# Patient Record
Sex: Male | Born: 1985 | Race: Black or African American | Hispanic: No | Marital: Single | Smoking: Current every day smoker
Health system: Southern US, Community
[De-identification: ages and names within clinical notes are randomized; demographics above are authoritative.]

## PROBLEM LIST (undated history)

## (undated) ENCOUNTER — Telehealth: Attending: Infectious Disease | Primary: Infectious Disease

## (undated) ENCOUNTER — Encounter: Attending: Family | Primary: Family

## (undated) ENCOUNTER — Ambulatory Visit: Payer: Medicaid (Managed Care)

## (undated) ENCOUNTER — Encounter

## (undated) ENCOUNTER — Telehealth: Attending: Clinical | Primary: Clinical

## (undated) ENCOUNTER — Encounter: Payer: BLUE CROSS/BLUE SHIELD | Attending: General Practice | Primary: General Practice

## (undated) ENCOUNTER — Encounter: Attending: Infectious Disease | Primary: Infectious Disease

## (undated) ENCOUNTER — Telehealth

## (undated) ENCOUNTER — Ambulatory Visit

## (undated) ENCOUNTER — Encounter: Attending: Pain Medicine | Primary: Pain Medicine

## (undated) ENCOUNTER — Encounter: Payer: BLUE CROSS/BLUE SHIELD | Attending: Infectious Disease | Primary: Infectious Disease

## (undated) ENCOUNTER — Ambulatory Visit: Payer: BLUE CROSS/BLUE SHIELD

## (undated) ENCOUNTER — Encounter: Payer: Medicaid (Managed Care) | Attending: Family | Primary: Family

## (undated) ENCOUNTER — Telehealth: Attending: Registered" | Primary: Registered"

## (undated) ENCOUNTER — Ambulatory Visit: Payer: BLUE CROSS/BLUE SHIELD | Attending: Infectious Disease | Primary: Infectious Disease

## (undated) ENCOUNTER — Encounter: Attending: Registered" | Primary: Registered"

## (undated) ENCOUNTER — Encounter: Payer: BLUE CROSS/BLUE SHIELD | Attending: Psychologist | Primary: Psychologist

## (undated) DIAGNOSIS — K219 Gastro-esophageal reflux disease without esophagitis: Secondary | ICD-10-CM

## (undated) DIAGNOSIS — F17210 Nicotine dependence, cigarettes, uncomplicated: Secondary | ICD-10-CM

## (undated) DIAGNOSIS — B20 Human immunodeficiency virus [HIV] disease: Secondary | ICD-10-CM

## (undated) DIAGNOSIS — R768 Other specified abnormal immunological findings in serum: Secondary | ICD-10-CM

## (undated) DIAGNOSIS — M222X9 Patellofemoral disorders, unspecified knee: Secondary | ICD-10-CM

## (undated) DIAGNOSIS — M3500 Sicca syndrome, unspecified: Secondary | ICD-10-CM

## (undated) DIAGNOSIS — G894 Chronic pain syndrome: Secondary | ICD-10-CM

## (undated) DIAGNOSIS — M5020 Other cervical disc displacement, unspecified cervical region: Secondary | ICD-10-CM

## (undated) DIAGNOSIS — G629 Polyneuropathy, unspecified: Secondary | ICD-10-CM

## (undated) HISTORY — DX: Sjogren syndrome, unspecified: M35.00

## (undated) HISTORY — PX: BACK SURGERY: SHX140

---
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
## 2009-06-22 ENCOUNTER — Emergency Department (HOSPITAL_COMMUNITY): Admission: EM | Admit: 2009-06-22 | Discharge: 2009-06-22 | Payer: Self-pay | Admitting: Emergency Medicine

## 2009-10-18 ENCOUNTER — Emergency Department (HOSPITAL_COMMUNITY): Admission: EM | Admit: 2009-10-18 | Discharge: 2009-10-18 | Payer: Self-pay | Admitting: Emergency Medicine

## 2009-11-28 ENCOUNTER — Emergency Department (HOSPITAL_COMMUNITY): Admission: EM | Admit: 2009-11-28 | Discharge: 2009-11-28 | Payer: Self-pay | Admitting: Emergency Medicine

## 2010-10-29 ENCOUNTER — Emergency Department (HOSPITAL_COMMUNITY)
Admission: EM | Admit: 2010-10-29 | Discharge: 2010-10-30 | Disposition: A | Discharge status: Home / Self Care | Payer: Self-pay | Attending: Emergency Medicine | Admitting: Emergency Medicine

## 2010-10-29 DIAGNOSIS — H53149 Visual discomfort, unspecified: Secondary | ICD-10-CM | POA: Insufficient documentation

## 2010-10-29 DIAGNOSIS — S058X9A Other injuries of unspecified eye and orbit, initial encounter: Secondary | ICD-10-CM | POA: Insufficient documentation

## 2010-10-29 DIAGNOSIS — X58XXXA Exposure to other specified factors, initial encounter: Secondary | ICD-10-CM | POA: Insufficient documentation

## 2010-10-29 DIAGNOSIS — H5710 Ocular pain, unspecified eye: Secondary | ICD-10-CM | POA: Insufficient documentation

## 2011-01-28 ENCOUNTER — Emergency Department (HOSPITAL_COMMUNITY)
Admission: EM | Admit: 2011-01-28 | Discharge: 2011-01-28 | Disposition: A | Discharge status: Home / Self Care | Payer: Self-pay | Attending: Emergency Medicine | Admitting: Emergency Medicine

## 2011-01-28 DIAGNOSIS — M542 Cervicalgia: Secondary | ICD-10-CM | POA: Insufficient documentation

## 2011-01-28 DIAGNOSIS — X58XXXA Exposure to other specified factors, initial encounter: Secondary | ICD-10-CM | POA: Insufficient documentation

## 2011-01-28 DIAGNOSIS — Y93K9 Activity, other involving animal care: Secondary | ICD-10-CM | POA: Insufficient documentation

## 2011-01-28 DIAGNOSIS — S139XXA Sprain of joints and ligaments of unspecified parts of neck, initial encounter: Secondary | ICD-10-CM | POA: Insufficient documentation

## 2011-02-07 ENCOUNTER — Emergency Department (HOSPITAL_COMMUNITY)
Admission: EM | Admit: 2011-02-07 | Discharge: 2011-02-07 | Disposition: A | Discharge status: Home / Self Care | Payer: Self-pay | Attending: Emergency Medicine | Admitting: Emergency Medicine

## 2011-02-07 DIAGNOSIS — S139XXA Sprain of joints and ligaments of unspecified parts of neck, initial encounter: Secondary | ICD-10-CM | POA: Insufficient documentation

## 2011-02-07 DIAGNOSIS — M542 Cervicalgia: Secondary | ICD-10-CM | POA: Insufficient documentation

## 2011-02-07 DIAGNOSIS — X58XXXA Exposure to other specified factors, initial encounter: Secondary | ICD-10-CM | POA: Insufficient documentation

## 2011-02-26 ENCOUNTER — Inpatient Hospital Stay (INDEPENDENT_AMBULATORY_CARE_PROVIDER_SITE_OTHER)
Admission: RE | Admit: 2011-02-26 | Discharge: 2011-02-26 | Disposition: A | Discharge status: Home / Self Care | Payer: Self-pay | Source: Ambulatory Visit | Attending: Family Medicine | Admitting: Family Medicine

## 2011-02-26 DIAGNOSIS — K5289 Other specified noninfective gastroenteritis and colitis: Secondary | ICD-10-CM

## 2011-03-07 ENCOUNTER — Inpatient Hospital Stay (INDEPENDENT_AMBULATORY_CARE_PROVIDER_SITE_OTHER)
Admission: RE | Admit: 2011-03-07 | Discharge: 2011-03-07 | Disposition: A | Discharge status: Home / Self Care | Payer: Self-pay | Source: Ambulatory Visit | Attending: Family Medicine | Admitting: Family Medicine

## 2011-03-07 DIAGNOSIS — K069 Disorder of gingiva and edentulous alveolar ridge, unspecified: Secondary | ICD-10-CM

## 2011-03-07 DIAGNOSIS — J069 Acute upper respiratory infection, unspecified: Secondary | ICD-10-CM

## 2011-03-09 ENCOUNTER — Emergency Department (HOSPITAL_COMMUNITY): Payer: Self-pay

## 2011-03-09 ENCOUNTER — Emergency Department (HOSPITAL_COMMUNITY)
Admission: EM | Admit: 2011-03-09 | Discharge: 2011-03-09 | Disposition: A | Discharge status: Home / Self Care | Payer: Self-pay | Attending: Internal Medicine | Admitting: Internal Medicine

## 2011-03-09 DIAGNOSIS — R5383 Other fatigue: Secondary | ICD-10-CM | POA: Insufficient documentation

## 2011-03-09 DIAGNOSIS — R059 Cough, unspecified: Secondary | ICD-10-CM | POA: Insufficient documentation

## 2011-03-09 DIAGNOSIS — R509 Fever, unspecified: Secondary | ICD-10-CM | POA: Insufficient documentation

## 2011-03-09 DIAGNOSIS — R51 Headache: Secondary | ICD-10-CM | POA: Insufficient documentation

## 2011-03-09 DIAGNOSIS — R5381 Other malaise: Secondary | ICD-10-CM | POA: Insufficient documentation

## 2011-03-09 DIAGNOSIS — B9789 Other viral agents as the cause of diseases classified elsewhere: Secondary | ICD-10-CM | POA: Insufficient documentation

## 2011-03-09 DIAGNOSIS — R05 Cough: Secondary | ICD-10-CM | POA: Insufficient documentation

## 2011-03-09 LAB — DIFFERENTIAL
Band Neutrophils: 0 % (ref 0–10)
Basophils Absolute: 0 10*3/uL (ref 0.0–0.1)
Basophils Relative: 0 % (ref 0–1)
Blasts: 0 %
Eosinophils Absolute: 0 10*3/uL (ref 0.0–0.7)
Eosinophils Relative: 0 % (ref 0–5)
Lymphs Abs: 4.4 10*3/uL — ABNORMAL HIGH (ref 0.7–4.0)
Metamyelocytes Relative: 0 %
Monocytes Absolute: 1 10*3/uL (ref 0.1–1.0)
Monocytes Relative: 12 % (ref 3–12)

## 2011-03-09 LAB — BASIC METABOLIC PANEL
Calcium: 8.4 mg/dL (ref 8.4–10.5)
GFR calc Af Amer: >90 mL/min (ref >90)
GFR calc non Af Amer: >90 mL/min (ref >90)
Potassium: 3.9 mEq/L (ref 3.5–5.1)
Sodium: 133 mEq/L — ABNORMAL LOW (ref 135–145)

## 2011-03-09 LAB — URINALYSIS, ROUTINE W REFLEX MICROSCOPIC
Hgb urine dipstick: NEGATIVE
Ketones, ur: NEGATIVE mg/dL
Protein, ur: NEGATIVE mg/dL
Urobilinogen, UA: 1 mg/dL (ref 0.0–1.0)

## 2011-03-09 LAB — POCT I-STAT TROPONIN I: Troponin i, poc: 0 ng/mL (ref 0.00–0.08)

## 2011-03-09 LAB — CBC
MCH: 25.9 pg — ABNORMAL LOW (ref 26.0–34.0)
MCHC: 33.2 g/dL (ref 30.0–36.0)
Platelets: 220 10*3/uL (ref 150–400)
RDW: 13.3 % (ref 11.5–15.5)

## 2011-05-31 DIAGNOSIS — B20 Human immunodeficiency virus [HIV] disease: Secondary | ICD-10-CM

## 2011-05-31 HISTORY — DX: Human immunodeficiency virus (HIV) disease: B20

## 2011-10-25 DIAGNOSIS — B20 Human immunodeficiency virus [HIV] disease: Secondary | ICD-10-CM | POA: Insufficient documentation

## 2012-01-20 DIAGNOSIS — M25569 Pain in unspecified knee: Secondary | ICD-10-CM | POA: Insufficient documentation

## 2012-05-25 ENCOUNTER — Emergency Department (HOSPITAL_COMMUNITY)
Admission: EM | Admit: 2012-05-25 | Discharge: 2012-05-25 | Disposition: A | Discharge status: Home / Self Care | Payer: Self-pay | Attending: Emergency Medicine | Admitting: Emergency Medicine

## 2012-05-25 ENCOUNTER — Encounter (HOSPITAL_COMMUNITY): Payer: Self-pay | Admitting: Emergency Medicine

## 2012-05-25 DIAGNOSIS — K029 Dental caries, unspecified: Secondary | ICD-10-CM | POA: Insufficient documentation

## 2012-05-25 DIAGNOSIS — F17200 Nicotine dependence, unspecified, uncomplicated: Secondary | ICD-10-CM | POA: Insufficient documentation

## 2012-05-25 DIAGNOSIS — K1370 Unspecified lesions of oral mucosa: Secondary | ICD-10-CM | POA: Insufficient documentation

## 2012-05-25 DIAGNOSIS — Z8739 Personal history of other diseases of the musculoskeletal system and connective tissue: Secondary | ICD-10-CM | POA: Insufficient documentation

## 2012-05-25 DIAGNOSIS — K1379 Other lesions of oral mucosa: Secondary | ICD-10-CM

## 2012-05-25 DIAGNOSIS — B20 Human immunodeficiency virus [HIV] disease: Secondary | ICD-10-CM | POA: Insufficient documentation

## 2012-05-25 HISTORY — DX: Patellofemoral disorders, unspecified knee: M22.2X9

## 2012-05-25 HISTORY — DX: Human immunodeficiency virus (HIV) disease: B20

## 2012-05-25 MED ORDER — IBUPROFEN 200 MG PO TABS
600.0000 mg | ORAL_TABLET | Freq: Once | ORAL | Status: AC
  Administered 2012-05-25: 600 mg via ORAL
  Filled 2012-05-25: qty 3

## 2012-05-25 MED ORDER — CHLORHEXIDINE GLUCONATE 0.12 % MT SOLN: 15.0000 mL | Freq: Two times a day (BID) | OROMUCOSAL | Status: DC

## 2012-05-25 MED ORDER — AMOXICILLIN 500 MG PO CAPS: 500.0000 mg | ORAL_CAPSULE | Freq: Three times a day (TID) | ORAL | Status: DC

## 2012-05-25 MED ORDER — IBUPROFEN 600 MG PO TABS: 600.0000 mg | ORAL_TABLET | Freq: Four times a day (QID) | ORAL | Status: DC | PRN

## 2012-05-25 NOTE — ED Notes (Signed)
Pt c/o gum burning, swelling x 2 weeks. Pt states he has had this in past was tx for inflammation and infection. No open lesions at this time.

## 2012-05-25 NOTE — ED Provider Notes (Signed)
History     CSN: [id number]  Arrival date & time 05/25/12  2044   First MD Initiated Contact with Patient 05/25/12 2249      Chief Complaint  Patient presents with  . Mouth Lesions    (Consider location/radiation/quality/duration/timing/severity/associated sxs/prior treatment) HPI Comments: Patient with history of HIV, on HAART, last CD4 count per the patient greater than 500 -- presents with complaint of burning gums and mild gingival swelling for the past week. Patient states had this twice in the past and has been prescribed amoxicillin, ibuprofen, and mouthwash which has helped his symptoms. His last episode was 4 months ago he was seen by a dentist at that time. Patient denies fever, trouble swallowing, neck swelling. Onset gradual. Course is constant. Nothing makes symptoms better or worse. No treatments prior to arrival  The history is provided by the patient.    Past Medical History  Diagnosis Date  . HIV disease   . Patella-femoral syndrome     History reviewed. No pertinent past surgical history.  No family history on file.  History  Substance Use Topics  . Smoking status: Current Every Day Smoker  . Smokeless tobacco: Not on file  . Alcohol Use: Yes     Comment: occasional      Review of Systems  Constitutional: Negative for fever.  HENT: Positive for dental problem. Negative for ear pain, sore throat, facial swelling, trouble swallowing and neck pain.   Respiratory: Negative for shortness of breath and stridor.   Skin: Negative for color change.  Neurological: Negative for headaches.    Allergies  Review of patient's allergies indicates no known allergies.  Home Medications   Current Outpatient Rx  Name  Route  Sig  Dispense  Refill  . ELVITEG-COBICIS-EMTRICIT-TENOF 150-150-200-300 MG PO TABS   Oral   Take 1 tablet by mouth at bedtime.           BP 138/65  Pulse 89  Temp 99.5 F (37.5 C) (Oral)  Resp 18  Ht 5\' 9"  (1.753 m)  Wt 123 lb  (55.792 kg)  BMI 18.16 kg/m2  SpO2 100%  Physical Exam  Nursing note and vitals reviewed. Constitutional: He appears well-developed and well-nourished.  HENT:  Head: Normocephalic and atraumatic. No trismus in the jaw.  Right Ear: Tympanic membrane, external ear and ear canal normal.  Left Ear: Tympanic membrane, external ear and ear canal normal.  Nose: Nose normal.  Mouth/Throat: Uvula is midline, oropharynx is clear and moist and mucous membranes are normal. Abnormal dentition. Dental caries present. No dental abscesses or uvula swelling. No tonsillar abscesses.       No swelling noted on exam. Mild gingival erythema generalized.  Eyes: Pupils are equal, round, and reactive to light.  Neck: Normal range of motion. Neck supple.       No neck swelling or Lugwig's angina  Neurological: He is alert.  Skin: Skin is warm and dry.  Psychiatric: He has a normal mood and affect.    ED Course  Procedures (including critical care time)  Labs Reviewed - No data to display No results found.   1. Mouth pain     11:07 PM Patient seen and examined. Medications ordered.   Vital signs reviewed and are as follows: Filed Vitals:   05/25/12 2116  BP: 138/65  Pulse: 89  Temp: 99.5 F (37.5 C)  Resp: 18   Urged dental f/u.   Patient urged to return with worsening symptoms or other concerns. Patient verbalized  understanding and agrees with plan.   MDM  Patient with gum pain.  CD4 count per patient is >500. No gross abscess.  Exam unconcerning for Ludwig's angina or other deep tissue infection in neck.  Will treat with amox and NSAID as this has helped before.  Urged patient to follow-up with dentist.           Renne Crigler, PA 05/25/12 2324

## 2012-05-26 NOTE — ED Provider Notes (Signed)
Medical screening examination/treatment/procedure(s) were performed by non-physician practitioner and as supervising physician I was immediately available for consultation/collaboration.    Starsky Nanna L Jarvis Knodel, MD 05/26/12 1509 

## 2012-09-01 ENCOUNTER — Emergency Department (HOSPITAL_COMMUNITY)
Admission: EM | Admit: 2012-09-01 | Discharge: 2012-09-01 | Disposition: A | Discharge status: Home / Self Care | Payer: Self-pay | Attending: Emergency Medicine | Admitting: Emergency Medicine

## 2012-09-01 ENCOUNTER — Encounter (HOSPITAL_COMMUNITY): Payer: Self-pay | Admitting: Emergency Medicine

## 2012-09-01 DIAGNOSIS — F17200 Nicotine dependence, unspecified, uncomplicated: Secondary | ICD-10-CM | POA: Insufficient documentation

## 2012-09-01 DIAGNOSIS — Z79899 Other long term (current) drug therapy: Secondary | ICD-10-CM | POA: Insufficient documentation

## 2012-09-01 DIAGNOSIS — Z8739 Personal history of other diseases of the musculoskeletal system and connective tissue: Secondary | ICD-10-CM | POA: Insufficient documentation

## 2012-09-01 DIAGNOSIS — Z21 Asymptomatic human immunodeficiency virus [HIV] infection status: Secondary | ICD-10-CM | POA: Insufficient documentation

## 2012-09-01 DIAGNOSIS — L84 Corns and callosities: Secondary | ICD-10-CM | POA: Insufficient documentation

## 2012-09-01 NOTE — ED Notes (Signed)
Pain on bottom of 1st toe r/foot

## 2012-09-01 NOTE — ED Provider Notes (Signed)
History    This chart was scribed for non-physician practitioner working with Nelia Shi, MD by Frederik Pear, ED Scribe. This patient was seen in room WTR9/WTR9 and the patient's care was started at 1603.   CSN: [id number]  Arrival date & time 09/01/12  1446   First MD Initiated Contact with Patient 09/01/12 1603      Chief Complaint  Patient presents with  . Toe Pain    (Consider location/radiation/quality/duration/timing/severity/associated sxs/prior treatment) The history is provided by the patient and medical records. No language interpreter was used.    Cody Villarreal is a 27 y.o. male who presents to the Emergency Department complaining of gradual onset, gradually worsening, burning, non-radiating right great toe pain that is aggravated by walking and alleviated by nothing that began more than a month ago. He denies any drainage to the area. He reports that he was told that he had a plantar's wart on his foot and used Dr. Claude Manges Away twice and callous pads with no relief. He has a h/o of HIV disease.   Past Medical History  Diagnosis Date  . HIV disease   . Patella-femoral syndrome     No past surgical history on file.  No family history on file.  History  Substance Use Topics  . Smoking status: Current Every Day Smoker  . Smokeless tobacco: Not on file  . Alcohol Use: Yes     Comment: occasional      Review of Systems  Constitutional: Negative for fever.  Respiratory: Negative for shortness of breath.   Gastrointestinal: Negative for nausea and vomiting.  Musculoskeletal:       Toe pain  Skin: Negative for color change and wound.    Allergies  Review of patient's allergies indicates no known allergies.  Home Medications   Current Outpatient Rx  Name  Route  Sig  Dispense  Refill  . elvitegravir-cobicistat-emtricitabine-tenofovir (STRIBILD) 150-150-200-300 MG TABS   Oral   Take 1 tablet by mouth at bedtime.           BP 111/72   Pulse 79  Temp(Src) 99.1 F (37.3 C) (Oral)  Resp 15  SpO2 100%  Physical Exam  Nursing note and vitals reviewed. Constitutional: He appears well-developed and well-nourished.  HENT:  Head: Normocephalic and atraumatic.  Neck: Normal range of motion. Neck supple.  Cardiovascular: Normal rate.   Pulmonary/Chest: Effort normal and breath sounds normal. No respiratory distress. He has no wheezes. He has no rales. He exhibits no tenderness.  Abdominal: There is no tenderness.  Musculoskeletal: Normal range of motion.  Neurological: He is alert. Coordination normal.  Skin: No erythema.  Thick calloused skin on the proximal side of the right great toe. No erythema, drainage, or signs of infection.  Psychiatric: He has a normal mood and affect. His behavior is normal. Judgment and thought content normal.    ED Course  Procedures (including critical care time)  DIAGNOSTIC STUDIES: Oxygen Saturation is 100% on room air, normal by my interpretation.    COORDINATION OF CARE:  16:20- Discussed planned course of treatment with the patient, including following up with a podiatrist, who is agreeable at this time.  Labs Reviewed - No data to display No results found.   1. Corn of toe     Patient seen and examined. Podiatry referrals given. Patient counseled on use of OTC medications.  Vital signs reviewed and are as follows: Filed Vitals:   09/01/12 1525  BP: 111/72  Pulse: 79  Temp: 99.1 F (37.3 C)  Resp: 15   Patient urged to return with worsening symptoms or other concerns. Patient verbalized understanding and agrees with plan.      MDM  Patient with corn of toe. No infection noted. Conservative management indicated with podiatry followup.  I personally performed the services described in this documentation, which was scribed in my presence. The recorded information has been reviewed and is accurate.        Renne Crigler, PA-C 09/01/12 [phone]

## 2012-09-01 NOTE — ED Notes (Signed)
Pt c/o R great toe pain. Pt states it feels like a burning pain when he walks on it. Pt states he was told he had a plantar's wart on his R great toe. Pt reports he has tried Dr. Orpha Bur on area, but it is not working. Pt ambulatory with steady gait to exam room. Pt arrives with companion.

## 2012-09-02 NOTE — ED Provider Notes (Signed)
Medical screening examination/treatment/procedure(s) were performed by non-physician practitioner and as supervising physician I was immediately available for consultation/collaboration.    Nelia Shi, MD 09/02/12 [phone]

## 2012-11-05 ENCOUNTER — Emergency Department (INDEPENDENT_AMBULATORY_CARE_PROVIDER_SITE_OTHER)
Admission: EM | Admit: 2012-11-05 | Discharge: 2012-11-05 | Disposition: A | Discharge status: Home / Self Care | Payer: Self-pay | Source: Home / Self Care | Attending: Family Medicine | Admitting: Family Medicine

## 2012-11-05 ENCOUNTER — Emergency Department (INDEPENDENT_AMBULATORY_CARE_PROVIDER_SITE_OTHER): Payer: Self-pay

## 2012-11-05 ENCOUNTER — Encounter (HOSPITAL_COMMUNITY): Payer: Self-pay | Admitting: *Deleted

## 2012-11-05 DIAGNOSIS — S6980XA Other specified injuries of unspecified wrist, hand and finger(s), initial encounter: Secondary | ICD-10-CM

## 2012-11-05 DIAGNOSIS — S6991XA Unspecified injury of right wrist, hand and finger(s), initial encounter: Secondary | ICD-10-CM

## 2012-11-05 DIAGNOSIS — H53149 Visual discomfort, unspecified: Secondary | ICD-10-CM

## 2012-11-05 MED ORDER — MUPIROCIN 2 % EX OINT: TOPICAL_OINTMENT | Freq: Three times a day (TID) | CUTANEOUS | Status: DC

## 2012-11-05 NOTE — ED Notes (Addendum)
Pt  Reports  He  Slammed  His  r   Index  Finger  In  Car  Door  About 1  Hr      Tet  Shot  Utd     No  Nailbed    Involvement

## 2012-11-05 NOTE — ED Provider Notes (Signed)
History     CSN: [id number]  Arrival date & time 11/05/12  1110   First MD Initiated Contact with Patient 11/05/12 1209      Chief Complaint  Patient presents with  . Finger Injury    (Consider location/radiation/quality/duration/timing/severity/associated sxs/prior treatment) HPI Comments: 27 year old male with history of HIV here complaining of an injury to his right index finger. Patient stated she slammed the car door on his index finger about one hour ago. Fingertip pad has a small cut and minimal bleeding. No nail injury.   Past Medical History  Diagnosis Date  . HIV disease   . Patella-femoral syndrome     History reviewed. No pertinent past surgical history.  History reviewed. No pertinent family history.  History  Substance Use Topics  . Smoking status: Current Every Day Smoker  . Smokeless tobacco: Not on file  . Alcohol Use: No     Comment: occasional      Review of Systems  Musculoskeletal:       Right finger tip injury as per history of present illness  Skin: Positive for wound.    Allergies  Review of patient's allergies indicates no known allergies.  Home Medications   Current Outpatient Rx  Name  Route  Sig  Dispense  Refill  . elvitegravir-cobicistat-emtricitabine-tenofovir (STRIBILD) 150-150-200-300 MG TABS   Oral   Take 1 tablet by mouth at bedtime.         . mupirocin ointment (BACTROBAN) 2 %   Topical   Apply topically 3 (three) times daily.   22 g   0     BP 123/79  Pulse 66  Temp(Src) 98.3 F (36.8 C) (Oral)  Resp 16  SpO2 100%  Physical Exam  Nursing note and vitals reviewed. Constitutional: He is oriented to person, place, and time. He appears well-developed and well-nourished.  Musculoskeletal:  Right index finger: Full range of motion of the and DIP, IP and MP joints. Normal flexion and extension passively and actively against resistance. Intact 2. discrimination and the entire finger.  Neurological: He is alert  and oriented to person, place, and time.  Skin:  Right index finger: There is a small about 3 mm vertical laceration in the static at the middle of the digital pad. There is no associated swelling or bruising. Nail appears intact no subungual hematoma.     ED Course  Procedures (including critical care time)  Labs Reviewed - No data to display Dg Finger Index Right  11/05/2012   *RADIOLOGY REPORT*  Clinical Data: Recent injury with pain  RIGHT INDEX FINGER 2+V  Comparison: None.  Findings: No acute fracture or dislocation is noted in the second digit.  No soft tissue abnormality is seen.  IMPRESSION: No acute abnormality noted.   Original Report Authenticated By: Alcide Clever, M.D.     1. Injury of tip of finger, right, initial encounter       MDM  Applied antibiotic ointment and dry dressing. Prescribed mupirocin ointment. Supportive care including wound care and red flags should prompt his return to medical attention discussed with patient and provided in writing.        Sharin Grave, MD 11/05/12 1330

## 2012-11-27 ENCOUNTER — Encounter (HOSPITAL_COMMUNITY): Payer: Self-pay | Admitting: Emergency Medicine

## 2012-11-27 ENCOUNTER — Emergency Department (HOSPITAL_COMMUNITY)
Admission: EM | Admit: 2012-11-27 | Discharge: 2012-11-27 | Disposition: A | Discharge status: Home / Self Care | Payer: Self-pay | Attending: Emergency Medicine | Admitting: Emergency Medicine

## 2012-11-27 ENCOUNTER — Emergency Department (HOSPITAL_COMMUNITY): Payer: Self-pay

## 2012-11-27 DIAGNOSIS — M25562 Pain in left knee: Secondary | ICD-10-CM

## 2012-11-27 DIAGNOSIS — B20 Human immunodeficiency virus [HIV] disease: Secondary | ICD-10-CM | POA: Insufficient documentation

## 2012-11-27 DIAGNOSIS — F17200 Nicotine dependence, unspecified, uncomplicated: Secondary | ICD-10-CM | POA: Insufficient documentation

## 2012-11-27 DIAGNOSIS — M25569 Pain in unspecified knee: Secondary | ICD-10-CM | POA: Insufficient documentation

## 2012-11-27 DIAGNOSIS — Z79899 Other long term (current) drug therapy: Secondary | ICD-10-CM | POA: Insufficient documentation

## 2012-11-27 DIAGNOSIS — M25561 Pain in right knee: Secondary | ICD-10-CM

## 2012-11-27 MED ORDER — KETOROLAC TROMETHAMINE 60 MG/2ML IM SOLN
60.0000 mg | Freq: Once | INTRAMUSCULAR | Status: AC
  Administered 2012-11-27: 60 mg via INTRAMUSCULAR
  Filled 2012-11-27: qty 2

## 2012-11-27 NOTE — ED Provider Notes (Signed)
History    CSN: [id number] Arrival date & time 11/27/12  1146  First MD Initiated Contact with Patient 11/27/12 1223     Chief Complaint  Patient presents with  . Knee Pain   (Consider location/radiation/quality/duration/timing/severity/associated sxs/prior Treatment) The history is provided by the patient.  Cody Villarreal is a 27 y.o. male hx of HIV on meds here with knee pain. He was diagnosed with patellar femoral syndrome in 2013 and went to physical therapy. Pain got worse for the last 3 weeks. No falls or injury. No fevers or chills. His orthopedic doctor is in [place] and he doesn't have anyone here. Not on pain meds.   Past Medical History  Diagnosis Date  . HIV disease   . Patella-femoral syndrome    History reviewed. No pertinent past surgical history. History reviewed. No pertinent family history. History  Substance Use Topics  . Smoking status: Current Every Day Smoker -- 0.50 packs/day    Types: Cigarettes  . Smokeless tobacco: Not on file  . Alcohol Use: No     Comment: occasional    Review of Systems  Musculoskeletal:       Bilateral knee pain   All other systems reviewed and are negative.    Allergies  Review of patient's allergies indicates no known allergies.  Home Medications   Current Outpatient Rx  Name  Route  Sig  Dispense  Refill  . elvitegravir-cobicistat-emtricitabine-tenofovir (STRIBILD) 150-150-200-300 MG TABS   Oral   Take 1 tablet by mouth at bedtime.         . mupirocin ointment (BACTROBAN) 2 %   Topical   Apply topically 3 (three) times daily.   22 g   0    BP 114/69  Pulse 55  Temp(Src) 98.2 F (36.8 C) (Oral)  Resp 16  Ht 5\' 9"  (1.753 m)  Wt 119 lb (53.978 kg)  BMI 17.57 kg/m2  SpO2 99% Physical Exam  Nursing note and vitals reviewed. Constitutional: He is oriented to person, place, and time. He appears well-developed and well-nourished.  HENT:  Head: Normocephalic.  Eyes: Pupils are equal, round, and  reactive to light.  Neck: Normal range of motion.  Cardiovascular: Normal rate.   Pulmonary/Chest: Effort normal.  Abdominal: Soft.  Musculoskeletal:  Bilateral knees mildly swollen, good reflexes. ACL/PCL intact. Nl ROM bilateral knees. Hips nontender. 2+ pulses.   Neurological: He is alert and oriented to person, place, and time.  Skin: Skin is warm and dry.  Psychiatric: He has a normal mood and affect. His behavior is normal. Judgment and thought content normal.    ED Course  Procedures (including critical care time) Labs Reviewed - No data to display Dg Knee Complete 4 Views Left  11/27/2012   *RADIOLOGY REPORT*  Clinical Data: Knee pain.  LEFT KNEE - COMPLETE 4+ VIEW  Comparison: None.  Findings: There is no fracture, dislocation, arthritis, or joint effusion.  Benign bone island in the proximal tibia.  IMPRESSION: Normal exam.   Original Report Authenticated By: Francene Boyers, M.D.   Dg Knee Complete 4 Views Right  11/27/2012   *RADIOLOGY REPORT*  Clinical Data: Knee pain.  RIGHT KNEE - COMPLETE 4+ VIEW  Comparison: None.  Findings: The osseous structures are normal.  No joint effusion. No arthritis.  IMPRESSION: Normal exam.   Original Report Authenticated By: Francene Boyers, M.D.   No diagnosis found.  MDM  Cody Villarreal is a 26 y.o. male here with bilateral knee pain. Will give toradol and  get xray to r/o obvious bony deformity.   2:21 PM Xray unremarkable. Felt better with toradol. Will give him ortho f/u.   Richardean Canal, MD 11/27/12 [phone]

## 2012-11-27 NOTE — ED Notes (Signed)
Pt states that he was dx w/ patellar femoral syndrome in 2013.  States that he has been having bilateral leg pain x 3 wks.

## 2012-11-27 NOTE — ED Notes (Signed)
Pt states he is able to walk.  Hx of congenital knee deformity.  Denies injury to knees

## 2012-11-27 NOTE — Progress Notes (Signed)
pcp is Dr Jessy Oto in [place] per pt

## 2012-12-10 ENCOUNTER — Emergency Department (HOSPITAL_COMMUNITY)
Admission: EM | Admit: 2012-12-10 | Discharge: 2012-12-10 | Disposition: A | Discharge status: Home / Self Care | Payer: Self-pay | Attending: Emergency Medicine | Admitting: Emergency Medicine

## 2012-12-10 ENCOUNTER — Encounter (HOSPITAL_COMMUNITY): Payer: Self-pay | Admitting: *Deleted

## 2012-12-10 DIAGNOSIS — M549 Dorsalgia, unspecified: Secondary | ICD-10-CM | POA: Insufficient documentation

## 2012-12-10 DIAGNOSIS — M25569 Pain in unspecified knee: Secondary | ICD-10-CM | POA: Insufficient documentation

## 2012-12-10 DIAGNOSIS — G8929 Other chronic pain: Secondary | ICD-10-CM | POA: Insufficient documentation

## 2012-12-10 DIAGNOSIS — Z21 Asymptomatic human immunodeficiency virus [HIV] infection status: Secondary | ICD-10-CM | POA: Insufficient documentation

## 2012-12-10 DIAGNOSIS — Z8739 Personal history of other diseases of the musculoskeletal system and connective tissue: Secondary | ICD-10-CM | POA: Insufficient documentation

## 2012-12-10 DIAGNOSIS — F17200 Nicotine dependence, unspecified, uncomplicated: Secondary | ICD-10-CM | POA: Insufficient documentation

## 2012-12-10 DIAGNOSIS — Z79899 Other long term (current) drug therapy: Secondary | ICD-10-CM | POA: Insufficient documentation

## 2012-12-10 MED ORDER — DICLOFENAC SODIUM 50 MG PO TBEC
50.0000 mg | DELAYED_RELEASE_TABLET | Freq: Once | ORAL | Status: AC
  Administered 2012-12-10: 50 mg via ORAL
  Filled 2012-12-10: qty 1

## 2012-12-10 MED ORDER — DICLOFENAC SODIUM 50 MG PO TBEC: 50.0000 mg | DELAYED_RELEASE_TABLET | Freq: Two times a day (BID) | ORAL | Status: DC

## 2012-12-10 NOTE — ED Provider Notes (Signed)
   History    CSN: [id number] Arrival date & time 12/10/12  Cody Villarreal  First MD Initiated Contact with Patient 12/10/12 2130     Chief Complaint  Patient presents with  . Leg Pain   (Consider location/radiation/quality/duration/timing/severity/associated sxs/prior Treatment) HPI Comments: Patient has chronic bilateral knee pain, as well as upper back pain.  He is being followed locally by Dr. as scheduled MRI on Friday for suspected.  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  He, states he took this for 3, days to irritate his stomach, and has not taken medication since then  Patient is a 27 y.o. male presenting with leg pain. The history is provided by the patient.  Leg Pain Lower extremity pain location: Bilateral knees, and upper back. Pain details:    Quality:  Aching   Severity:  Moderate   Timing:  Constant   Progression:  Unable to specify Chronicity:  Chronic Dislocation: no   Foreign body present:  No foreign bodies Associated symptoms: back pain   Associated symptoms: no fever    Past Medical History  Diagnosis Date  . HIV disease   . Patella-femoral syndrome    History reviewed. No pertinent past surgical history. No family history on file. History  Substance Use Topics  . Smoking status: Current Every Day Smoker -- 0.50 packs/day    Types: Cigarettes  . Smokeless tobacco: Not on file  . Alcohol Use: No     Comment: occasional    Review of Systems  Constitutional: Negative for fever and chills.  Musculoskeletal: Positive for back pain. Negative for joint swelling and gait problem.  All other systems reviewed and are negative.    Allergies  Review of patient's allergies indicates no known allergies.  Home Medications   Current Outpatient Rx  Name  Route  Sig  Dispense  Refill  . elvitegravir-cobicistat-emtricitabine-tenofovir (STRIBILD) 150-150-200-300 MG TABS   Oral   Take 1  tablet by mouth at bedtime.         . diclofenac (VOLTAREN) 50 MG EC tablet   Oral   Take 1 tablet (50 mg total) by mouth 2 (two) times daily.   60 tablet   0    BP 106/59  Pulse 61  Temp(Src) 98.1 F (36.7 C) (Oral)  Resp 16  SpO2 99% Physical Exam  Nursing note and vitals reviewed. Constitutional: He appears well-developed and well-nourished.  HENT:  Head: Normocephalic.  Eyes: Pupils are equal, round, and reactive to light.  Neck: Normal range of motion.  Cardiovascular: Normal rate.   Pulmonary/Chest: Effort normal.  Musculoskeletal: He exhibits no edema and no tenderness.  When patient leans forward, right scapula, slightly, raised, no other obvious deformity  Skin: Skin is warm.    ED Course  Procedures (including critical care time) Labs Reviewed - No data to display No results found. 1. Chronic knee pain, left     MDM   Encourage patient to keep his appointment with Dr. August Saucer for his MRI.  I've written a prescription for Voltaren 50 mg twice a day hopefully, this is going to be easier on his stomach  Arman Filter, NP 12/10/12 2201  Arman Filter, NP 12/10/12 2201

## 2012-12-10 NOTE — ED Notes (Signed)
Pt c/o bilateral leg and lower back pain x 1 month; seen last wk for same and told to come back if no better

## 2012-12-14 NOTE — ED Provider Notes (Signed)
Medical screening examination/treatment/procedure(s) were performed by non-physician practitioner and as supervising physician I was immediately available for consultation/collaboration.   Laray Anger, DO 12/14/12 [phone]

## 2013-04-21 ENCOUNTER — Encounter (HOSPITAL_COMMUNITY): Payer: Self-pay | Admitting: Emergency Medicine

## 2013-04-21 ENCOUNTER — Emergency Department (HOSPITAL_COMMUNITY)
Admission: EM | Admit: 2013-04-21 | Discharge: 2013-04-21 | Disposition: A | Discharge status: Home / Self Care | Payer: Medicaid Other | Attending: Emergency Medicine | Admitting: Emergency Medicine

## 2013-04-21 DIAGNOSIS — Z8739 Personal history of other diseases of the musculoskeletal system and connective tissue: Secondary | ICD-10-CM | POA: Insufficient documentation

## 2013-04-21 DIAGNOSIS — M542 Cervicalgia: Secondary | ICD-10-CM | POA: Insufficient documentation

## 2013-04-21 DIAGNOSIS — F17200 Nicotine dependence, unspecified, uncomplicated: Secondary | ICD-10-CM | POA: Insufficient documentation

## 2013-04-21 DIAGNOSIS — Z79899 Other long term (current) drug therapy: Secondary | ICD-10-CM | POA: Insufficient documentation

## 2013-04-21 DIAGNOSIS — Z21 Asymptomatic human immunodeficiency virus [HIV] infection status: Secondary | ICD-10-CM | POA: Insufficient documentation

## 2013-04-21 MED ORDER — TRAMADOL HCL 50 MG PO TABS: 50.0000 mg | ORAL_TABLET | Freq: Four times a day (QID) | ORAL | Status: DC | PRN

## 2013-04-21 MED ORDER — DIAZEPAM 5 MG PO TABS: 5.0000 mg | ORAL_TABLET | Freq: Two times a day (BID) | ORAL | Status: DC

## 2013-04-21 NOTE — ED Provider Notes (Signed)
Medical screening examination/treatment/procedure(s) were performed by non-physician practitioner and as supervising physician I was immediately available for consultation/collaboration.  EKG Interpretation   None        Greenleigh Kauth F Nirvan Laban, MD 04/21/13 1233 

## 2013-04-21 NOTE — ED Notes (Signed)
Pt presents to ED with neck pain on the left side.As per pt he felt a lump on the left side of  The neck but disappeared .

## 2013-04-21 NOTE — ED Provider Notes (Signed)
CSN: [id number]     Arrival date & time 04/21/13  4098 History   First MD Initiated Contact with Patient 04/21/13 [phone]     Chief Complaint  Patient presents with  . Neck Pain   (Consider location/radiation/quality/duration/timing/severity/associated sxs/prior Treatment) HPI  Cody Villarreal is a 27 y.o. male with past medical history significant for HIV, compliant with his antiretrovirals, last CD4 count was around 500, viral load undetectable. Patient is complaining of a left-sided cervicalgia with muscle tension onset 2 days ago, exacerbated by head movement. Patient denies trauma, fever, weakness, history of IV drug use. Patient is taking no pain medication prior to arrival. Rates his pain at 8/10. 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   Past Medical History  Diagnosis Date  . HIV disease   . Patella-femoral syndrome    History reviewed. No pertinent past surgical history. No family history on file. History  Substance Use Topics  . Smoking status: Current Every Day Smoker -- 0.50 packs/day    Types: Cigarettes  . Smokeless tobacco: Not on file  . Alcohol Use: No     Comment: occasional    Review of Systems 10 systems reviewed and found to be negative, except as noted in the HPI  Allergies  Review of patient's allergies indicates no known allergies.  Home Medications   Current Outpatient Rx  Name  Route  Sig  Dispense  Refill  . diazepam (VALIUM) 5 MG tablet   Oral   Take 1 tablet (5 mg total) by mouth 2 (two) times daily.   10 tablet   0   . diclofenac (VOLTAREN) 50 MG EC tablet   Oral   Take 1 tablet (50 mg total) by mouth 2 (two) times daily.   60 tablet   0   . elvitegravir-cobicistat-emtricitabine-tenofovir (STRIBILD) 150-150-200-300 MG TABS   Oral   Take 1 tablet by mouth at bedtime.         . traMADol (ULTRAM) 50 MG tablet   Oral   Take 1 tablet (50 mg total) by mouth every 6 (six) hours  as needed.   15 tablet   0    BP 136/76  Pulse 95  Temp(Src) 98.9 F (37.2 C) (Oral)  Resp 16  SpO2 98% Physical Exam  Nursing note and vitals reviewed. Constitutional: He is oriented to person, place, and time. He appears well-developed and well-nourished. No distress.  HENT:  Head: Normocephalic.  Mouth/Throat: Oropharynx is clear and moist.  Eyes: Conjunctivae and EOM are normal. Pupils are equal, round, and reactive to light.  Neck: Neck supple.  No midline tenderness to percussion. Patient has left-sided paracervical spasm with tenderness to palpation. Mildly reduced range of motion. Strength to the upper extremities is 5 out of 5, sensation normal.   Cardiovascular: Normal rate.   Pulmonary/Chest: Effort normal. No stridor.  Musculoskeletal: Normal range of motion.  Neurological: He is alert and oriented to person, place, and time.  Psychiatric: He has a normal mood and affect.    ED Course  Procedures (including critical care time) Labs Review Labs Reviewed - No data to display Imaging Review No results found.  EKG Interpretation   None       MDM   1. Cervicalgia     Filed Vitals:   04/21/13 0617 04/21/13 0714  BP: 108/75 136/76  Pulse: 112 95  Temp: 97.9 F (36.6 C) 98.9 F (37.2 C)  TempSrc: Oral Oral  Resp: 18 16  SpO2: 97% 98%     Cody Villarreal is a 27 y.o. male presenting with atraumatic left-sided torticollis. No midline tenderness to palpation, no fevers reported. CD4 count is around 500. Neuro exam is benign focal and strength to the upper extremities is equal bilaterally.  Medications - No data to display  Pt is hemodynamically stable, appropriate for, and amenable to discharge at this time. Pt verbalized understanding and agrees with care plan. All questions answered. Outpatient follow-up and specific return precautions discussed.    Discharge Medication List as of 04/21/2013  6:59 AM    START taking these medications   Details   diazepam (VALIUM) 5 MG tablet Take 1 tablet (5 mg total) by mouth 2 (two) times daily., Starting 04/21/2013, Until Discontinued, Print    traMADol (ULTRAM) 50 MG tablet Take 1 tablet (50 mg total) by mouth every 6 (six) hours as needed., Starting 04/21/2013, Until Discontinued, Print        Note: Portions of this report may have been transcribed using voice recognition software. Every effort was made to ensure accuracy; however, inadvertent computerized transcription errors may be present      Wynetta Emery, PA-C 04/21/13 0827

## 2013-09-18 DIAGNOSIS — F17200 Nicotine dependence, unspecified, uncomplicated: Secondary | ICD-10-CM | POA: Insufficient documentation

## 2013-12-27 IMAGING — CR DG KNEE COMPLETE 4+V*R*
4 series · 4 of 4 positions shown · non-contrast
Comparison: None.

CLINICAL DATA: Knee pain.

RIGHT KNEE - COMPLETE 4+ VIEW

[x knee lat right]
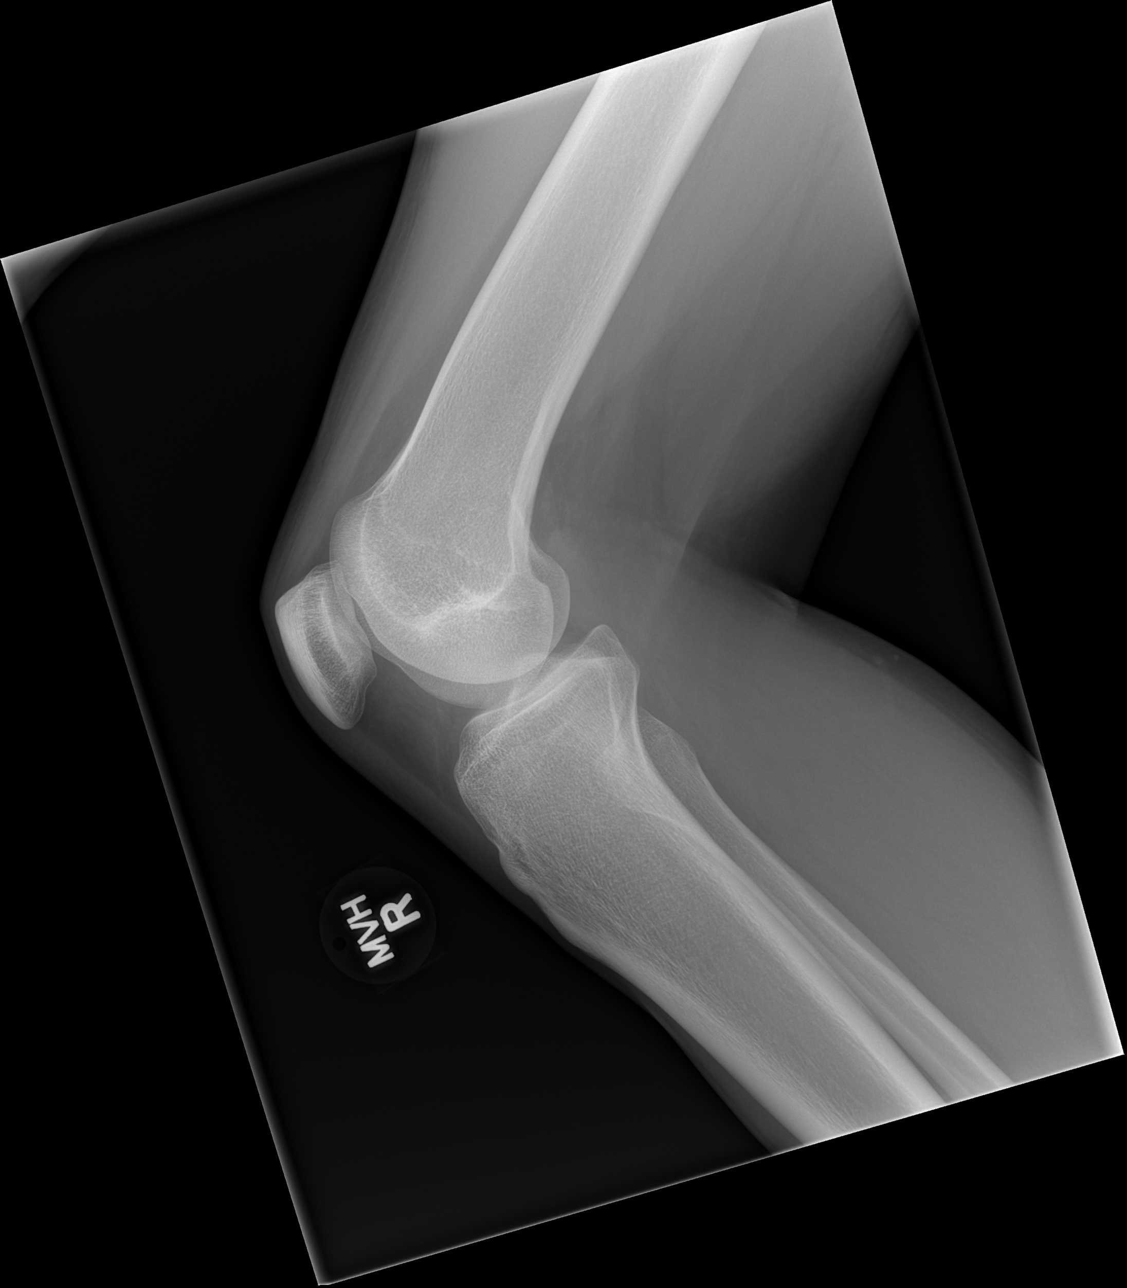

[x knee ap right (1 of 3)]
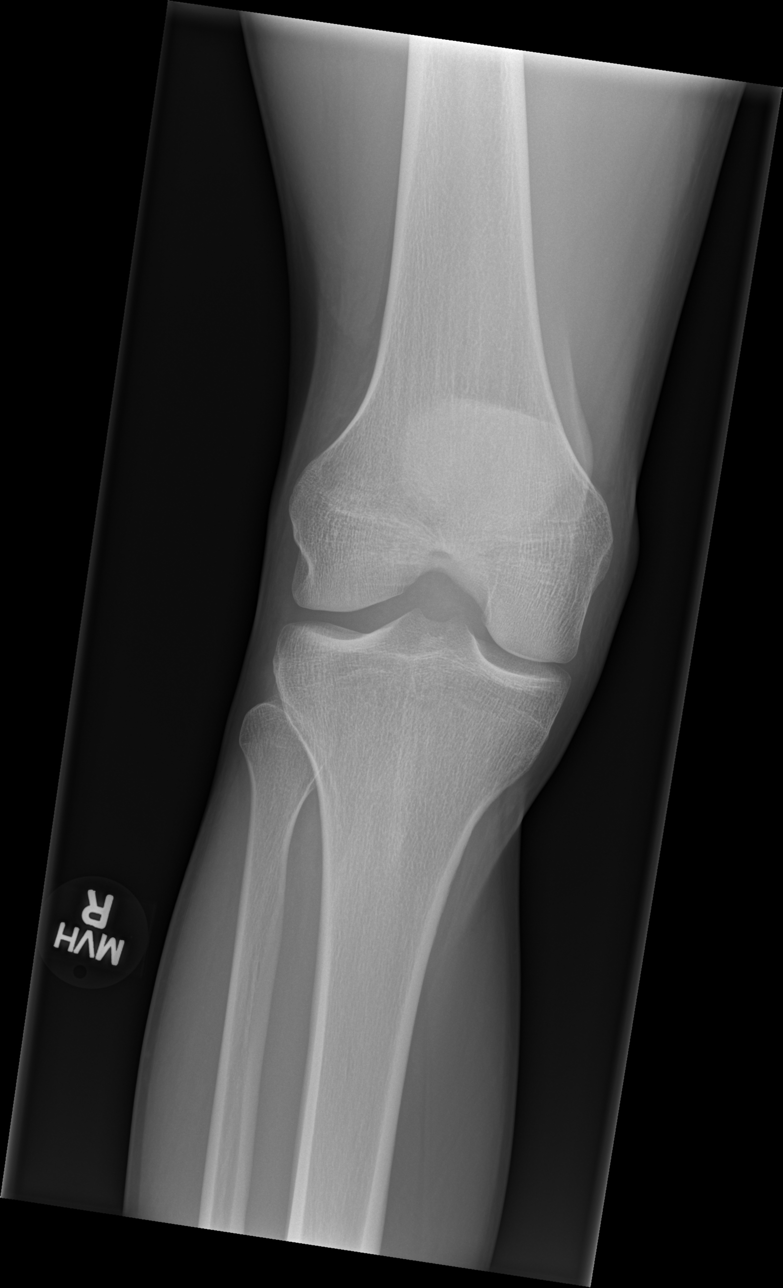

[x knee ap right (2 of 3)]
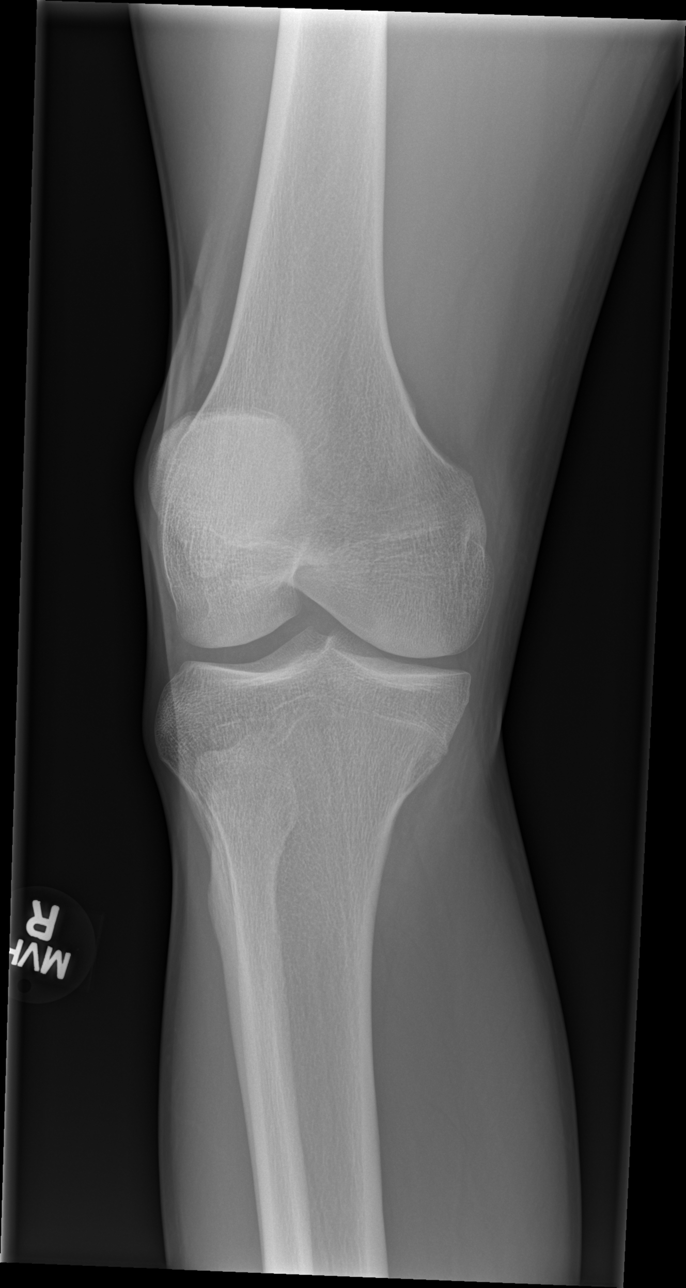

[x knee ap right (3 of 3)]
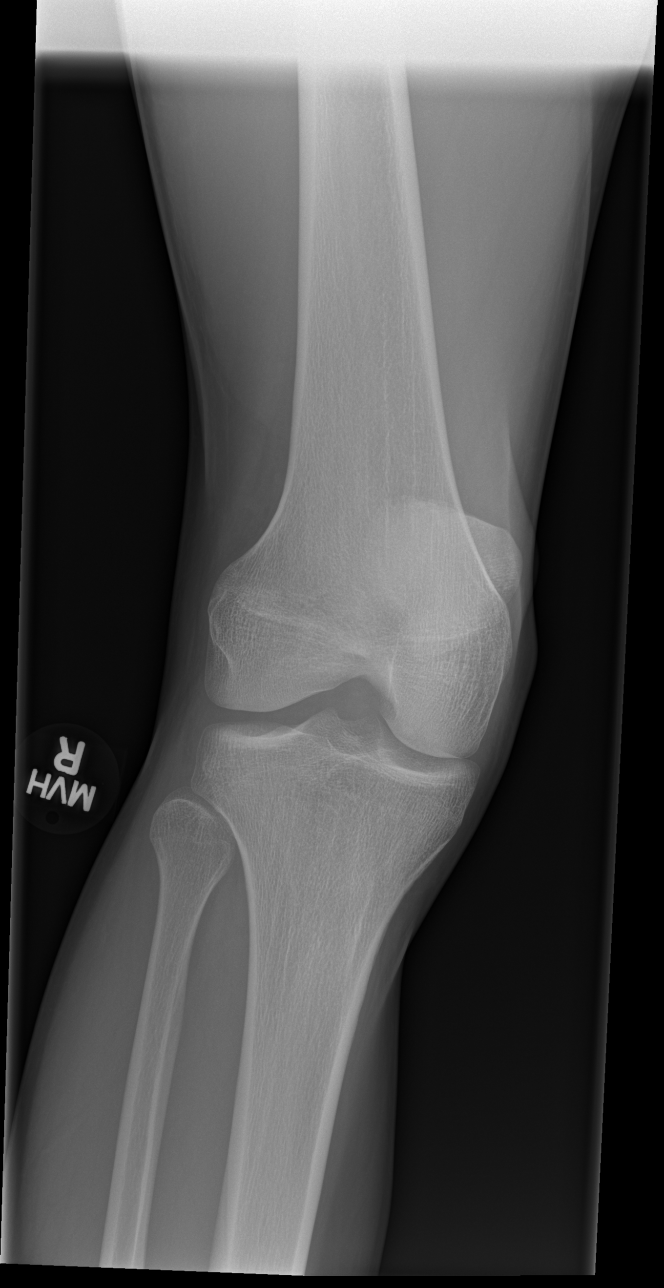

[4 of 4 positions shown; findings below may reference images not displayed]

FINDINGS: The osseous structures are normal.  No joint effusion.
No arthritis.
IMPRESSION: Normal exam.

## 2014-01-15 ENCOUNTER — Encounter (HOSPITAL_COMMUNITY): Payer: Self-pay | Admitting: Emergency Medicine

## 2014-01-15 ENCOUNTER — Emergency Department (INDEPENDENT_AMBULATORY_CARE_PROVIDER_SITE_OTHER): Payer: Medicaid Other

## 2014-01-15 ENCOUNTER — Emergency Department (HOSPITAL_COMMUNITY)
Admission: EM | Admit: 2014-01-15 | Discharge: 2014-01-15 | Disposition: A | Discharge status: Home / Self Care | Payer: Medicaid Other | Source: Home / Self Care | Attending: Emergency Medicine | Admitting: Emergency Medicine

## 2014-01-15 DIAGNOSIS — J209 Acute bronchitis, unspecified: Secondary | ICD-10-CM

## 2014-01-15 DIAGNOSIS — J4520 Mild intermittent asthma, uncomplicated: Secondary | ICD-10-CM

## 2014-01-15 MED ORDER — PREDNISONE 20 MG PO TABS: 20.0000 mg | ORAL_TABLET | Freq: Two times a day (BID) | ORAL | Status: DC

## 2014-01-15 MED ORDER — ALBUTEROL SULFATE HFA 108 (90 BASE) MCG/ACT IN AERS: 1.0000 | INHALATION_SPRAY | Freq: Four times a day (QID) | RESPIRATORY_TRACT | Status: DC | PRN

## 2014-01-15 MED ORDER — AMOXICILLIN 500 MG PO CAPS: 1000.0000 mg | ORAL_CAPSULE | Freq: Three times a day (TID) | ORAL | Status: DC

## 2014-01-15 MED ORDER — IPRATROPIUM BROMIDE 0.06 % NA SOLN: 2.0000 | Freq: Four times a day (QID) | NASAL | Status: DC

## 2014-01-15 MED ORDER — GUAIFENESIN-CODEINE 100-10 MG/5ML PO SYRP: 5.0000 mL | ORAL_SOLUTION | Freq: Three times a day (TID) | ORAL | Status: DC | PRN

## 2014-01-15 NOTE — ED Notes (Signed)
C/o cold sx onset 6 days Sx include: productive cough, chest "burning", vomiting due to cough, decreased appetite, SOB, runny nose Denies f/n/d Taking Nyquil/theraflu/alka seltzer w/no relief Alert, no signs of acute distress.

## 2014-01-15 NOTE — Discharge Instructions (Signed)
Acute Bronchitis °Bronchitis is inflammation of the airways that extend from the windpipe into the lungs (bronchi). The inflammation often causes mucus to develop. This leads to a cough, which is the most common symptom of bronchitis.  °In acute bronchitis, the condition usually develops suddenly and goes away over time, usually in a couple weeks. Smoking, allergies, and asthma can make bronchitis worse. Repeated episodes of bronchitis may cause further lung problems.  °CAUSES °Acute bronchitis is most often caused by the same virus that causes a cold. The virus can spread from person to person (contagious) through coughing, sneezing, and touching contaminated objects. °SIGNS AND SYMPTOMS  °· Cough.   °· Fever.   °· Coughing up mucus.   °· Body aches.   °· Chest congestion.   °· Chills.   °· Shortness of breath.   °· Sore throat.   °DIAGNOSIS  °Acute bronchitis is usually diagnosed through a physical exam. Your health care provider will also ask you questions about your medical history. Tests, such as chest X-rays, are sometimes done to rule out other conditions.  °TREATMENT  °Acute bronchitis usually goes away in a couple weeks. Oftentimes, no medical treatment is necessary. Medicines are sometimes given for relief of fever or cough. Antibiotic medicines are usually not needed but may be prescribed in certain situations. In some cases, an inhaler may be recommended to help reduce shortness of breath and control the cough. A cool mist vaporizer may also be used to help thin bronchial secretions and make it easier to clear the chest.  °HOME CARE INSTRUCTIONS °· Get plenty of rest.   °· Drink enough fluids to keep your urine clear or pale yellow (unless you have a medical condition that requires fluid restriction). Increasing fluids may help thin your respiratory secretions (sputum) and reduce chest congestion, and it will prevent dehydration.   °· Take medicines only as directed by your health care provider. °· If  you were prescribed an antibiotic medicine, finish it all even if you start to feel better. °· Avoid smoking and secondhand smoke. Exposure to cigarette smoke or irritating chemicals will make bronchitis worse. If you are a smoker, consider using nicotine gum or skin patches to help control withdrawal symptoms. Quitting smoking will help your lungs heal faster.   °· Reduce the chances of another bout of acute bronchitis by washing your hands frequently, avoiding people with cold symptoms, and trying not to touch your hands to your mouth, nose, or eyes.   °· Keep all follow-up visits as directed by your health care provider.   °SEEK MEDICAL CARE IF: °Your symptoms do not improve after 1 week of treatment.  °SEEK IMMEDIATE MEDICAL CARE IF: °· You develop an increased fever or chills.   °· You have chest pain.   °· You have severe shortness of breath. °· You have bloody sputum.   °· You develop dehydration. °· You faint or repeatedly feel like you are going to pass out. °· You develop repeated vomiting. °· You develop a severe headache. °MAKE SURE YOU:  °· Understand these instructions. °· Will watch your condition. °· Will get help right away if you are not doing well or get worse. °Document Released: 06/23/2004 Document Revised: 09/30/2013 Document Reviewed: 11/06/2012 °ExitCare® Patient Information ©2015 [employer]. This information is not intended to replace advice given to you by your health care provider. Make sure you discuss any questions you have with your health care provider. ° °How to Use an Inhaler °Proper inhaler technique is very important. Good technique ensures that the medicine reaches the lungs. Poor technique results   in depositing the medicine on the tongue and back of the throat rather than in the airways. If you do not use the inhaler with good technique, the medicine will not help you. STEPS TO FOLLOW IF USING AN INHALER WITHOUT AN EXTENSION TUBE 1. Remove the cap from the inhaler. 2. If  you are using the inhaler for the first time, you will need to prime it. Shake the inhaler for 5 seconds and release four puffs into the air, away from your face. Ask your health care provider or pharmacist if you have questions about priming your inhaler. 3. Shake the inhaler for 5 seconds before each breath in (inhalation). 4. Position the inhaler so that the top of the canister faces up. 5. Put your index finger on the top of the medicine canister. Your thumb supports the bottom of the inhaler. 6. Open your mouth. 7. Either place the inhaler between your teeth and place your lips tightly around the mouthpiece, or hold the inhaler 1-2 inches away from your open mouth. If you are unsure of which technique to use, ask your health care provider. 8. Breathe out (exhale) normally and as completely as possible. 9. Press the canister down with your index finger to release the medicine. 10. At the same time as the canister is pressed, inhale deeply and slowly until your lungs are completely filled. This should take 4-6 seconds. Keep your tongue down. 11. Hold the medicine in your lungs for 5-10 seconds (10 seconds is best). This helps the medicine get into the small airways of your lungs. 12. Breathe out slowly, through pursed lips. Whistling is an example of pursed lips. 13. Wait at least 15-30 seconds between puffs. Continue with the above steps until you have taken the number of puffs your health care provider has ordered. Do not use the inhaler more than your health care provider tells you. 14. Replace the cap on the inhaler. 15. Follow the directions from your health care provider or the inhaler insert for cleaning the inhaler. STEPS TO FOLLOW IF USING AN INHALER WITH AN EXTENSION (SPACER) 1. Remove the cap from the inhaler. 2. If you are using the inhaler for the first time, you will need to prime it. Shake the inhaler for 5 seconds and release four puffs into the air, away from your face. Ask your  health care provider or pharmacist if you have questions about priming your inhaler. 3. Shake the inhaler for 5 seconds before each breath in (inhalation). 4. Place the open end of the spacer onto the mouthpiece of the inhaler. 5. Position the inhaler so that the top of the canister faces up and the spacer mouthpiece faces you. 6. Put your index finger on the top of the medicine canister. Your thumb supports the bottom of the inhaler and the spacer. 7. Breathe out (exhale) normally and as completely as possible. 8. Immediately after exhaling, place the spacer between your teeth and into your mouth. Close your lips tightly around the spacer. 9. Press the canister down with your index finger to release the medicine. 10. At the same time as the canister is pressed, inhale deeply and slowly until your lungs are completely filled. This should take 4-6 seconds. Keep your tongue down and out of the way. 11. Hold the medicine in your lungs for 5-10 seconds (10 seconds is best). This helps the medicine get into the small airways of your lungs. Exhale. 12. Repeat inhaling deeply through the spacer mouthpiece. Again hold that breath  for up to 10 seconds (10 seconds is best). Exhale slowly. If it is difficult to take this second deep breath through the spacer, breathe normally several times through the spacer. Remove the spacer from your mouth. 13. Wait at least 15-30 seconds between puffs. Continue with the above steps until you have taken the number of puffs your health care provider has ordered. Do not use the inhaler more than your health care provider tells you. 14. Remove the spacer from the inhaler, and place the cap on the inhaler. 15. Follow the directions from your health care provider or the inhaler insert for cleaning the inhaler and spacer. If you are using different kinds of inhalers, use your quick relief medicine to open the airways 10-15 minutes before using a steroid if instructed to do so by  your health care provider. If you are unsure which inhalers to use and the order of using them, ask your health care provider, nurse, or respiratory therapist. If you are using a steroid inhaler, always rinse your mouth with water after your last puff, then gargle and spit out the water. Do not swallow the water. AVOID:  Inhaling before or after starting the spray of medicine. It takes practice to coordinate your breathing with triggering the spray.  Inhaling through the nose (rather than the mouth) when triggering the spray. HOW TO DETERMINE IF YOUR INHALER IS FULL OR NEARLY EMPTY You cannot know when an inhaler is empty by shaking it. A few inhalers are now being made with dose counters. Ask your health care provider for a prescription that has a dose counter if you feel you need that extra help. If your inhaler does not have a counter, ask your health care provider to help you determine the date you need to refill your inhaler. Write the refill date on a calendar or your inhaler canister. Refill your inhaler 7-10 days before it runs out. Be sure to keep an adequate supply of medicine. This includes making sure it is not expired, and that you have a spare inhaler.  SEEK MEDICAL CARE IF:   Your symptoms are only partially relieved with your inhaler.  You are having trouble using your inhaler.  You have some increase in phlegm. SEEK IMMEDIATE MEDICAL CARE IF:   You feel little or no relief with your inhalers. You are still wheezing and are feeling shortness of breath or tightness in your chest or both.  You have dizziness, headaches, or a fast heart rate.  You have chills, fever, or night sweats.  You have a noticeable increase in phlegm production, or there is blood in the phlegm. MAKE SURE YOU:   Understand these instructions.  Will watch your condition.  Will get help right away if you are not doing well or get worse. Document Released: 05/13/2000 Document Revised: 03/06/2013  Document Reviewed: 12/13/2012 [facility] Patient Information 2015 [place], [place]. This information is not intended to replace advice given to you by your health care provider. Make sure you discuss any questions you have with your health care provider.

## 2014-01-15 NOTE — ED Provider Notes (Signed)
Chief Complaint   Chief Complaint  Patient presents with  . 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 He denies any earache, sore throat, fever, chills, or GI symptoms. He's been exposed to his fiance who has had similar symptoms.  Review of Systems   Other than as noted above, the patient denies any of the following symptoms: Systemic:  No fevers, chills, sweats, or myalgias. Eye:  No redness or discharge. ENT:  No ear pain, headache, nasal congestion, drainage, sinus pressure, or sore throat. Neck:  No neck pain, stiffness, or swollen glands. Lungs:  No cough, sputum production, hemoptysis, wheezing, chest tightness, shortness of breath or chest pain. GI:  No abdominal pain, nausea, vomiting or diarrhea.  PMFSH   Past medical history, family history, social history, meds, and allergies were reviewed. He has no medication allergies. His only medication is Stribild. Current medical problems include HIV infection , and is followed by Dr. Wilson Singer in [place]. His last viral titer was undetectable.  Physical exam   Vital signs:  BP 116/83  Pulse 85  Temp(Src) 98.2 F (36.8 C) (Oral)  Resp 20  SpO2 99% General:  Alert and oriented.  In no distress.  Skin warm and dry. Eye:  No conjunctival injection or drainage. Lids were normal. ENT:  TMs and canals were normal, without erythema or inflammation.  Nasal mucosa was clear and uncongested, without drainage.  Mucous membranes were moist.  Pharynx was clear with no exudate or drainage.  There were no oral ulcerations or lesions. Neck:  Supple, no adenopathy, tenderness or mass. Lungs:  No respiratory distress.  Lungs were clear to auscultation, without wheezes, rales or rhonchi.  Breath sounds  were clear and equal bilaterally.  Heart:  Regular rhythm, without gallops, murmers or rubs. Skin:  Clear, warm, and dry, without rash or lesions.   Radiology   Dg Chest 2 View  01/15/2014   CLINICAL DATA:  Cough and chest pain with photophobia; history of tobacco use  EXAM: CHEST  2 VIEW  COMPARISON:  PA and lateral chest x-ray of March 09, 2011  FINDINGS: The lungs are mildly hyperinflated but clear. The heart and mediastinal structures are normal. There is no pleural effusion. There is stable gentle mid to lower curvature of the thoracic spine convex toward the right.  IMPRESSION: There is no evidence of pneumonia nor other acute cardiopulmonary abnormality. Mild hyperinflation may be voluntary or could reflect underlying reactive airway disease or early COPD.   Electronically Signed   By: Toribio Seiber  [place]   On: 01/15/2014 10:55   Assessment     The primary encounter diagnosis was Acute bronchitis, unspecified organism. A diagnosis of Reactive airway disease, mild intermittent, uncomplicated was also pertinent to this visit.  Plan    1.  Meds:  The following meds were prescribed:   Discharge Medication List as of 01/15/2014 11:09 AM    START taking these medications   Details  albuterol (PROVENTIL HFA;VENTOLIN HFA) 108 (90 BASE) MCG/ACT inhaler Inhale 1-2 puffs into the lungs every 6 (six) hours as needed for wheezing or shortness of breath., Starting 01/15/2014, Until Discontinued, Normal    amoxicillin (AMOXIL) 500 MG capsule Take 2 capsules (1,000 mg total) by mouth 3 (three) times daily., Starting 01/15/2014, Until  Discontinued, Normal    guaiFENesin-codeine (ROBITUSSIN AC) 100-10 MG/5ML syrup Take 5 mLs by mouth 3 (three) times daily as needed for cough., Starting 01/15/2014, Until Discontinued, Print    ipratropium (ATROVENT) 0.06 % nasal spray Place 2 sprays into both nostrils 4 (four) times daily., Starting 01/15/2014, Until Discontinued, Normal    predniSONE (DELTASONE) 20 MG  tablet Take 1 tablet (20 mg total) by mouth 2 (two) times daily., Starting 01/15/2014, Until Discontinued, Normal        2.  Patient Education/Counseling:  The patient was given appropriate handouts, self care instructions, and instructed in symptomatic relief.  Instructed to get extra fluids, rest, and use a cool mist vaporizer.    3.  Follow up:  The patient was told to follow up here if no better in 3 to 4 days, or sooner if becoming worse in any way, and given some red flag symptoms such as increasing fever, difficulty breathing, chest pain, or persistent vomiting which would prompt immediate return.  Follow up here as needed.      Reuben Likesavid C Evanie Buckle, MD 01/15/14 [id number]

## 2014-04-08 ENCOUNTER — Emergency Department (INDEPENDENT_AMBULATORY_CARE_PROVIDER_SITE_OTHER): Payer: Medicaid Other

## 2014-04-08 ENCOUNTER — Emergency Department (INDEPENDENT_AMBULATORY_CARE_PROVIDER_SITE_OTHER)
Admission: EM | Admit: 2014-04-08 | Discharge: 2014-04-08 | Disposition: A | Discharge status: Home / Self Care | Payer: Medicaid Other | Source: Home / Self Care | Attending: Family Medicine | Admitting: Family Medicine

## 2014-04-08 ENCOUNTER — Encounter (HOSPITAL_COMMUNITY): Payer: Self-pay | Admitting: Emergency Medicine

## 2014-04-08 DIAGNOSIS — S61011A Laceration without foreign body of right thumb without damage to nail, initial encounter: Secondary | ICD-10-CM

## 2014-04-08 MED ORDER — LIDOCAINE HCL (PF) 2 % IJ SOLN
INTRAMUSCULAR | Status: AC
  Filled 2014-04-08: qty 2

## 2014-04-08 NOTE — ED Notes (Signed)
Pt is here today for a laceration to his right thumb, pt said that he tripped over a shoe and fell into a window pane, the injury happened today

## 2014-04-08 NOTE — ED Provider Notes (Signed)
CSN: [id number]     Arrival date & time 04/08/14  1006 History   First MD Initiated Contact with Patient 04/08/14 1042     Chief Complaint  Patient presents with  . Extremity Laceration   (Consider location/radiation/quality/duration/timing/severity/associated sxs/prior Treatment)  HPI Patient is a 28 yo male presenting with laceration of right thumb. States he fell through a window this morning at 7 am. States the laceration is deep. Is still bleeding a small amount. States this hurts with movement of the thumb, though he is able to move his thumb. He notes sensation is still intact. He states he recently had a tetanus shot. [facility] is HIV positive and [facility]. States has undetectable viral load.   Past Medical History  Diagnosis Date  . HIV disease   . Patella-femoral syndrome    History reviewed. No pertinent past surgical history. History reviewed. No pertinent family history. History  Substance Use Topics  . Smoking status: Current Every Day Smoker -- 0.50 packs/day    Types: Cigarettes  . Smokeless tobacco: Not on file  . Alcohol Use: No     Comment: occasional    Review of Systems  Musculoskeletal:       Pain at laceration site  Skin: Positive for wound.       laceration  Neurological: Negative for weakness and numbness.    Allergies  Review of patient's allergies indicates no known allergies.  Home Medications   Prior to Admission medications   Medication Sig Start Date End Date Taking? Authorizing Provider  albuterol (PROVENTIL HFA;VENTOLIN HFA) 108 (90 BASE) MCG/ACT inhaler Inhale 1-2 puffs into the lungs every 6 (six) hours as needed for wheezing or shortness of breath. 01/15/14   Reuben Likesavid C Keller, MD  amoxicillin (AMOXIL) 500 MG capsule Take 2 capsules (1,000 mg total) by mouth 3 (three) times daily. 01/15/14   Reuben Likesavid C Keller, MD  diazepam (VALIUM) 5 MG tablet Take 1 tablet (5 mg total) by mouth 2 (two) times daily. 04/21/13   Nicole Pisciotta, PA-C   diclofenac (VOLTAREN) 50 MG EC tablet Take 1 tablet (50 mg total) by mouth 2 (two) times daily. 12/10/12   Arman FilterGail K Schulz, NP  elvitegravir-cobicistat-emtricitabine-tenofovir (STRIBILD) 150-150-200-300 MG TABS Take 1 tablet by mouth at bedtime.    Historical Provider, MD  guaiFENesin-codeine (ROBITUSSIN AC) 100-10 MG/5ML syrup Take 5 mLs by mouth 3 (three) times daily as needed for cough. 01/15/14   Reuben Likesavid C Keller, MD  ipratropium (ATROVENT) 0.06 % nasal spray Place 2 sprays into both nostrils 4 (four) times daily. 01/15/14   Reuben Likesavid C Keller, MD  predniSONE (DELTASONE) 20 MG tablet Take 1 tablet (20 mg total) by mouth 2 (two) times daily. 01/15/14   Reuben Likesavid C Keller, MD  traMADol (ULTRAM) 50 MG tablet Take 1 tablet (50 mg total) by mouth every 6 (six) hours as needed. 04/21/13   Nicole Pisciotta, PA-C   BP 124/69 mmHg  Pulse 74  Temp(Src) 99.4 F (37.4 C) (Oral)  Resp 12  SpO2 100% Physical Exam  Constitutional: He appears well-developed and well-nourished. No distress.  HENT:  Head: Normocephalic and atraumatic.  Musculoskeletal:  In right thumb there is full range of motion in flexion, extension, abduction, and adduction, there is pain with these movements, though he is able to complete all the movements, he is able to touch his thumb to all of his fingers on his right hand  Neurological:  Sensation to light touch intact in right thumb  Skin:  Reverse J shaped laceration over the dorsum of the right thumb extending from mid proximal phalynx medial to the PIP joint to just distal to the joint, the top of the J shape is proximal and is an ellipse that comes together near the curve of the reverse J, there is no apparent foreign body noted    ED Course  LACERATION REPAIR Date/Time: 04/08/2014 12:41 PM Performed by: Birdie SonsSONNENBERG, Aarini Slee G Authorized by: Bradd CanaryKINDL, JAMES D Consent: Verbal consent obtained. Risks and benefits: risks, benefits and alternatives were discussed Consent given by:  patient Patient identity confirmed: verbally with patient Body area: upper extremity Location details: right thumb Laceration length: 3.5 cm Foreign bodies: no foreign bodies Tendon involvement: none Nerve involvement: none Vascular damage: no Anesthesia: local infiltration Local anesthetic: lidocaine 2% without epinephrine Patient sedated: no Preparation: Patient was prepped and draped in the usual sterile fashion. Irrigation solution: saline Irrigation method: syringe Amount of cleaning: standard Debridement: none Degree of undermining: none Skin closure: 4-0 Prolene Number of sutures: 4 Technique: simple Approximation: close Approximation difficulty: simple Dressing: 4x4 sterile gauze Patient tolerance: Patient tolerated the procedure well with no immediate complications   (including critical care time) Labs Review Labs Reviewed - No data to display  Imaging Review Dg Finger Thumb Right  04/08/2014   CLINICAL DATA:  Acute right thumb laceration.  EXAM: RIGHT THUMB 2+V  COMPARISON:  None.  FINDINGS: No fracture. Joints are normally space and aligned. Small soft tissue defect at the dorsal to the first metacarpophalangeal joint. No radiopaque foreign body.  IMPRESSION: No fracture.  No radiopaque foreign body.   Electronically Signed   By: Amie Portlandavid  Ormond M.D.   On: 04/08/2014 11:30     MDM   1. Thumb laceration, right, initial encounter    Patient with laceration of right thumb. XR right thumb revealed no fracture or radiopaque foreign body. Repair per above. Patient reports recent tetanus shot. Area dressed with gauze and patient placed in aluminum thumb splint. Given return precautions. Is to follow-up in 10 days for suture removal.   This patient was discussed with Dr Artis FlockKindl and seen with Narda BondsLee Presson PA. They helped formulate the plan of care for this patient.   Marikay AlarEric Rollie Hynek, MD [facility] PGY3    Glori LuisEric G Darrel Baroni, MD 04/08/14 [phone]

## 2014-04-08 NOTE — Discharge Instructions (Signed)

## 2014-04-18 ENCOUNTER — Emergency Department (INDEPENDENT_AMBULATORY_CARE_PROVIDER_SITE_OTHER)
Admission: EM | Admit: 2014-04-18 | Discharge: 2014-04-18 | Disposition: A | Discharge status: Home / Self Care | Payer: Medicaid Other | Source: Home / Self Care | Attending: Family Medicine | Admitting: Family Medicine

## 2014-04-18 ENCOUNTER — Encounter (HOSPITAL_COMMUNITY): Payer: Self-pay | Admitting: Emergency Medicine

## 2014-04-18 DIAGNOSIS — Z4802 Encounter for removal of sutures: Secondary | ICD-10-CM | POA: Diagnosis not present

## 2014-04-18 NOTE — ED Provider Notes (Signed)
CSN: [id number]     Arrival date & time 04/18/14  1325 History   First MD Initiated Contact with Patient 04/18/14 1417     Chief Complaint  Patient presents with  . Suture / Staple Removal   (Consider location/radiation/quality/duration/timing/severity/associated sxs/prior Treatment) HPI           patient presents to get sutures removed. Had sutures placed in his right thumb 10 days ago. No pain, swelling, drainage, or signs of wound infection.  Past Medical History  Diagnosis Date  . HIV disease   . Patella-femoral syndrome    History reviewed. No pertinent past surgical history. History reviewed. No pertinent family history. History  Substance Use Topics  . Smoking status: Current Every Day Smoker -- 0.50 packs/day    Types: Cigarettes  . Smokeless tobacco: Not on file  . Alcohol Use: No     Comment: occasional    Review of Systems  Skin: Positive for wound.  All other systems reviewed and are negative.   Allergies  Review of patient's allergies indicates no known allergies.  Home Medications   Prior to Admission medications   Medication Sig Start Date End Date Taking? Authorizing Provider  albuterol (PROVENTIL HFA;VENTOLIN HFA) 108 (90 BASE) MCG/ACT inhaler Inhale 1-2 puffs into the lungs every 6 (six) hours as needed for wheezing or shortness of breath. 01/15/14   Reuben Likesavid C Keller, MD  amoxicillin (AMOXIL) 500 MG capsule Take 2 capsules (1,000 mg total) by mouth 3 (three) times daily. 01/15/14   Reuben Likesavid C Keller, MD  diazepam (VALIUM) 5 MG tablet Take 1 tablet (5 mg total) by mouth 2 (two) times daily. 04/21/13   Nicole Pisciotta, PA-C  diclofenac (VOLTAREN) 50 MG EC tablet Take 1 tablet (50 mg total) by mouth 2 (two) times daily. 12/10/12   Arman FilterGail K Schulz, NP  elvitegravir-cobicistat-emtricitabine-tenofovir (STRIBILD) 150-150-200-300 MG TABS Take 1 tablet by mouth at bedtime.    Historical Provider, MD  guaiFENesin-codeine (ROBITUSSIN AC) 100-10 MG/5ML syrup Take 5 mLs by  mouth 3 (three) times daily as needed for cough. 01/15/14   Reuben Likesavid C Keller, MD  ipratropium (ATROVENT) 0.06 % nasal spray Place 2 sprays into both nostrils 4 (four) times daily. 01/15/14   Reuben Likesavid C Keller, MD  predniSONE (DELTASONE) 20 MG tablet Take 1 tablet (20 mg total) by mouth 2 (two) times daily. 01/15/14   Reuben Likesavid C Keller, MD  traMADol (ULTRAM) 50 MG tablet Take 1 tablet (50 mg total) by mouth every 6 (six) hours as needed. 04/21/13   Nicole Pisciotta, PA-C   BP 114/80 mmHg  Pulse 60  Temp(Src) 98.5 F (36.9 C) (Oral)  Resp 16  SpO2 96% Physical Exam  Constitutional: He is oriented to person, place, and time. He appears well-developed and well-nourished. No distress.  HENT:  Head: Normocephalic.  Pulmonary/Chest: Effort normal. No respiratory distress.  Musculoskeletal:       Right hand: He exhibits laceration (4 simple interrupted sutures in place, wound edges are approximated, healing, no signs of infection).  Neurological: He is alert and oriented to person, place, and time. Coordination normal.  Skin: Skin is warm and dry. No rash noted. He is not diaphoretic.  Psychiatric: He has a normal mood and affect. Judgment normal.  Nursing note and vitals reviewed.   ED Course  Procedures (including critical care time) Labs Review Labs Reviewed - No data to display  Imaging Review No results found.   MDM   1. Visit for suture removal    Sutures  removed. Follow-up when necessary    Graylon GoodZachary H Anyla Israelson, PA-C 04/18/14 1446

## 2014-04-18 NOTE — Discharge Instructions (Signed)

## 2014-05-26 ENCOUNTER — Encounter (HOSPITAL_COMMUNITY): Payer: Self-pay | Admitting: Emergency Medicine

## 2014-05-26 ENCOUNTER — Emergency Department (HOSPITAL_COMMUNITY): Payer: Self-pay

## 2014-05-26 ENCOUNTER — Emergency Department (HOSPITAL_COMMUNITY)
Admission: EM | Admit: 2014-05-26 | Discharge: 2014-05-26 | Disposition: A | Discharge status: Home / Self Care | Payer: Self-pay | Attending: Emergency Medicine | Admitting: Emergency Medicine

## 2014-05-26 DIAGNOSIS — B20 Human immunodeficiency virus [HIV] disease: Secondary | ICD-10-CM

## 2014-05-26 DIAGNOSIS — Z21 Asymptomatic human immunodeficiency virus [HIV] infection status: Secondary | ICD-10-CM | POA: Insufficient documentation

## 2014-05-26 DIAGNOSIS — Z79899 Other long term (current) drug therapy: Secondary | ICD-10-CM | POA: Insufficient documentation

## 2014-05-26 DIAGNOSIS — J069 Acute upper respiratory infection, unspecified: Secondary | ICD-10-CM | POA: Insufficient documentation

## 2014-05-26 DIAGNOSIS — Z8739 Personal history of other diseases of the musculoskeletal system and connective tissue: Secondary | ICD-10-CM | POA: Insufficient documentation

## 2014-05-26 DIAGNOSIS — Z720 Tobacco use: Secondary | ICD-10-CM | POA: Insufficient documentation

## 2014-05-26 LAB — CBC WITH DIFFERENTIAL/PLATELET
Basophils Absolute: 0 10*3/uL (ref 0.0–0.1)
Basophils Relative: 0 % (ref 0–1)
Eosinophils Absolute: 0.4 10*3/uL (ref 0.0–0.7)
Eosinophils Relative: 3 % (ref 0–5)
HCT: 44.9 % (ref 39.0–52.0)
HEMOGLOBIN: 14.5 g/dL (ref 13.0–17.0)
LYMPHS PCT: 26 % (ref 12–46)
Lymphs Abs: 3.2 10*3/uL (ref 0.7–4.0)
MCH: 27.2 pg (ref 26.0–34.0)
MCHC: 32.3 g/dL (ref 30.0–36.0)
MCV: 84.1 fL (ref 78.0–100.0)
Monocytes Absolute: 1.1 10*3/uL — ABNORMAL HIGH (ref 0.1–1.0)
Monocytes Relative: 9 % (ref 3–12)
Neutro Abs: 7.7 10*3/uL (ref 1.7–7.7)
Neutrophils Relative %: 62 % (ref 43–77)
PLATELETS: 251 10*3/uL (ref 150–400)
RBC: 5.34 MIL/uL (ref 4.22–5.81)
RDW: 13.1 % (ref 11.5–15.5)
WBC: 12.4 10*3/uL — AB (ref 4.0–10.5)

## 2014-05-26 LAB — I-STAT CHEM 8, ED
BUN: 7 mg/dL (ref 6–23)
Calcium, Ion: 1.12 mmol/L (ref 1.12–1.23)
Chloride: 104 mEq/L (ref 96–112)
Creatinine, Ser: 0.9 mg/dL (ref 0.50–1.35)
Glucose, Bld: 94 mg/dL (ref 70–99)
HCT: 49 % (ref 39.0–52.0)
Hemoglobin: 16.7 g/dL (ref 13.0–17.0)
POTASSIUM: 3.5 mmol/L (ref 3.5–5.1)
SODIUM: 139 mmol/L (ref 135–145)
TCO2: 21 mmol/L (ref 0–100)

## 2014-05-26 MED ORDER — ALBUTEROL SULFATE HFA 108 (90 BASE) MCG/ACT IN AERS
2.0000 | INHALATION_SPRAY | Freq: Once | RESPIRATORY_TRACT | Status: AC
  Administered 2014-05-26: 2 via RESPIRATORY_TRACT
  Filled 2014-05-26: qty 6.7

## 2014-05-26 MED ORDER — HYDROCOD POLST-CHLORPHEN POLST 10-8 MG/5ML PO LQCR: 5.0000 mL | Freq: Two times a day (BID) | ORAL | Status: DC | PRN

## 2014-05-26 MED ORDER — IPRATROPIUM-ALBUTEROL 0.5-2.5 (3) MG/3ML IN SOLN
3.0000 mL | Freq: Once | RESPIRATORY_TRACT | Status: AC
  Administered 2014-05-26: 3 mL via RESPIRATORY_TRACT
  Filled 2014-05-26: qty 3

## 2014-05-26 NOTE — ED Notes (Signed)
Pt states he has had a bad cough, night sweats, and his chest is burning  Pt states his sxs started on Wednesday  Pt states he is coughing up yellow phlegm sometimes and other times it is white  Pt states he has been taking alka seltzer, theraflu, and mucinex without relief

## 2014-05-26 NOTE — Discharge Instructions (Signed)
Read the information below.  Use the prescribed medication as directed.  Please discuss all new medications with your pharmacist.  You may return to the Emergency Department at any time for worsening condition or any new symptoms that concern you.  If you develop high fevers that do not resolve with tylenol or ibuprofen, you have difficulty swallowing or breathing, or you are unable to tolerate fluids by mouth, return to the ER for a recheck.    ° ° °Upper Respiratory Infection, Adult °An upper respiratory infection (URI) is also sometimes known as the common cold. The upper respiratory tract includes the nose, sinuses, throat, trachea, and bronchi. Bronchi are the airways leading to the lungs. Most people improve within 1 week, but symptoms can last up to 2 weeks. A residual cough may last even longer.  °CAUSES °Many different viruses can infect the tissues lining the upper respiratory tract. The tissues become irritated and inflamed and often become very moist. Mucus production is also common. A cold is contagious. You can easily spread the virus to others by oral contact. This includes kissing, sharing a glass, coughing, or sneezing. Touching your mouth or nose and then touching a surface, which is then touched by another person, can also spread the virus. °SYMPTOMS  °Symptoms typically develop 1 to 3 days after you come in contact with a cold virus. Symptoms vary from person to person. They may include: °· Runny nose. °· Sneezing. °· Nasal congestion. °· Sinus irritation. °· Sore throat. °· Loss of voice (laryngitis). °· Cough. °· Fatigue. °· Muscle aches. °· Loss of appetite. °· Headache. °· Low-grade fever. °DIAGNOSIS  °You might diagnose your own cold based on familiar symptoms, since most people get a cold 2 to 3 times a year. Your caregiver can confirm this based on your exam. Most importantly, your caregiver can check that your symptoms are not due to another disease such as strep throat, sinusitis,  pneumonia, asthma, or epiglottitis. Blood tests, throat tests, and X-rays are not necessary to diagnose a common cold, but they may sometimes be helpful in excluding other more serious diseases. Your caregiver will decide if any further tests are required. °RISKS AND COMPLICATIONS  °You may be at risk for a more severe case of the common cold if you smoke cigarettes, have chronic heart disease (such as heart failure) or lung disease (such as asthma), or if you have a weakened immune system. The very young and very old are also at risk for more serious infections. Bacterial sinusitis, middle ear infections, and bacterial pneumonia can complicate the common cold. The common cold can worsen asthma and chronic obstructive pulmonary disease (COPD). Sometimes, these complications can require emergency medical care and may be life-threatening. °PREVENTION  °The best way to protect against getting a cold is to practice good hygiene. Avoid oral or hand contact with people with cold symptoms. Wash your hands often if contact occurs. There is no clear evidence that vitamin C, vitamin E, echinacea, or exercise reduces the chance of developing a cold. However, it is always recommended to get plenty of rest and practice good nutrition. °TREATMENT  °Treatment is directed at relieving symptoms. There is no cure. Antibiotics are not effective, because the infection is caused by a virus, not by bacteria. Treatment may include: °· Increased fluid intake. Sports drinks offer valuable electrolytes, sugars, and fluids. °· Breathing heated mist or steam (vaporizer or shower). °· Eating chicken soup or other clear broths, and maintaining good nutrition. °· Getting plenty   of rest.  Using gargles or lozenges for comfort.  Controlling fevers with ibuprofen or acetaminophen as directed by your caregiver.  Increasing usage of your inhaler if you have asthma. Zinc gel and zinc lozenges, taken in the first 24 hours of the common cold, can  shorten the duration and lessen the severity of symptoms. Pain medicines may help with fever, muscle aches, and throat pain. A variety of non-prescription medicines are available to treat congestion and runny nose. Your caregiver can make recommendations and may suggest nasal or lung inhalers for other symptoms.  HOME CARE INSTRUCTIONS   Only take over-the-counter or prescription medicines for pain, discomfort, or fever as directed by your caregiver.  Use a warm mist humidifier or inhale steam from a shower to increase air moisture. This may keep secretions moist and make it easier to breathe.  Drink enough water and fluids to keep your urine clear or pale yellow.  Rest as needed.  Return to work when your temperature has returned to normal or as your caregiver advises. You may need to stay home longer to avoid infecting others. You can also use a face mask and careful hand washing to prevent spread of the virus. SEEK MEDICAL CARE IF:   After the first few days, you feel you are getting worse rather than better.  You need your caregiver's advice about medicines to control symptoms.  You develop chills, worsening shortness of breath, or brown or red sputum. These may be signs of pneumonia.  You develop yellow or brown nasal discharge or pain in the face, especially when you bend forward. These may be signs of sinusitis.  You develop a fever, swollen neck glands, pain with swallowing, or white areas in the back of your throat. These may be signs of strep throat. SEEK IMMEDIATE MEDICAL CARE IF:   You have a fever.  You develop severe or persistent headache, ear pain, sinus pain, or chest pain.  You develop wheezing, a prolonged cough, cough up blood, or have a change in your usual mucus (if you have chronic lung disease).  You develop sore muscles or a stiff neck. Document Released: 11/09/2000 Document Revised: 08/08/2011 Document Reviewed: 08/21/2013 [facility] Patient Information 2015  [place]. This information is not intended to replace advice given to you by your health care provider. Make sure you discuss any questions you have with your health care provider.  Viral Infections A viral infection can be caused by different types of viruses.Most viral infections are not serious and resolve on their own. However, some infections may cause severe symptoms and may lead to further complications. SYMPTOMS Viruses can frequently cause:  Minor sore throat.  Aches and pains.  Headaches.  Runny nose.  Different types of rashes.  Watery eyes.  Tiredness.  Cough.  Loss of appetite.  Gastrointestinal infections, resulting in nausea, vomiting, and diarrhea. These symptoms do not respond to antibiotics because the infection is not caused by bacteria. However, you might catch a bacterial infection following the viral infection. This is sometimes called a "superinfection." Symptoms of such a bacterial infection may include:  Worsening sore throat with pus and difficulty swallowing.  Swollen neck glands.  Chills and a high or persistent fever.  Severe headache.  Tenderness over the sinuses.  Persistent overall ill feeling (malaise), muscle aches, and tiredness (fatigue).  Persistent cough.  Yellow, green, or brown mucus production with coughing. HOME CARE INSTRUCTIONS   Only take over-the-counter or prescription medicines for pain, discomfort, diarrhea, or fever  as directed by your caregiver.  Drink enough water and fluids to keep your urine clear or pale yellow. Sports drinks can provide valuable electrolytes, sugars, and hydration.  Get plenty of rest and maintain proper nutrition. Soups and broths with crackers or rice are fine. SEEK IMMEDIATE MEDICAL CARE IF:   You have severe headaches, shortness of breath, chest pain, neck pain, or an unusual rash.  You have uncontrolled vomiting, diarrhea, or you are unable to keep down fluids.  You or your  child has an oral temperature above 102 F (38.9 C), not controlled by medicine.  Your baby is older than 3 months with a rectal temperature of 102 F (38.9 C) or higher.  Your baby is 403 months old or younger with a rectal temperature of 100.4 F (38 C) or higher. MAKE SURE YOU:   Understand these instructions.  Will watch your condition.  Will get help right away if you are not doing well or get worse. Document Released: 02/23/2005 Document Revised: 08/08/2011 Document Reviewed: 09/20/2010 [facility] Patient Information 2015 [place]. This information is not intended to replace advice given to you by your health care provider. Make sure you discuss any questions you have with your health care provider.

## 2014-05-26 NOTE — ED Provider Notes (Signed)
CSN: [id number]     Arrival date & time 05/26/14  1940 History   First MD Initiated Contact with Patient 05/26/14 2158     Chief Complaint  Patient presents with  . Cough     (Consider location/radiation/quality/duration/timing/severity/associated sxs/prior Treatment) The history is provided by the patient.     Pt with hx HIV that patient states is well controlled [facility] most recent viral load undetectable in August, CD4 "high" - he did have two weeks without treatment last month due to insurance issues) p/w cough productive of yellow sputum with SOB, chest tightness and burning, rhinorrhea, nasal congestion, posttussive emesis, mild sore throat, decreased appetite, night sweats.  Denies abdominal pain.    Past Medical History  Diagnosis Date  . HIV disease   . Patella-femoral syndrome    Past Surgical History  Procedure Laterality Date  . Back surgery     History reviewed. No pertinent family history. History  Substance Use Topics  . Smoking status: Current Every Day Smoker -- 0.50 packs/day    Types: Cigarettes  . Smokeless tobacco: Not on file  . Alcohol Use: Yes     Comment: occasional    Review of Systems  All other systems reviewed and are negative.     Allergies  Review of patient's allergies indicates no known allergies.  Home Medications   Prior to Admission medications   Medication Sig Start Date End Date Taking? Authorizing Provider  elvitegravir-cobicistat-emtricitabine-tenofovir (STRIBILD) 150-150-200-300 MG TABS Take 1 tablet by mouth at bedtime.   Yes Historical Provider, MD  albuterol (PROVENTIL HFA;VENTOLIN HFA) 108 (90 BASE) MCG/ACT inhaler Inhale 1-2 puffs into the lungs every 6 (six) hours as needed for wheezing or shortness of breath. Patient not taking: Reported on 05/26/2014 01/15/14   Reuben Likesavid C Keller, MD  amoxicillin (AMOXIL) 500 MG capsule Take 2 capsules (1,000 mg total) by mouth 3 (three) times daily. Patient not taking: Reported on  05/26/2014 01/15/14   Reuben Likesavid C Keller, MD  diazepam (VALIUM) 5 MG tablet Take 1 tablet (5 mg total) by mouth 2 (two) times daily. Patient not taking: Reported on 05/26/2014 04/21/13   Joni ReiningNicole Pisciotta, PA-C  diclofenac (VOLTAREN) 50 MG EC tablet Take 1 tablet (50 mg total) by mouth 2 (two) times daily. Patient not taking: Reported on 05/26/2014 12/10/12   Arman FilterGail K Schulz, NP  guaiFENesin-codeine [facility](ROBITUSSIN AC) 100-10 MG/5ML syrup Take 5 mLs by mouth 3 (three) times daily as needed for cough. Patient not taking: Reported on 05/26/2014 01/15/14   Reuben Likesavid C Keller, MD  ipratropium (ATROVENT) 0.06 % nasal spray Place 2 sprays into both nostrils 4 (four) times daily. Patient not taking: Reported on 05/26/2014 01/15/14   Reuben Likesavid C Keller, MD  predniSONE (DELTASONE) 20 MG tablet Take 1 tablet (20 mg total) by mouth 2 (two) times daily. Patient not taking: Reported on 05/26/2014 01/15/14   Reuben Likesavid C Keller, MD  traMADol (ULTRAM) 50 MG tablet Take 1 tablet (50 mg total) by mouth every 6 (six) hours as needed. Patient not taking: Reported on 05/26/2014 04/21/13   Joni ReiningNicole Pisciotta, PA-C   BP 118/66 mmHg  Pulse 77  Temp(Src) 98.6 F (37 C) (Oral)  Resp 20  SpO2 100% Physical Exam  Constitutional: He appears well-developed and well-nourished. No distress.  HENT:  Head: Normocephalic and atraumatic.  Neck: Normal range of motion. Neck supple.  Cardiovascular: Normal rate and regular rhythm.   Pulmonary/Chest: Effort normal and breath sounds normal. No stridor. No respiratory distress. He has no  wheezes. He has no rales.  Abdominal: Soft. He exhibits no distension and no mass. There is no tenderness. There is no rebound and no guarding.  Neurological: He is alert. He exhibits normal muscle tone.  Skin: He is not diaphoretic.  Psychiatric: He has a normal mood and affect. His behavior is normal.  Nursing note and vitals reviewed.   ED Course  Procedures (including critical care time) Labs Review Labs  Reviewed  CBC WITH DIFFERENTIAL - Abnormal; Notable for the following:    WBC 12.4 (*)    Monocytes Absolute 1.1 (*)    All other components within normal limits  I-STAT CHEM 8, ED    Imaging Review Dg Chest 2 View  05/26/2014   CLINICAL DATA:  Cough, night sweats  EXAM: CHEST  2 VIEW  COMPARISON:  01/15/2014  FINDINGS: Lungs are clear. No consolidation. No pleural effusion or pneumothorax.  The heart is normal in size.  Visualized osseous structures are within normal limits.  IMPRESSION: No evidence of acute cardiopulmonary disease.   Electronically Signed   By: Charline BillsSriyesh  Krishnan M.D.   On: 05/26/2014 23:06     EKG Interpretation None      Pt reports he is feeling much better after breathing treatment.    MDM   Final diagnoses:  URI (upper respiratory infection)  HIV infection    Afebrile, nontoxic patient with well controlled HIV (per patient) with constellation of symptoms suggestive of viral syndrome.  CXR negative.  Lab work with leukocytosis only.  Pt feeling improvement with neb treatment.  D/C home with albuterol, tussionex, close PCP/ID follow up.  Discussed result, findings, treatment, and follow up  with patient.  Pt given return precautions.  Pt verbalizes understanding and agrees with plan.         [place] HolsteinEmily Carmela Piechowski, PA-C 05/27/14 0037  Elwin MochaBlair Walden, MD 05/27/14 [phone]

## 2014-06-03 ENCOUNTER — Encounter (HOSPITAL_COMMUNITY): Payer: Self-pay | Admitting: Emergency Medicine

## 2014-06-03 ENCOUNTER — Emergency Department (INDEPENDENT_AMBULATORY_CARE_PROVIDER_SITE_OTHER)
Admission: EM | Admit: 2014-06-03 | Discharge: 2014-06-03 | Disposition: A | Discharge status: Home / Self Care | Payer: Self-pay | Source: Home / Self Care | Attending: Family Medicine | Admitting: Family Medicine

## 2014-06-03 DIAGNOSIS — K0889 Other specified disorders of teeth and supporting structures: Secondary | ICD-10-CM

## 2014-06-03 DIAGNOSIS — K088 Other specified disorders of teeth and supporting structures: Secondary | ICD-10-CM

## 2014-06-03 MED ORDER — HYDROCODONE-ACETAMINOPHEN 5-325 MG PO TABS: 1.0000 | ORAL_TABLET | ORAL | Status: DC | PRN

## 2014-06-03 MED ORDER — AMOXICILLIN 500 MG PO CAPS: 500.0000 mg | ORAL_CAPSULE | Freq: Three times a day (TID) | ORAL | Status: DC

## 2014-06-03 NOTE — ED Provider Notes (Signed)
CSN: [id number]     Arrival date & time 06/03/14  1400 History   First MD Initiated Contact with Patient 06/03/14 1510     Chief Complaint  Patient presents with  . Dental Pain   (Consider location/radiation/quality/duration/timing/severity/associated sxs/prior Treatment) HPI Comments: 29 year old male with HIV is complaining of toothache for 4 days. He states that 3 days prior to the pain he chipped the tooth. The tooth involved is over the lower left jaw the second molar #19   Past Medical History  Diagnosis Date  . HIV disease   . Patella-femoral syndrome    Past Surgical History  Procedure Laterality Date  . Back surgery     No family history on file. History  Substance Use Topics  . Smoking status: Current Every Day Smoker -- 0.50 packs/day    Types: Cigarettes  . Smokeless tobacco: Not on file  . Alcohol Use: Yes     Comment: occasional    Review of Systems  HENT: Positive for dental problem.   All other systems reviewed and are negative.   Allergies  Review of patient's allergies indicates no known allergies.  Home Medications   Prior to Admission medications   Medication Sig Start Date End Date Taking? Authorizing Provider  albuterol (PROVENTIL HFA;VENTOLIN HFA) 108 (90 BASE) MCG/ACT inhaler Inhale 1-2 puffs into the lungs every 6 (six) hours as needed for wheezing or shortness of breath. Patient not taking: Reported on 05/26/2014 01/15/14   Reuben Likesavid C Keller, MD  amoxicillin (AMOXIL) 500 MG capsule Take 1 capsule (500 mg total) by mouth 3 (three) times daily. 06/03/14   Hayden Rasmussenavid [place] Severs, NP  chlorpheniramine-HYDROcodone [facility](TUSSIONEX PENNKINETIC ER) 10-8 MG/5ML LQCR Take 5 mLs by mouth every 12 (twelve) hours as needed for cough (and pain). 05/26/14   Trixie DredgeEmily West, PA-C  elvitegravir-cobicistat-emtricitabine-tenofovir (STRIBILD) 150-150-200-300 MG TABS Take 1 tablet by mouth at bedtime.    Historical Provider, MD  HYDROcodone-acetaminophen (NORCO/VICODIN) 5-325 MG per tablet  Take 1 tablet by mouth every 4 (four) hours as needed. 06/03/14   Hayden Rasmussenavid Danyia Borunda, NP   BP 122/75 mmHg  Pulse 106  Temp(Src) 99.9 F (37.7 C) (Oral)  Resp 18  SpO2 100% Physical Exam  Constitutional: He is oriented to person, place, and time. He appears well-developed and well-nourished. No distress.  HENT:  Mouth/Throat: Oropharynx is clear and moist.  Molar tooth #19 with obvious enamel fracture involving the occlusal surface and anterior surface. No evidence of abscess formation.  Neck: Normal range of motion. Neck supple.  Pulmonary/Chest: Effort normal. No respiratory distress.  Lymphadenopathy:    He has no cervical adenopathy.  Neurological: He is oriented to person, place, and time.  Skin: Skin is warm and dry.  Psychiatric: He has a normal mood and affect.  Nursing note and vitals reviewed.   ED Course  Procedures (including critical care time) Labs Review Labs Reviewed - No data to display  Imaging Review No results found.   MDM   1. Pain, dental    Norco 5 mg #15 and amoxicillin See dentist on the 18th     Hayden Rasmussenavid Anabel Lykins, NP 06/03/14 1544

## 2014-06-03 NOTE — ED Notes (Signed)
Bottom, left tooth pain, onset 4 days ago

## 2014-06-03 NOTE — Discharge Instructions (Signed)
Dental Pain °A tooth ache may be caused by cavities (tooth decay). Cavities expose the nerve of the tooth to air and hot or cold temperatures. It may come from an infection or abscess (also called a boil or furuncle) around your tooth. It is also often caused by dental caries (tooth decay). This causes the pain you are having. °DIAGNOSIS  °Your caregiver can diagnose this problem by exam. °TREATMENT  °· If caused by an infection, it may be treated with medications which kill germs (antibiotics) and pain medications as prescribed by your caregiver. Take medications as directed. °· Only take over-the-counter or prescription medicines for pain, discomfort, or fever as directed by your caregiver. °· Whether the tooth ache today is caused by infection or dental disease, you should see your dentist as soon as possible for further care. °SEEK MEDICAL CARE IF: °The exam and treatment you received today has been provided on an emergency basis only. This is not a substitute for complete medical or dental care. If your problem worsens or new problems (symptoms) appear, and you are unable to meet with your dentist, call or return to this location. °SEEK IMMEDIATE MEDICAL CARE IF:  °· You have a fever. °· You develop redness and swelling of your face, jaw, or neck. °· You are unable to open your mouth. °· You have severe pain uncontrolled by pain medicine. °MAKE SURE YOU:  °· Understand these instructions. °· Will watch your condition. °· Will get help right away if you are not doing well or get worse. °Document Released: 05/16/2005 Document Revised: 08/08/2011 Document Reviewed: 01/02/2008 °ExitCare® Patient Information ©2015 [employer]. This information is not intended to replace advice given to you by your health care provider. Make sure you discuss any questions you have with your health care provider. ° °Dental Care and Dentist Visits °Dental care supports good overall health. Regular dental visits can also help you  avoid dental pain, bleeding, infection, and other more serious health problems in the future. It is important to keep the mouth healthy because diseases in the teeth, gums, and other oral tissues can spread to other areas of the body. Some problems, such as diabetes, heart disease, and pre-term labor have been associated with poor oral health.  °See your dentist every 6 months. If you experience emergency problems such as a toothache or broken tooth, go to the dentist right away. If you see your dentist regularly, you may catch problems early. It is easier to be treated for problems in the early stages.  °WHAT TO EXPECT AT A DENTIST VISIT  °Your dentist will look for many common oral health problems and recommend proper treatment. At your regular dental visit, you can expect: °· Gentle cleaning of the teeth and gums. This includes scraping and polishing. This helps to remove the sticky substance around the teeth and gums (plaque). Plaque forms in the mouth shortly after eating. Over time, plaque hardens on the teeth as tartar. If tartar is not removed regularly, it can cause problems. Cleaning also helps remove stains. °· Periodic X-rays. These pictures of the teeth and supporting bone will help your dentist assess the health of your teeth. °· Periodic fluoride treatments. Fluoride is a natural mineral shown to help strengthen teeth. Fluoride treatment involves applying a fluoride gel or varnish to the teeth. It is most commonly done in children. °· Examination of the mouth, tongue, jaws, teeth, and gums to look for any oral health problems, such as: °¨ Cavities (dental caries). This is   decay on the tooth caused by plaque, sugar, and acid in the mouth. It is best to catch a cavity when it is small. °¨ Inflammation of the gums caused by plaque buildup (gingivitis). °¨ Problems with the mouth or malformed or misaligned teeth. °¨ Oral cancer or other diseases of the soft tissues or jaws.  °KEEP YOUR TEETH AND GUMS  HEALTHY °For healthy teeth and gums, follow these general guidelines as well as your dentist's specific advice: °· Have your teeth professionally cleaned at the dentist every 6 months. °· Brush twice daily with a fluoride toothpaste. °· Floss your teeth daily.  °· Ask your dentist if you need fluoride supplements, treatments, or fluoride toothpaste. °· Eat a healthy diet. Reduce foods and drinks with added sugar. °· Avoid smoking. °TREATMENT FOR ORAL HEALTH PROBLEMS °If you have oral health problems, treatment varies depending on the conditions present in your teeth and gums. °· Your caregiver will most likely recommend good oral hygiene at each visit. °· For cavities, gingivitis, or other oral health disease, your caregiver will perform a procedure to treat the problem. This is typically done at a separate appointment. Sometimes your caregiver will refer you to another dental specialist for specific tooth problems or for surgery. °SEEK IMMEDIATE DENTAL CARE IF: °· You have pain, bleeding, or soreness in the gum, tooth, jaw, or mouth area. °· A permanent tooth becomes loose or separated from the gum socket. °· You experience a blow or injury to the mouth or jaw area. °Document Released: 01/26/2011 Document Revised: 08/08/2011 Document Reviewed: 01/26/2011 °ExitCare® Patient Information ©2015 [employer]. This information is not intended to replace advice given to you by your health care provider. Make sure you discuss any questions you have with your health care provider. ° °

## 2014-10-12 ENCOUNTER — Emergency Department (HOSPITAL_COMMUNITY)
Admission: EM | Admit: 2014-10-12 | Discharge: 2014-10-12 | Disposition: A | Discharge status: Home / Self Care | Payer: Self-pay | Attending: Emergency Medicine | Admitting: Emergency Medicine

## 2014-10-12 ENCOUNTER — Emergency Department (HOSPITAL_COMMUNITY): Payer: Self-pay

## 2014-10-12 ENCOUNTER — Encounter (HOSPITAL_COMMUNITY): Payer: Self-pay | Admitting: Emergency Medicine

## 2014-10-12 DIAGNOSIS — S63619A Unspecified sprain of unspecified finger, initial encounter: Secondary | ICD-10-CM

## 2014-10-12 DIAGNOSIS — Z792 Long term (current) use of antibiotics: Secondary | ICD-10-CM | POA: Insufficient documentation

## 2014-10-12 DIAGNOSIS — Y9389 Activity, other specified: Secondary | ICD-10-CM | POA: Insufficient documentation

## 2014-10-12 DIAGNOSIS — Z21 Asymptomatic human immunodeficiency virus [HIV] infection status: Secondary | ICD-10-CM | POA: Insufficient documentation

## 2014-10-12 DIAGNOSIS — S63613A Unspecified sprain of left middle finger, initial encounter: Secondary | ICD-10-CM | POA: Insufficient documentation

## 2014-10-12 DIAGNOSIS — Z79899 Other long term (current) drug therapy: Secondary | ICD-10-CM | POA: Insufficient documentation

## 2014-10-12 DIAGNOSIS — Y929 Unspecified place or not applicable: Secondary | ICD-10-CM | POA: Insufficient documentation

## 2014-10-12 DIAGNOSIS — Z8739 Personal history of other diseases of the musculoskeletal system and connective tissue: Secondary | ICD-10-CM | POA: Insufficient documentation

## 2014-10-12 DIAGNOSIS — Y998 Other external cause status: Secondary | ICD-10-CM | POA: Insufficient documentation

## 2014-10-12 DIAGNOSIS — Z720 Tobacco use: Secondary | ICD-10-CM | POA: Insufficient documentation

## 2014-10-12 DIAGNOSIS — W1849XA Other slipping, tripping and stumbling without falling, initial encounter: Secondary | ICD-10-CM | POA: Insufficient documentation

## 2014-10-12 NOTE — ED Provider Notes (Signed)
CSN: [id number]     Arrival date & time 10/12/14  1918 History  This chart was scribed for non-physician provider Marlon Peliffany Princesa Willig, PA-C, working with Benjiman CoreNathan Pickering, MD by Phillis HaggisGabriella Gaje, ED Scribe. This patient was seen in room TR08C/TR08C and patient care was started at 9:36 PM.    Chief Complaint  Patient presents with  . Finger Injury   The history is provided by the patient. No language interpreter was used.  HPI Comments: Cody Villarreal is a 29 y.o. male who presents to the Emergency Department complaining of a left middle finger injury onset one week ago. He states that he tripped over a cord and hit his hand on the bed post. He reports pain and swelling to the area; he reports increased pain with bending the finger. He denies hitting his head or LOC.   Past Medical History  Diagnosis Date  . HIV disease   . Patella-femoral syndrome    Past Surgical History  Procedure Laterality Date  . Back surgery     History reviewed. No pertinent family history. History  Substance Use Topics  . Smoking status: Current Every Day Smoker -- 0.50 packs/day    Types: Cigarettes  . Smokeless tobacco: Not on file  . Alcohol Use: Yes     Comment: occasional    Review of Systems  Musculoskeletal: Positive for arthralgias.  Neurological: Negative for syncope.  All other systems reviewed and are negative.  Allergies  Review of patient's allergies indicates no known allergies.  Home Medications   Prior to Admission medications   Medication Sig Start Date End Date Taking? Authorizing Provider  albuterol (PROVENTIL HFA;VENTOLIN HFA) 108 (90 BASE) MCG/ACT inhaler Inhale 1-2 puffs into the lungs every 6 (six) hours as needed for wheezing or shortness of breath. Patient not taking: Reported on 05/26/2014 01/15/14   Reuben Likesavid C Keller, MD  amoxicillin (AMOXIL) 500 MG capsule Take 1 capsule (500 mg total) by mouth 3 (three) times daily. 06/03/14   Hayden Rasmussenavid Mabe, NP  chlorpheniramine-HYDROcodone [facility](TUSSIONEX  PENNKINETIC ER) 10-8 MG/5ML LQCR Take 5 mLs by mouth every 12 (twelve) hours as needed for cough (and pain). 05/26/14   Trixie DredgeEmily West, PA-C  elvitegravir-cobicistat-emtricitabine-tenofovir (STRIBILD) 150-150-200-300 MG TABS Take 1 tablet by mouth at bedtime.    Historical Provider, MD  HYDROcodone-acetaminophen (NORCO/VICODIN) 5-325 MG per tablet Take 1 tablet by mouth every 4 (four) hours as needed. 06/03/14   Hayden Rasmussenavid Mabe, NP   BP 133/68 mmHg  Pulse 88  Temp(Src) 98.6 F (37 C) (Oral)  Resp 14  Ht 5\' 9"  (1.753 m)  Wt 120 lb (54.432 kg)  BMI 17.71 kg/m2  SpO2 100%   Physical Exam  Constitutional: He is oriented to person, place, and time. He appears well-developed and well-nourished.  HENT:  Head: Normocephalic and atraumatic.  Eyes: EOM are normal.  Neck: Normal range of motion. Neck supple.  Cardiovascular: Normal rate.   Pulmonary/Chest: Effort normal.  Musculoskeletal: Normal range of motion.  Middle finger has swelling about the PIP. FROM is intact but he does have pain with ROM and increased pain again resistance. No crepitus. CR < 2 seconds to all five fingers. Radial pulse is strong.  No erythema or induration.  Neurological: He is alert and oriented to person, place, and time.  Skin: Skin is warm and dry.  Psychiatric: He has a normal mood and affect. His behavior is normal.  Nursing note and vitals reviewed.   ED Course  Procedures (including critical care time) DIAGNOSTIC STUDIES: Oxygen  Saturation is 100% on room air, normal by my interpretation.    COORDINATION OF CARE: 9:38 PM-Discussed treatment plan which includes discussed use of finger splint for a week; discussed taking splint off for movement at least once a day.  Referral to hand if pt needs f/u.  Labs Review Labs Reviewed - No data to display  Imaging Review Dg Hand Complete Left  10/12/2014   CLINICAL DATA:  Progressive pain in the middle finger after blunt trauma 1 week ago.  EXAM: LEFT HAND - COMPLETE  3+ VIEW  COMPARISON:  None.  FINDINGS: There is soft tissue swelling at the PIP joint of the long finger. The osseous structures of the long finger in the other bones of the hand are normal.  IMPRESSION: Soft tissue swelling at the PIP joint of the long finger.   Electronically Signed   By: Francene BoyersJames  Maxwell M.D.   On: 10/12/2014 20:06     EKG Interpretation None      MDM   Final diagnoses:  Sprain of finger of left hand, initial encounter   29 y.o.Cody Villarreal's evaluation in the Emergency Department is complete. It has been determined that no acute conditions requiring further emergency intervention are present at this time. The patient/guardian have been advised of the diagnosis and plan. We have discussed signs and symptoms that warrant return to the ED, such as changes or worsening in symptoms.  Vital signs are stable at discharge. Filed Vitals:   10/12/14 1934  BP: 133/68  Pulse: 88  Temp: 98.6 F (37 C)  Resp: 14    Patient/guardian has voiced understanding and agreed to follow-up with the PCP or specialist.  I personally performed the services described in this documentation, which was scribed in my presence. The recorded information has been reviewed and is accurate.   Marlon Peliffany Brandis Wixted, PA-C 10/12/14 2146  Benjiman CoreNathan Pickering, MD 10/12/14 [phone]

## 2014-10-12 NOTE — Discharge Instructions (Signed)
Finger Sprain  A finger sprain is a tear in one of the strong, fibrous tissues that connect the bones (ligaments) in your finger. The severity of the sprain depends on how much of the ligament is torn. The tear can be either partial or complete.  CAUSES   Often, sprains are a result of a fall or accident. If you extend your hands to catch an object or to protect yourself, the force of the impact causes the fibers of your ligament to stretch too much. This excess tension causes the fibers of your ligament to tear.  SYMPTOMS   You may have some loss of motion in your finger. Other symptoms include:   Bruising.   Tenderness.   Swelling.  DIAGNOSIS   In order to diagnose finger sprain, your caregiver will physically examine your finger or thumb to determine how torn the ligament is. Your caregiver may also suggest an X-ray exam of your finger to make sure no bones are broken.  TREATMENT   If your ligament is only partially torn, treatment usually involves keeping the finger in a fixed position (immobilization) for a short period. To do this, your caregiver will apply a bandage, cast, or splint to keep your finger from moving until it heals. For a partially torn ligament, the healing process usually takes 2 to 3 weeks.  If your ligament is completely torn, you may need surgery to reconnect the ligament to the bone. After surgery a cast or splint will be applied and will need to stay on your finger or thumb for 4 to 6 weeks while your ligament heals.  HOME CARE INSTRUCTIONS   Keep your injured finger elevated, when possible, to decrease swelling.   To ease pain and swelling, apply ice to your joint twice a day, for 2 to 3 days:   Put ice in a plastic bag.   Place a towel between your skin and the bag.   Leave the ice on for 15 minutes.   Only take over-the-counter or prescription medicine for pain as directed by your caregiver.   Do not wear rings on your injured finger.   Do not leave your finger unprotected  until pain and stiffness go away (usually 3 to 4 weeks).   Do not allow your cast or splint to get wet. Cover your cast or splint with a plastic bag when you shower or bathe. Do not swim.   Your caregiver may suggest special exercises for you to do during your recovery to prevent or limit permanent stiffness.  SEEK IMMEDIATE MEDICAL CARE IF:   Your cast or splint becomes damaged.   Your pain becomes worse rather than better.  MAKE SURE YOU:   Understand these instructions.   Will watch your condition.   Will get help right away if you are not doing well or get worse.  Document Released: 06/23/2004 Document Revised: 08/08/2011 Document Reviewed: 01/17/2011  ExitCare Patient Information 2015 [employer]. This information is not intended to replace advice given to you by your health care provider. Make sure you discuss any questions you have with your health care provider.

## 2014-10-12 NOTE — ED Notes (Signed)
Patient tripped over a cord and braced his fall with his left hand last week.  Patient noted left middle finger with swelling last week, but continues with swelling and pain today.

## 2015-01-07 ENCOUNTER — Emergency Department (HOSPITAL_COMMUNITY)
Admission: EM | Admit: 2015-01-07 | Discharge: 2015-01-07 | Disposition: A | Discharge status: Home / Self Care | Payer: Self-pay | Attending: Emergency Medicine | Admitting: Emergency Medicine

## 2015-01-07 ENCOUNTER — Encounter (HOSPITAL_COMMUNITY): Payer: Self-pay | Admitting: Emergency Medicine

## 2015-01-07 DIAGNOSIS — Z21 Asymptomatic human immunodeficiency virus [HIV] infection status: Secondary | ICD-10-CM | POA: Insufficient documentation

## 2015-01-07 DIAGNOSIS — M5410 Radiculopathy, site unspecified: Secondary | ICD-10-CM | POA: Insufficient documentation

## 2015-01-07 DIAGNOSIS — Z720 Tobacco use: Secondary | ICD-10-CM | POA: Insufficient documentation

## 2015-01-07 MED ORDER — METHOCARBAMOL 500 MG PO TABS
500.0000 mg | ORAL_TABLET | Freq: Two times a day (BID) | ORAL | Status: DC | PRN
Start: 2015-01-07 — End: 2015-02-11

## 2015-01-07 MED ORDER — NAPROXEN 250 MG PO TABS: 250.0000 mg | ORAL_TABLET | Freq: Two times a day (BID) | ORAL | Status: DC

## 2015-01-07 MED ORDER — ACETAMINOPHEN 500 MG PO TABS
500.0000 mg | ORAL_TABLET | Freq: Once | ORAL | Status: AC
  Administered 2015-01-07: 500 mg via ORAL
  Filled 2015-01-07: qty 1

## 2015-01-07 NOTE — Discharge Instructions (Signed)
Back Exercises Back exercises help treat and prevent back injuries. The goal of back exercises is to increase the strength of your abdominal and back muscles and the flexibility of your back. These exercises should be started when you no longer have back pain. Back exercises include:  Pelvic Tilt. Lie on your back with your knees bent. Tilt your pelvis until the lower part of your back is against the floor. Hold this position 5 to 10 sec and repeat 5 to 10 times.  Knee to Chest. Pull first 1 knee up against your chest and hold for 20 to 30 seconds, repeat this with the other knee, and then both knees. This may be done with the other leg straight or bent, whichever feels better.  Sit-Ups or Curl-Ups. Bend your knees 90 degrees. Start with tilting your pelvis, and do a partial, slow sit-up, lifting your trunk only 30 to 45 degrees off the floor. Take at least 2 to 3 seconds for each sit-up. Do not do sit-ups with your knees out straight. If partial sit-ups are difficult, simply do the above but with only tightening your abdominal muscles and holding it as directed.  Hip-Lift. Lie on your back with your knees flexed 90 degrees. Push down with your feet and shoulders as you raise your hips a couple inches off the floor; hold for 10 seconds, repeat 5 to 10 times.  Back arches. Lie on your stomach, propping yourself up on bent elbows. Slowly press on your hands, causing an arch in your low back. Repeat 3 to 5 times. Any initial stiffness and discomfort should lessen with repetition over time.  Shoulder-Lifts. Lie face down with arms beside your body. Keep hips and torso pressed to floor as you slowly lift your head and shoulders off the floor. Do not overdo your exercises, especially in the beginning. Exercises may cause you some mild back discomfort which lasts for a few minutes; however, if the pain is more severe, or lasts for more than 15 minutes, do not continue exercises until you see your caregiver.  Improvement with exercise therapy for back problems is slow.  See your caregivers for assistance with developing a proper back exercise program. Document Released: 06/23/2004 Document Revised: 08/08/2011 Document Reviewed: 03/17/2011 [facility] Patient Information 2015 [place], [employer]. This information is not intended to replace advice given to you by your health care provider. Make sure you discuss any questions you have with your health care provider. Radicular Pain Radicular pain in either the arm or leg is usually from a bulging or herniated disk in the spine. A piece of the herniated disk may press against the nerves as the nerves exit the spine. This causes pain which is felt at the tips of the nerves down the arm or leg. Other causes of radicular pain may include:  Fractures.  Heart disease.  Cancer.  An abnormal and usually degenerative state of the nervous system or nerves (neuropathy). Diagnosis may require CT or MRI scanning to determine the primary cause.  Nerves that start at the neck (nerve roots) may cause radicular pain in the outer shoulder and arm. It can spread down to the thumb and fingers. The symptoms vary depending on which nerve root has been affected. In most cases radicular pain improves with conservative treatment. Neck problems may require physical therapy, a neck collar, or cervical traction. Treatment may take many weeks, and surgery may be considered if the symptoms do not improve.  Conservative treatment is also recommended for sciatica. Sciatica  causes pain to radiate from the lower back or buttock area down the leg into the foot. Often there is a history of back problems. Most patients with sciatica are better after 2 to 4 weeks of rest and other supportive care. Short term bed rest can reduce the disk pressure considerably. Sitting, however, is not a good position since this increases the pressure on the disk. You should avoid bending, lifting, and all other  activities which make the problem worse. Traction can be used in severe cases. Surgery is usually reserved for patients who do not improve within the first months of treatment. °Only take over-the-counter or prescription medicines for pain, discomfort, or fever as directed by your caregiver. Narcotics and muscle relaxants may help by relieving more severe pain and spasm and by providing mild sedation. Cold or massage can give significant relief. Spinal manipulation is not recommended. It can increase the degree of disc protrusion. Epidural steroid injections are often effective treatment for radicular pain. These injections deliver medicine to the spinal nerve in the space between the protective covering of the spinal cord and back bones (vertebrae). Your caregiver can give you more information about steroid injections. These injections are most effective when given within two weeks of the onset of pain.  °You should see your caregiver for follow up care as recommended. A program for neck and back injury rehabilitation with stretching and strengthening exercises is an important part of management.  °SEEK IMMEDIATE MEDICAL CARE IF: °· You develop increased pain, weakness, or numbness in your arm or leg. °· You develop difficulty with bladder or bowel control. °· You develop abdominal pain. °Document Released: 06/23/2004 Document Revised: 08/08/2011 Document Reviewed: 09/08/2008 °ExitCare® Patient Information ©2015 [employer]. This information is not intended to replace advice given to you by your health care provider. Make sure you discuss any questions you have with your health care provider. ° °

## 2015-01-07 NOTE — ED Provider Notes (Signed)
CSN: [id number]     Arrival date & time 01/07/15  1505 History  This chart was scribed for non-physician practitioner, Everlene Farrier, PA-C working with Melene Plan, DO by Gwenyth Ober, ED scribe. This patient was seen in room WTR8/WTR8 and the patient's care was started at 4:25 PM   No chief complaint on file. The history is provided by the patient. No language interpreter was used.    HPI Comments: Cody Villarreal is a 29 y.o. male who presents to the Emergency Department complaining of intermittent, 6/10, bilateral posterior whole leg pain and tingling that started a few months ago, but became constant 2 days ago. He states intermittent tingling of his bilateral arms that started several months ago, but is resolved today, as an associated symptom. His pain becomes worse with walking and sitting for long periods. Pt has not tried any treatment PTA. 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 He has not followed up with his surgeon regarding the new pain. Pt denies recent injuries, falls, a history of cancer and a history of IV drug use. He is compliant with HIV medications. Pt denies back pain, fever, numbness, difficulty urinating, hematuria, dysuria, bladder or bowel incontinence, frequency, urgency, weakness, and numbness or tingling of his groin as associated symptoms.   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 HIV disease   . Patella-femoral syndrome    Past Surgical History  Procedure Laterality Date  . Back surgery     History reviewed. No pertinent family history. Social History  Substance Use Topics  . Smoking status: Current Every Day Smoker -- 0.50 packs/day    Types: Cigarettes  . Smokeless tobacco: None  . Alcohol Use: Yes     Comment: occasional    Review of Systems  Constitutional: Negative for fever and chills.  Gastrointestinal: Negative for nausea, vomiting and abdominal pain.  Genitourinary:  Negative for dysuria, urgency, frequency, hematuria and difficulty urinating.  Musculoskeletal: Positive for arthralgias. Negative for back pain.  Skin: Negative for wound.  Neurological: Negative for weakness and numbness.   Allergies  Review of patient's allergies indicates no known allergies.  Home Medications   Prior to Admission medications   Medication Sig Start Date End Date Taking? Authorizing Provider  elvitegravir-cobicistat-emtricitabine-tenofovir (STRIBILD) 150-150-200-300 MG TABS Take 1 tablet by mouth at bedtime.   Yes Historical Provider, MD  methocarbamol (ROBAXIN) 500 MG tablet Take 1 tablet (500 mg total) by mouth 2 (two) times daily as needed for muscle spasms. 01/07/15   Everlene Farrier, PA-C  naproxen (NAPROSYN) 250 MG tablet Take 1 tablet (250 mg total) by mouth 2 (two) times daily with a meal. 01/07/15   Everlene Farrier, PA-C   BP 127/77 mmHg  Pulse 61  Temp(Src) 98.6 F (37 C) (Oral)  Resp 20  SpO2 99% Physical Exam  Constitutional: He is oriented to person, place, and time. He appears well-developed and well-nourished. No distress.  HENT:  Head: Normocephalic and atraumatic.  Mouth/Throat: Oropharynx is clear and moist. No oropharyngeal exudate.  Eyes: Conjunctivae are normal. Pupils are equal, round, and reactive to light. Right eye exhibits no discharge. Left eye exhibits no discharge.  Neck: Neck supple.  No midline neck tenderness  Cardiovascular: Normal rate, regular rhythm, normal heart sounds and intact distal pulses.   Pulses:      Posterior tibial pulses are 2+ on the right side, and 2+ on the left side.  Pulmonary/Chest: Effort normal and breath sounds normal. No respiratory distress.  Abdominal: Soft. There is no tenderness.  Musculoskeletal:  No midline back tenderness No back erythema, deformity or ecchymosis No calf tenderness or swelling  Lymphadenopathy:    He has no cervical adenopathy.  Neurological: He is alert and oriented to person,  place, and time. No cranial nerve deficit. He exhibits normal muscle tone. Coordination normal.  Bilateral grip strengths equal Upper extremities 5/5 strength Lower extremities 5/5 strength Bilateral patellar DTRs intact Ambulate without difficulty or assistance, normal gait  Skin: Skin is warm and dry. No rash noted. He is not diaphoretic.  Psychiatric: He has a normal mood and affect. His behavior is normal.  Nursing note and vitals reviewed.   ED Course  Procedures   DIAGNOSTIC STUDIES: Oxygen Saturation is 100% on RA, normal by my interpretation.    COORDINATION OF CARE: 4:35 PM Discussed treatment plan with pt which includes Naprosyn and Robaxin. Pt agreed to plan.  Labs Review Labs Reviewed - No data to display  Imaging Review No results found.   EKG Interpretation None      Filed Vitals:   01/07/15 1524 01/07/15 1640  BP: 132/77 127/77  Pulse: 57 61  Temp: 98.6 F (37 C)   TempSrc: Oral   Resp: 18 20  SpO2: 100% 99%     MDM   Meds given in ED:  Medications  acetaminophen (TYLENOL) tablet 500 mg (500 mg Oral Given 01/07/15 1639)    Discharge Medication List as of 01/07/2015  4:36 PM    START taking these medications   Details  methocarbamol (ROBAXIN) 500 MG tablet Take 1 tablet (500 mg total) by mouth 2 (two) times daily as needed for muscle spasms., Starting 01/07/2015, Until Discontinued, Print    naproxen (NAPROSYN) 250 MG tablet Take 1 tablet (250 mg total) by mouth 2 (two) times daily with a meal., Starting 01/07/2015, Until Discontinued, Print        Final diagnoses:  Radicular pain of both lower extremities   This  is a 29 y.o. male who presents to the Emergency Department complaining of intermittent, 6/10, bilateral posterior whole leg pain and tingling that started a few months ago, but became constant 2 days ago. He states intermittent tingling of his bilateral arms that started several months ago, but is resolved today, as an associated  symptom. His pain becomes worse with walking and sitting for long periods. Pt has not tried any treatment PTA. On exam the patient is afebrile nontoxic appearing.  Patient bilateral posterior leg pain. He denies any back pain.   No neurological deficits and normal neuro exam.  Patient can walk without difficulty or assistance and with normal gait. No loss of bowel or bladder control.  No concern for cauda equina.  No fever, night sweats, weight loss, h/o cancer, IVDU.  RICE protocol and pain medicine indicated and discussed with patient. Will discharge with naproxen and Robaxin and have him follow-up with his orthopedic surgeon and PCP.  He is compliant with his HIV medications. I advised the patient to follow-up with their primary care provider this week. I advised the patient to return to the emergency department with new or worsening symptoms or new concerns. The patient verbalized understanding and agreement with plan.    I personally performed the services described in this documentation, which was scribed in my presence. The recorded information has been reviewed and is accurate.    Everlene Farrier, PA-C 01/07/15 1914  Jesusita Oka  Adela Lank, DO 01/07/15 2258

## 2015-01-07 NOTE — ED Notes (Signed)
Pt states he has a hx of a slipped disk ("near the top of my spine") with surgery to it in April. States over the last two days the pain in his legs has gotten worse and he's beginning to have bilateral arm tingling. Says he believes the disk may have slipped again. Denies chest pain, SOB, N/V/D, fever/chills, bladder/bowel incontinence, difficulty moving or numbness to extremities.

## 2015-02-11 ENCOUNTER — Emergency Department (HOSPITAL_COMMUNITY)
Admission: EM | Admit: 2015-02-11 | Discharge: 2015-02-11 | Disposition: A | Discharge status: Home / Self Care | Payer: Self-pay | Attending: Emergency Medicine | Admitting: Emergency Medicine

## 2015-02-11 ENCOUNTER — Encounter (HOSPITAL_COMMUNITY): Payer: Self-pay | Admitting: *Deleted

## 2015-02-11 DIAGNOSIS — M79642 Pain in left hand: Secondary | ICD-10-CM | POA: Insufficient documentation

## 2015-02-11 DIAGNOSIS — R200 Anesthesia of skin: Secondary | ICD-10-CM | POA: Insufficient documentation

## 2015-02-11 DIAGNOSIS — G629 Polyneuropathy, unspecified: Secondary | ICD-10-CM

## 2015-02-11 DIAGNOSIS — M542 Cervicalgia: Secondary | ICD-10-CM | POA: Insufficient documentation

## 2015-02-11 DIAGNOSIS — Z21 Asymptomatic human immunodeficiency virus [HIV] infection status: Secondary | ICD-10-CM | POA: Insufficient documentation

## 2015-02-11 DIAGNOSIS — Z79899 Other long term (current) drug therapy: Secondary | ICD-10-CM | POA: Insufficient documentation

## 2015-02-11 DIAGNOSIS — G9009 Other idiopathic peripheral autonomic neuropathy: Secondary | ICD-10-CM | POA: Insufficient documentation

## 2015-02-11 DIAGNOSIS — M79641 Pain in right hand: Secondary | ICD-10-CM | POA: Insufficient documentation

## 2015-02-11 DIAGNOSIS — Z720 Tobacco use: Secondary | ICD-10-CM | POA: Insufficient documentation

## 2015-02-11 MED ORDER — GABAPENTIN 300 MG PO CAPS: 300.0000 mg | ORAL_CAPSULE | Freq: Three times a day (TID) | ORAL | Status: DC

## 2015-02-11 MED ORDER — NON FORMULARY: 1.0000 | Freq: Every day | Status: DC

## 2015-02-11 MED ORDER — ELVITEG-COBIC-EMTRICIT-TENOFAF 150-150-200-10 MG PO TABS
1.0000 | ORAL_TABLET | Freq: Every day | ORAL | Status: DC
  Administered 2015-02-11: 1 via ORAL
  Filled 2015-02-11: qty 1

## 2015-02-11 NOTE — Discharge Instructions (Signed)

## 2015-02-11 NOTE — ED Provider Notes (Signed)
CSN: [id number]     Arrival date & time 02/11/15  1858 History   First MD Initiated Contact with Patient 02/11/15 2141     Chief Complaint  Patient presents with  . Back Pain  . Hand Pain    (Consider location/radiation/quality/duration/timing/severity/associated sxs/prior Treatment) HPI Comments: Patient is a 29 year old male with a history of HIV (last CD4 count in the 800's) who presents to the emergency department for further evaluation of low back pain with burning pain in his bilateral lower extremities and feet. Patient has also had similar pain in his left hand. He states that pain is worse with movement and activity. He states that he has had difficulty driving at times because his pain worsens when pushing on the gas pedal. He states that he is able to shake his left hand and stretch his hand to relieve his symptoms. He notices that the pain in his bilateral lower extremities and feet worsens when his low back pain worsens. Patient has a history of cervical fusion. He does report similar pains prior to this procedure. He denies any direct trauma or injury to his back or neck. He has also been trying naproxen and Robaxin for symptoms without relief. Patient has been on Stribild for the past 3 years. He states that his infectious disease doctor does not believe his symptoms are associated with this medication because they began approximately a year or so after initiating treatment; however, per side effect profile, numbness, tingling, and pain of hands/feet is considered to be a severe side effect. Patient denies associated fever, extremity weakness, or bowel/bladder incontinence.  Patient is a 29 y.o. male presenting with back pain and hand pain. The history is provided by the patient. No language interpreter was used.  Back Pain Associated symptoms: numbness   Associated symptoms: no fever and no weakness   Hand Pain Associated symptoms include myalgias, neck pain and numbness. Pertinent  negatives include no fever or weakness.    Past Medical History  Diagnosis Date  . HIV disease   . Patella-femoral syndrome    Past Surgical History  Procedure Laterality Date  . Back surgery     No family history on file. Social History  Substance Use Topics  . Smoking status: Current Every Day Smoker -- 0.50 packs/day    Types: Cigarettes  . Smokeless tobacco: Never Used  . Alcohol Use: No    Review of Systems  Constitutional: Negative for fever.  Musculoskeletal: Positive for myalgias, back pain and neck pain.  Neurological: Positive for numbness. Negative for weakness.       +paresthesias of hands/feet  All other systems reviewed and are negative.   Allergies  Review of patient's allergies indicates no known allergies.  Home Medications   Prior to Admission medications   Medication Sig Start Date End Date Taking? Authorizing Provider  elvitegravir-cobicistat-emtricitabine-tenofovir (STRIBILD) 150-150-200-300 MG TABS Take 1 tablet by mouth at bedtime.   Yes Historical Provider, MD  gabapentin (NEURONTIN) 300 MG capsule Take 1 capsule (300 mg total) by mouth 3 (three) times daily. Take 300mg  at night on day 1, 300mg  twice a day on day 2, 300mg  three times on day 3 and every day thereafter until you see your primary doctor 02/11/15   Antony Madura, PA-C   BP 118/73 mmHg  Pulse 52  Temp(Src) 98 F (36.7 C) (Oral)  Resp 18  Ht 5\' 9"  (1.753 m)  Wt 124 lb (56.246 kg)  BMI 18.30 kg/m2  SpO2 100%   Physical  Exam  Constitutional: He is oriented to person, place, and time. He appears well-developed and well-nourished. No distress.  Nontoxic/nonseptic appearing  HENT:  Head: Normocephalic and atraumatic.  Eyes: Conjunctivae and EOM are normal. No scleral icterus.  Neck: Normal range of motion.  No tenderness to palpation of the cervical midline. No bony deformities, step-offs, or crepitus  Cardiovascular: Normal rate, regular rhythm and intact distal pulses.    Pulmonary/Chest: Effort normal. No respiratory distress.  Respirations even and unlabored  Musculoskeletal: Normal range of motion. He exhibits tenderness.  No tenderness to palpation to the thoracic or lumbar midline. No bony deformities, step-offs, or crepitus.  Neurological: He is alert and oriented to person, place, and time. He exhibits normal muscle tone. Coordination normal.  Sensation to light touch intact in all extremities. Patient has 5/5 grip strength bilaterally. He is ambulatory with steady gait.  Skin: Skin is warm and dry. No rash noted. He is not diaphoretic. No erythema. No pallor.  Psychiatric: He has a normal mood and affect. His behavior is normal.  Nursing note and vitals reviewed.   ED Course  Procedures (including critical care time) Labs Review Labs Reviewed - No data to display  Imaging Review No results found.   I have personally reviewed and evaluated these images and lab results as part of my medical decision-making.   EKG Interpretation None       Medications  elvitegravir-cobicistat-emtricitabine-tenofovir (GENVOYA) 150-150-200-10 MG tablet 1 tablet (1 tablet Oral Given 02/11/15 2227)    MDM   Final diagnoses:  Peripheral neuropathy    29 year old male presents to the emergency department for evaluation of symptoms consistent with peripheral neuropathy. Symptoms have been present over the last year. Patient is neurovascularly intact today. He has no focal neurologic deficits on exam. No red flags or signs concerning for cauda equina. Patient afebrile. I have a large suspicion that patient's symptoms are a side effect of his Stribild. Have recommended that he discuss his symptoms with his infectious disease doctor. Will place patient on a course of Neurontin until this time. He does have orthopedic follow-up scheduled in 2 weeks. No indication for further emergent workup at this time. Patient stable for discharge; discharged in good  condition.   Filed Vitals:   02/11/15 2215 02/11/15 2230 02/11/15 2245 02/11/15 2252  BP: 131/79 121/69 118/73   Pulse: 62 58 52   Temp:    98 F (36.7 C)  TempSrc:      Resp:      Height:      Weight:      SpO2: 100% 100% 100%      Antony Madura, PA-C 02/11/15 2316  Vanetta Mulders, MD 02/12/15 1620

## 2015-02-11 NOTE — ED Notes (Signed)
Patient presents with c/o lower back pain and bilateral hand pain for about 4 months.  Also stated his feet hurt at times.

## 2015-04-11 ENCOUNTER — Encounter (HOSPITAL_COMMUNITY): Payer: Self-pay

## 2015-04-11 ENCOUNTER — Emergency Department (HOSPITAL_COMMUNITY)
Admission: EM | Admit: 2015-04-11 | Discharge: 2015-04-11 | Disposition: A | Discharge status: Home / Self Care | Payer: Medicaid Other | Attending: Emergency Medicine | Admitting: Emergency Medicine

## 2015-04-11 DIAGNOSIS — Z8739 Personal history of other diseases of the musculoskeletal system and connective tissue: Secondary | ICD-10-CM | POA: Insufficient documentation

## 2015-04-11 DIAGNOSIS — Z79899 Other long term (current) drug therapy: Secondary | ICD-10-CM | POA: Diagnosis not present

## 2015-04-11 DIAGNOSIS — B20 Human immunodeficiency virus [HIV] disease: Secondary | ICD-10-CM | POA: Insufficient documentation

## 2015-04-11 DIAGNOSIS — G629 Polyneuropathy, unspecified: Secondary | ICD-10-CM | POA: Diagnosis not present

## 2015-04-11 DIAGNOSIS — Z720 Tobacco use: Secondary | ICD-10-CM | POA: Insufficient documentation

## 2015-04-11 DIAGNOSIS — M79605 Pain in left leg: Secondary | ICD-10-CM | POA: Diagnosis present

## 2015-04-11 MED ORDER — HYDROCODONE-ACETAMINOPHEN 5-325 MG PO TABS: 2.0000 | ORAL_TABLET | ORAL | Status: DC | PRN

## 2015-04-11 NOTE — Discharge Instructions (Signed)
Peripheral Neuropathy Keep your follow up appointment with neurology on Friday.   Peripheral neuropathy is a type of nerve damage. It affects nerves that carry signals between the spinal cord and other parts of the body. These are called peripheral nerves. With peripheral neuropathy, one nerve or a group of nerves may be damaged.  CAUSES  Many things can damage peripheral nerves. For some people with peripheral neuropathy, the cause is unknown. Some causes include:  Diabetes. This is the most common cause of peripheral neuropathy.  Injury to a nerve.  Pressure or stress on a nerve that lasts a long time.  Too little vitamin B. Alcoholism can lead to this.  Infections.  Autoimmune diseases, such as multiple sclerosis and systemic lupus erythematosus.  Inherited nerve diseases.  Some medicines, such as cancer drugs.  Toxic substances, such as lead and mercury.  Too little blood flowing to the legs.  Kidney disease.  Thyroid disease. SIGNS AND SYMPTOMS  Different people have different symptoms. The symptoms you have will depend on which of your nerves is damaged. Common symptoms include:  Loss of feeling (numbness) in the feet and hands.  Tingling in the feet and hands.  Pain that burns.  Very sensitive skin.  Weakness.  Not being able to move a part of the body (paralysis).  Muscle twitching.  Clumsiness or poor coordination.  Loss of balance.  Not being able to control your bladder.  Feeling dizzy.  Sexual problems. DIAGNOSIS  Peripheral neuropathy is a symptom, not a disease. Finding the cause of peripheral neuropathy can be hard. To figure that out, your health care provider will take a medical history and do a physical exam. A neurological exam will also be done. This involves checking things affected by your brain, spinal cord, and nerves (nervous system). For example, your health care provider will check your reflexes, how you move, and what you can feel.    Other types of tests may also be ordered, such as:  Blood tests.  A test of the fluid in your spinal cord.  Imaging tests, such as CT scans or an MRI.  Electromyography (EMG). This test checks the nerves that control muscles.  Nerve conduction velocity tests. These tests check how fast messages pass through your nerves.  Nerve biopsy. A small piece of nerve is removed. It is then checked under a microscope. TREATMENT   Medicine is often used to treat peripheral neuropathy. Medicines may include:  Pain-relieving medicines. Prescription or over-the-counter medicine may be suggested.  Antiseizure medicine. This may be used for pain.  Antidepressants. These also may help ease pain from neuropathy.  Lidocaine. This is a numbing medicine. You might wear a patch or be given a shot.  Mexiletine. This medicine is typically used to help control irregular heart rhythms.  Surgery. Surgery may be needed to relieve pressure on a nerve or to destroy a nerve that is causing pain.  Physical therapy to help movement.  Assistive devices to help movement. HOME CARE INSTRUCTIONS   Only take over-the-counter or prescription medicines as directed by your health care provider. Follow the instructions carefully for any given medicines. Do not take any other medicines without first getting approval from your health care provider.  If you have diabetes, work closely with your health care provider to keep your blood sugar under control.  If you have numbness in your feet:  Check every day for signs of injury or infection. Watch for redness, warmth, and swelling.  Wear padded socks  and comfortable shoes. These help protect your feet.  Do not do things that put pressure on your damaged nerve.  Do not smoke. Smoking keeps blood from getting to damaged nerves.  Avoid or limit alcohol. Too much alcohol can cause a lack of B vitamins. These vitamins are needed for healthy nerves.  Develop a good  support system. Coping with peripheral neuropathy can be stressful. Talk to a mental health specialist or join a support group if you are struggling.  Follow up with your health care provider as directed. SEEK MEDICAL CARE IF:   You have new signs or symptoms of peripheral neuropathy.  You are struggling emotionally from dealing with peripheral neuropathy.  You have a fever. SEEK IMMEDIATE MEDICAL CARE IF:   You have an injury or infection that is not healing.  You feel very dizzy or begin vomiting.  You have chest pain.  You have trouble breathing.   This information is not intended to replace advice given to you by your health care provider. Make sure you discuss any questions you have with your health care provider.   Document Released: 05/06/2002 Document Revised: 01/26/2011 Document Reviewed: 01/21/2013 Elsevier Interactive Patient Education [employer]

## 2015-04-11 NOTE — ED Notes (Signed)
Pt reported BLE pain and numbness. Pt denies injury/trauma. Full weight bearing. (+)PMS, CRT brisk, no swelling/bruising noted. Pt reported having spinal fusion. [facility] and PMD was dx with neuropathy. Pt has f/u appt on next Friday with neurologist.

## 2015-04-11 NOTE — ED Notes (Signed)
He c/o polyarthralgias and swelling of bilat. L.e. X over one month.  [place] it is neuropathy r/t h.i.v. And its treatment.  They recently prescribed neurontin, "but it just isn't controlling my pain".  He is in no distress.

## 2015-04-11 NOTE — ED Provider Notes (Signed)
CSN: [id number]     Arrival date & time 04/11/15  1336 History   First MD Initiated Contact with Patient 04/11/15 1520     Chief Complaint  Patient presents with  . Joint Pain     (Consider location/radiation/quality/duration/timing/severity/associated sxs/prior Treatment) The history is provided by the patient. No language interpreter was used.  Cody Villarreal is a 29 y.o male with a history of HIV and patella-femoral syndrome who presents for burning pain and numbness in his bilateral lower extremities that has been ongoing for several months. He states he was put on gabapentin with minimal relief. He also takes several medications for HIV but states that he was told by his PCP that his symptoms were not related to the medications prescribed. He states that he works at [employer] and is in pain all day. He has tried ibuprofen 800 mg with minimal relief. He also mentioned that he has a neurology appointment in 6 days. He states his physicians here and in [place] have suggested that this may be a neuropathy but has not had a final diagnosis. His last CD4 count was 900 and his viral load was undetectable. He denies any fever, chills, chest pain, shortness of breath, abdominal pain, nausea, vomiting, or diarrhea.  Past Medical History  Diagnosis Date  . HIV disease (HCC)   . Patella-femoral syndrome    Past Surgical History  Procedure Laterality Date  . Back surgery     No family history on file. Social History  Substance Use Topics  . Smoking status: Current Every Day Smoker -- 0.50 packs/day    Types: Cigarettes  . Smokeless tobacco: Never Used  . Alcohol Use: No    Review of Systems  Constitutional: Negative for fever.  Respiratory: Negative for shortness of breath.   Musculoskeletal: Positive for myalgias. Negative for gait problem.  Skin: Negative for rash.  Neurological: Negative for weakness.  All other systems reviewed and are negative.     Allergies  Review of  patient's allergies indicates no known allergies.  Home Medications   Prior to Admission medications   Medication Sig Start Date End Date Taking? Authorizing Provider  elvitegravir-cobicistat-emtricitabine-tenofovir (STRIBILD) 150-150-200-300 MG TABS Take 1 tablet by mouth at bedtime.   Yes Historical Provider, MD  gabapentin (NEURONTIN) 300 MG capsule Take 1 capsule (300 mg total) by mouth 3 (three) times daily. Take  at night on day 1,  twice a day on day 2,  three times on day 3 and every day thereafter until you see your primary doctor 02/11/15  Yes Antony Madura, PA-C  HYDROcodone-acetaminophen (NORCO/VICODIN) 5-325 MG tablet Take 2 tablets by mouth every 4 (four) hours as needed. 04/11/15   Roise Emert Patel-Mills, PA-C   BP 121/65 mmHg  Pulse 63  Temp(Src) 97.9 F (36.6 C) (Oral)  Resp 18  SpO2 100% Physical Exam  Constitutional: He is oriented to person, place, and time. He appears well-developed and well-nourished.  HENT:  Head: Normocephalic and atraumatic.  Eyes: Conjunctivae are normal.  Neck: Normal range of motion. Neck supple.  Cardiovascular: Normal rate, regular rhythm and normal heart sounds.   Pulmonary/Chest: Effort normal and breath sounds normal. No respiratory distress. He has no wheezes. He has no rales.  No shortness of breath or trouble breathing. Lungs are clear to auscultation bilaterally.  Abdominal: Soft. There is no tenderness.  Musculoskeletal: Normal range of motion. He exhibits no edema or tenderness.  Tenderness along the entire bilateral lower extremities. No focal tenderness to the calf.  No swelling of the calf or lower extremities. 2+ DP pulses bilaterally. Able to move extremities by flexing and extending at the knee, dorsi and plantar flexing, and ambulating.  Neurological: He is alert and oriented to person, place, and time.  Skin: Skin is warm and dry.  Nursing note and vitals reviewed.   ED Course  Procedures (including critical care  time) Labs Review Labs Reviewed - No data to display  Imaging Review No results found.   EKG Interpretation None      MDM   Final diagnoses:  Peripheral polyneuropathy [facility](HCC)   Patient presents for bilateral lower extremity pain and numbness for the past couple of months. He states he has been taking gabapentin with minimal relief. He was seen here 2 months ago for the same. It was thought then that it may be related to his HIV medications. He states he was seen by his PCP after that and was told that they did not think it was related to his meds. He has a neurology follow-up appointment in 6 days. 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 I explained that I would prescribe him a short course of Norco to get him through the week. He also requested a work note. He is well-appearing and in no acute distress. He is ambulatory and I do not suspect PE or DVT. I explained that he should keep his follow-up appointment and he agrees with the plan.     Catha GosselinHanna Patel-Mills, PA-C 04/11/15 1815  Melene Planan Floyd, DO 04/11/15 2359

## 2015-05-01 ENCOUNTER — Encounter (HOSPITAL_COMMUNITY): Payer: Self-pay

## 2015-05-01 ENCOUNTER — Emergency Department (HOSPITAL_COMMUNITY)
Admission: EM | Admit: 2015-05-01 | Discharge: 2015-05-01 | Disposition: A | Discharge status: Home / Self Care | Payer: Medicaid Other | Attending: Emergency Medicine | Admitting: Emergency Medicine

## 2015-05-01 DIAGNOSIS — M792 Neuralgia and neuritis, unspecified: Secondary | ICD-10-CM

## 2015-05-01 DIAGNOSIS — B20 Human immunodeficiency virus [HIV] disease: Secondary | ICD-10-CM | POA: Insufficient documentation

## 2015-05-01 DIAGNOSIS — Z79899 Other long term (current) drug therapy: Secondary | ICD-10-CM | POA: Insufficient documentation

## 2015-05-01 DIAGNOSIS — G629 Polyneuropathy, unspecified: Secondary | ICD-10-CM | POA: Insufficient documentation

## 2015-05-01 DIAGNOSIS — F17210 Nicotine dependence, cigarettes, uncomplicated: Secondary | ICD-10-CM | POA: Insufficient documentation

## 2015-05-01 MED ORDER — ACETAMINOPHEN 500 MG PO TABS: 500.0000 mg | ORAL_TABLET | Freq: Four times a day (QID) | ORAL | Status: DC | PRN

## 2015-05-01 MED ORDER — ACETAMINOPHEN 325 MG PO TABS
650.0000 mg | ORAL_TABLET | Freq: Once | ORAL | Status: AC
  Administered 2015-05-01: 650 mg via ORAL
  Filled 2015-05-01: qty 2

## 2015-05-01 NOTE — ED Notes (Signed)
Pt with ble pain - legs and feet.  Pt states he was dx with neuropathy 2 weeks ago and given meds.  meds not working.  Pain continues

## 2015-05-01 NOTE — ED Provider Notes (Signed)
CSN: [id number]     Arrival date & time 05/01/15  1511 History  By signing my name below, I, Placido Sou, attest that this documentation has been prepared under the direction and in the presence of Elizabeth C. Westfall, PA-C. Electronically Signed: Placido Sou, ED Scribe. 05/01/2015. 4:15 PM.    Chief Complaint  Patient presents with  . Leg Pain    The history is provided by the patient. No language interpreter was used.     HPI Comments: Cody Villarreal is a 29 y.o. male with a hx of HIV and neuropathy who presents to the Emergency Department complaining of constant, moderate, diffuse, bilateral leg pain with onset 1 year ago. He was dx by his neurologist 2 weeks ago with neuropathy (follow up appointment on 12/27) for the same symptoms noting that he has been taking an increasing amount of gabapentin and duloxetine which have provided minimal relief. He notes associated numbness and tingling and describes his pain as "burning". Pt also notes some bilateral ankle swelling which has resolved. He denies any alleviating factors. Pt notes taking his HAART medications as prescribed further noting that he saw his regular provider in September and his viral load was undetectable at that time. He denies fevers, chills, or other associated symptoms at this time.   Past Medical History  Diagnosis Date  . HIV disease (HCC)   . Patella-femoral syndrome    Past Surgical History  Procedure Laterality Date  . Back surgery     History reviewed. No pertinent family history. Social History  Substance Use Topics  . Smoking status: Current Every Day Smoker -- 0.50 packs/day    Types: Cigarettes  . Smokeless tobacco: Never Used  . Alcohol Use: No    Review of Systems  Constitutional: Negative for fever and chills.  Musculoskeletal: Positive for myalgias.  Allergic/Immunologic: Positive for immunocompromised state.    Allergies  Review of patient's allergies indicates no known  allergies.  Home Medications   Prior to Admission medications   Medication Sig Start Date End Date Taking? Authorizing Provider  acetaminophen (TYLENOL) 500 MG tablet Take 1 tablet (500 mg total) by mouth every 6 (six) hours as needed. 05/01/15   Mady Gemma, PA-C  elvitegravir-cobicistat-emtricitabine-tenofovir (STRIBILD) 150-150-200-300 MG TABS Take 1 tablet by mouth at bedtime.    Historical Provider, MD  gabapentin (NEURONTIN) 300 MG capsule Take 1 capsule (300 mg total) by mouth 3 (three) times daily. Take  at night on day 1,  twice a day on day 2,  three times on day 3 and every day thereafter until you see your primary doctor 02/11/15   Antony Madura, PA-C  HYDROcodone-acetaminophen (NORCO/VICODIN) 5-325 MG tablet Take 2 tablets by mouth every 4 (four) hours as needed. 04/11/15   Hanna Patel-Mills, PA-C    BP 142/87 mmHg  Pulse 78  Temp(Src) 98.3 F (36.8 C) (Oral)  Resp 20  SpO2 97%  Physical Exam  Constitutional: He is oriented to person, place, and time. He appears well-developed and well-nourished. No distress.  HENT:  Head: Normocephalic and atraumatic.  Right Ear: External ear normal.  Left Ear: External ear normal.  Nose: Nose normal.  Eyes: Conjunctivae and EOM are normal. Right eye exhibits no discharge. Left eye exhibits no discharge. No scleral icterus.  Neck: Normal range of motion. Neck supple.  Cardiovascular: Normal rate, regular rhythm and intact distal pulses.   Pulmonary/Chest: Effort normal and breath sounds normal. No respiratory distress.  Musculoskeletal: Normal range of motion. He exhibits  no edema or tenderness.  No edema, erythema, or TTP of lower extremities bilaterally. Distal pulses intact. Strength and sensation intact.  Neurological: He is alert and oriented to person, place, and time. He has normal strength. No sensory deficit.  Skin: Skin is warm and dry. He is not diaphoretic.  Psychiatric: He has a normal mood and affect.  His behavior is normal.  Nursing note and vitals reviewed.   ED Course  Procedures   DIAGNOSTIC STUDIES: Oxygen Saturation is 97% on RA, normal by my interpretation.    COORDINATION OF CARE: 4:14 PM Pt presents today due to bilateral leg pain consistent with his neuropathy. Discussed next steps with pt at bedside. Return precautions noted. Pt agreed to plan.  Labs Review Labs Reviewed - No data to display  Imaging Review No results found.     EKG Interpretation None      MDM   Final diagnoses:  Peripheral neuropathic pain (HCC)    29 year old male presents with bilateral lower extremity pain, which he states is consistent with his history of peripheral neuropathy. He denies recent injury. He denies change in his symptoms since he was evaluated by neurology and diagnosed with peripheral neuropathy. He states he is taking gabapentin and duloxetine for his pain, with no significant relief. Patient is afebrile. Vital signs stable. No lower extremity edema or erythema. No significant tenderness to palpation of lower extremities. Full range of motion of lower extemities. Strength and sensation to light touch intact. Distal pulses intact. Patient ambulates without difficulty.  Do not feel further evaluation with imaging or additional studies is indicated at this time, given no change in symptoms and benign exam. Advised patient to follow up with neurology for possible medication adjustment, and in the meantime, will give tylenol for additional symptom relief. Return precautions discussed. Patient verbalizes his understanding and is in agreement with plan.  BP 142/87 mmHg  Pulse 78  Temp(Src) 98.3 F (36.8 C) (Oral)  Resp 20  SpO2 97%  I personally performed the services described in this documentation, which was scribed in my presence. The recorded information has been reviewed and is accurate.   Mady Gemmalizabeth C Westfall, PA-C 05/01/15 1638  Mancel BaleElliott Went[place], MD 05/01/15 [id number]

## 2015-05-01 NOTE — Discharge Instructions (Signed)
1. Medications: tylenol, usual home medications 2. Treatment: rest, drink plenty of fluids 3. Follow Up: please followup with your primary doctor for discussion of your diagnoses and further evaluation after today's visit; if you do not have a primary care doctor use the resource guide provided to find one; please return to the ER for severe pain, increased numbness, weakness, swelling, new or worsening symptoms   Peripheral Neuropathy Peripheral neuropathy is a type of nerve damage. It affects nerves that carry signals between the spinal cord and other parts of the body. These are called peripheral nerves. With peripheral neuropathy, one nerve or a group of nerves may be damaged.  CAUSES  Many things can damage peripheral nerves. For some people with peripheral neuropathy, the cause is unknown. Some causes include:  Diabetes. This is the most common cause of peripheral neuropathy.  Injury to a nerve.  Pressure or stress on a nerve that lasts a long time.  Too little vitamin B. Alcoholism can lead to this.  Infections.  Autoimmune diseases, such as multiple sclerosis and systemic lupus erythematosus.  Inherited nerve diseases.  Some medicines, such as cancer drugs.  Toxic substances, such as lead and mercury.  Too little blood flowing to the legs.  Kidney disease.  Thyroid disease. SIGNS AND SYMPTOMS  Different people have different symptoms. The symptoms you have will depend on which of your nerves is damaged. Common symptoms include:  Loss of feeling (numbness) in the feet and hands.  Tingling in the feet and hands.  Pain that burns.  Very sensitive skin.  Weakness.  Not being able to move a part of the body (paralysis).  Muscle twitching.  Clumsiness or poor coordination.  Loss of balance.  Not being able to control your bladder.  Feeling dizzy.  Sexual problems. DIAGNOSIS  Peripheral neuropathy is a symptom, not a disease. Finding the cause of  peripheral neuropathy can be hard. To figure that out, your health care provider will take a medical history and do a physical exam. A neurological exam will also be done. This involves checking things affected by your brain, spinal cord, and nerves (nervous system). For example, your health care provider will check your reflexes, how you move, and what you can feel.  Other types of tests may also be ordered, such as:  Blood tests.  A test of the fluid in your spinal cord.  Imaging tests, such as CT scans or an MRI.  Electromyography (EMG). This test checks the nerves that control muscles.  Nerve conduction velocity tests. These tests check how fast messages pass through your nerves.  Nerve biopsy. A small piece of nerve is removed. It is then checked under a microscope. TREATMENT   Medicine is often used to treat peripheral neuropathy. Medicines may include:  Pain-relieving medicines. Prescription or over-the-counter medicine may be suggested.  Antiseizure medicine. This may be used for pain.  Antidepressants. These also may help ease pain from neuropathy.  Lidocaine. This is a numbing medicine. You might wear a patch or be given a shot.  Mexiletine. This medicine is typically used to help control irregular heart rhythms.  Surgery. Surgery may be needed to relieve pressure on a nerve or to destroy a nerve that is causing pain.  Physical therapy to help movement.  Assistive devices to help movement. HOME CARE INSTRUCTIONS   Only take over-the-counter or prescription medicines as directed by your health care provider. Follow the instructions carefully for any given medicines. Do not take any other medicines  without first getting approval from your health care provider.  If you have diabetes, work closely with your health care provider to keep your blood sugar under control.  If you have numbness in your feet:  Check every day for signs of injury or infection. Watch for  redness, warmth, and swelling.  Wear padded socks and comfortable shoes. These help protect your feet.  Do not do things that put pressure on your damaged nerve.  Do not smoke. Smoking keeps blood from getting to damaged nerves.  Avoid or limit alcohol. Too much alcohol can cause a lack of B vitamins. These vitamins are needed for healthy nerves.  Develop a good support system. Coping with peripheral neuropathy can be stressful. Talk to a mental health specialist or join a support group if you are struggling.  Follow up with your health care provider as directed. SEEK MEDICAL CARE IF:   You have new signs or symptoms of peripheral neuropathy.  You are struggling emotionally from dealing with peripheral neuropathy.  You have a fever. SEEK IMMEDIATE MEDICAL CARE IF:   You have an injury or infection that is not healing.  You feel very dizzy or begin vomiting.  You have chest pain.  You have trouble breathing.   This information is not intended to replace advice given to you by your health care provider. Make sure you discuss any questions you have with your health care provider.   Document Released: 05/06/2002 Document Revised: 01/26/2011 Document Reviewed: 01/21/2013 Elsevier Interactive Patient Education 2016 [employer].    Emergency Department Resource Guide 1) Find a Doctor and Pay Out of Pocket Although you won't have to find out who is covered by your insurance plan, it is a good idea to ask around and get recommendations. You will then need to call the office and see if the doctor you have chosen will accept you as a new patient and what types of options they offer for patients who are self-pay. Some doctors offer discounts or will set up payment plans for their patients who do not have insurance, but you will need to ask so you aren't surprised when you get to your appointment.  2) Contact Your Local Health Department Not all health departments have doctors that can  see patients for sick visits, but many do, so it is worth a call to see if yours does. If you don't know where your local health department is, you can check in your phone book. The CDC also has a tool to help you locate your state's health department, and many state websites also have listings of all of their local health departments.  3) Find a Walk-in Clinic If your illness is not likely to be very severe or complicated, you may want to try a walk in clinic. These are popping up all over the country in pharmacies, drugstores, and shopping centers. They're usually staffed by nurse practitioners or physician assistants that have been trained to treat common illnesses and complaints. They're usually fairly quick and inexpensive. However, if you have serious medical issues or chronic medical problems, these are probably not your best option.  No Primary Care Doctor: - Call Health Connect at  [phone] - they can help you locate a primary care doctor that  accepts your insurance, provides certain services, etc. - Physician Referral Service- [phone]  Chronic Pain Problems: Organization         Address  Phone   Notes  [facility]  [phone] Patients need to be referred by their primary care doctor.   Medication Assistance: Organization         Address  Phone   Notes  [facility] Medication [facility] [address] [place], [place] [address] [phone] --Must be a resident of [facility] -- Must have NO insurance coverage whatsoever (no Medicaid/ Medicare, etc.) -- The pt. MUST have a primary care doctor that directs their care regularly and follows them in the community   Med[facility]ist  [phone]   [employer]  [phone]    Agencies that provide inexpensive medical care: Organization         Address  Phone   Notes  [place]  [phone]   [facility]    [phone]   [facility] [place], [place] [address] [phone]   [place] [address] [place] [phone]   Planned Parenthood    [phone]   [facility]    [phone]   [place] and [place] [address], [place] Phone:  [phone], Fax:  [phone] Hours of Operation:  9 am - 6 pm, M-F.  Also accepts Medicaid/Medicare and self-pay.  [facility] for [place] [address], [address], [place] Phone: [phone], Fax: [phone]. Hours of Operation:  8:30 am - 5:30 pm, M-F.  Also accepts Medicaid and self-pay.  [facility] [place] [address], [place] Phone: [phone]   [place], [place], [place] [phone], Ext. [phone] Mondays & Thursdays: 7-9 AM.  First 15 patients are seen on a first come, first serve basis.    [place] Providers:  Organization         Address  Phone   Notes  [facility] [address], [place] [phone] Also accepts self-pay patients.  [facility] [address], [place]  [phone]   [place], [address], [place] [phone]   [facility] [address] [place] [phone]   Lucianne Lei [address], [place]   [phone] Only accepts [place] Access [place] patients after they have their name applied to their card.   Self-Pay (no insurance) in [facility]:  Organization         Address  Phone   Notes  Sickle Cell Patients, [facility] [place] [phone]   [facility] [place] [phone]   [facility] [place]  [place], [place], [place] [phone]   [facility]/Dr. Osei-Bonsu   [address], [place] or [address], [place] [phone] Phone number for both [place] and [place] locations is the same.  Urgent Medical and [facility] [address], [place] [phone]   [facility] [address] [place] or [address] [phone]   [facility] [address], [place] [phone], phone; [phone], fax Sees patients 1st and 3rd Saturday of every month.  Must not qualify for public or private insurance (i.e. Medicaid, Medicare, [place] Health Choice, Veterans' Benefits)  Household income should be no more than 200% of the poverty level The clinic cannot treat you if you are pregnant or think you are pregnant  Sexually transmitted diseases are not treated at the clinic.    Dental Care: Organization         Address  Phone  Notes  [facility] Department of [facility] [place] [phone] Accepts children up to age 99 who are enrolled in [place] or [place]; pregnant women with a Medicaid card; and children who have applied for Medicaid or [place] Health Choice, but were declined, whose parents can pay a reduced fee at time of service.  [facility] Department of [facility]  [address], [place] [phone] Accepts children up to age 54 who are enrolled in [place] or [place]; pregnant women with a Medicaid card; and children who have applied for Medicaid or [place] Health Choice, but were declined, whose parents can pay a reduced fee at time of service.  [place] Adult Dental Access PROGRAM  [place] [phone] Patients are seen by appointment only. Walk-ins are not accepted. [place] will see patients 71 years of age and older. Monday - Tuesday (8am-5pm) Most Wednesdays (8:30-5pm) $30 per visit, cash only  [facility] Adult Dental Access  PROGRAM  [address], [facility] [phone] Patients are seen by appointment only. Walk-ins are not accepted. [place] will see patients 64 years of age and older. One Wednesday Evening (Monthly: Volunteer Based).  $30 per visit, cash only  [place]  [phone] for adults; Children under age 9, call Graduate Pediatric Dentistry at [phone]. Children aged 32-14, please call [phone] to request a pediatric application.  Dental services are provided in all areas of dental care including fillings, crowns and bridges, complete and partial dentures, implants, gum treatment, root canals, and extractions. Preventive care is also provided. Treatment is provided to both adults and children. Patients are selected via a lottery and there is often a waiting list.   [facility] [address] [place]  [phone] [email or web address]   [facility] [address] [place], [place] [phone], Ext. [phone] Second and Fourth Thursday of each month, opens at 6:30 AM; Clinic ends at 9 AM.  Patients are seen on a first-come first-served basis, and a limited number are seen during each clinic.   [facility]  [address] Hillard Danker [place], [place] [phone]   Eligibility Requirements You must have lived in [place], [place], or [place] counties for at least the last three months.   You cannot be eligible for state or federal sponsored [employer], including [employer], [place], or [employer].   You generally cannot be eligible for healthcare insurance through your employer.    How to apply: Eligibility screenings are held every Tuesday and Wednesday afternoon from 1:00 pm until 4:00 pm. You do not need an appointment for the interview!  [facility] [address], [place], [place]   [place]  Kinder Department   [place]  [phone]    Behavioral Health Resources in the Community: Intensive Outpatient Programs Organization         Address  Phone  Notes  [place]. [address] [place],  [place] [phone]   [facility] [address], [place], [place]   ADS: Alcohol & Drug Svcs [address] [place], [place]   [place] [address]  [place], [place] or [phone]   Substance Abuse Resources Organization         Address  Phone  Notes  Alcohol and Drug Services  [phone]   [place]  [phone]   [facility] [place]   Chinita Pester  [phone]   Residential & Outpatient Substance Abuse Program  [phone]   Psychological Services Organization         Address  Phone  Notes  [facility]  [place]  [phone]   [place] [address], [place] or [phone]    Mobile Crisis Teams Organization         Address  Phone  Notes  [facility], Mobile Crisis Care Unit  [phone]   [facility] Psychotherapeutic Services  [address] [place], [place]   [place] [address], [place] [phone]    Self-Help/Support Groups Organization         Address  Phone             Notes  [place]. of [place] - variety of support groups  [place] Call for more information  Narcotics Anonymous (NA), [facility] [address], [employer] [place]  2 meetings at this location   Special educational needs teacher         Address  Phone  Notes  [facility]AP Residential Treatment [place],    [place]  [phone]   [facility]  [address], [place] [address], [place], [place]   [place], [place] [phone] Admissions: 8am-3pm M-F  Incentives Substance [place] [address],    [place], [place] [phone]   [facility] [address] [place], [place], [place]   [facility] [address]  [place], [place]   [facility]ight Programs - Intensive Outpatient [address], [place], [place]   [facility] [place].,  [place], [place] [phone] or [phone]   [facility]idential Treatment Services (RTS) [address], [place], [place] Accepts Medicaid  [facility] [place] [address],  [place] [phone] Substance Abuse/Addiction Treatment   [facility] Organization         Address  Phone  Notes  [facility]  [phone]   Domenic Schwab, PhD [address] Arlis Porta [place], [place]   [phone] or [phone]   [place], [place] [phone]   [facility] [address], [place], [place] [phone] Insurance/Medicaid/sponsorship through [facility] and Families [address]                                    [place], [place] [phone] [place] [address], [place] [phone]    Dr. Adele Schilder  [phone]   [facility] Dept. 1) [address] [place] 2) [place]  [address], [place] °3)  [address], [place] [phone] °([phone] ° °([phone]   °[place] Child Abuse Hotline (336) 342-1394 or ([phone] (After Hours)    ° ° ° °

## 2015-05-18 ENCOUNTER — Encounter (HOSPITAL_COMMUNITY): Payer: Self-pay | Admitting: Emergency Medicine

## 2015-05-18 ENCOUNTER — Emergency Department (HOSPITAL_COMMUNITY)
Admission: EM | Admit: 2015-05-18 | Discharge: 2015-05-18 | Disposition: A | Discharge status: Home / Self Care | Payer: Medicaid Other | Attending: Emergency Medicine | Admitting: Emergency Medicine

## 2015-05-18 DIAGNOSIS — M79661 Pain in right lower leg: Secondary | ICD-10-CM | POA: Insufficient documentation

## 2015-05-18 DIAGNOSIS — Z79899 Other long term (current) drug therapy: Secondary | ICD-10-CM | POA: Insufficient documentation

## 2015-05-18 DIAGNOSIS — F17210 Nicotine dependence, cigarettes, uncomplicated: Secondary | ICD-10-CM | POA: Insufficient documentation

## 2015-05-18 DIAGNOSIS — M79662 Pain in left lower leg: Secondary | ICD-10-CM | POA: Insufficient documentation

## 2015-05-18 DIAGNOSIS — Z21 Asymptomatic human immunodeficiency virus [HIV] infection status: Secondary | ICD-10-CM | POA: Insufficient documentation

## 2015-05-18 DIAGNOSIS — M79605 Pain in left leg: Secondary | ICD-10-CM

## 2015-05-18 DIAGNOSIS — M79604 Pain in right leg: Secondary | ICD-10-CM

## 2015-05-18 MED ORDER — HYDROCODONE-ACETAMINOPHEN 5-325 MG PO TABS: 1.0000 | ORAL_TABLET | ORAL | Status: DC | PRN

## 2015-05-18 MED ORDER — HYDROCODONE-ACETAMINOPHEN 5-325 MG PO TABS
2.0000 | ORAL_TABLET | Freq: Once | ORAL | Status: AC
  Administered 2015-05-18: 2 via ORAL
  Filled 2015-05-18: qty 2

## 2015-05-18 NOTE — Discharge Instructions (Signed)
1. Medications: vicodin, usual home medications 2. Treatment: rest, drink plenty of fluids, ice, elevate 3. Follow Up: please followup with your neurologist as scheduled on the 27th for discussion of your diagnoses and further evaluation after today's visit; if you do not have a primary care doctor use the resource guide provided to find one; please return to the ER for new or worsening symptoms

## 2015-05-18 NOTE — ED Notes (Signed)
Bed: [id number] Expected date: 05/18/15 Expected time:  Means of arrival:  Comments:

## 2015-05-18 NOTE — ED Provider Notes (Signed)
CSN: [id number]     Arrival date & time 05/18/15  1037 History   First MD Initiated Contact with Patient 05/18/15 1112     Chief Complaint  Patient presents with  . Peripheral Neuropathy    HPI   Cody Villarreal is a 29 y.o. male with a PMH of HIV who presents to the ED with bilateral lower extremity pain from his knees to feet, which he attributes to peripheral neuropathy. He states his current symptoms started on Sunday and have been constant since that time. He characterizes his pain as burning. He has tried his home medications for symptom relief, which have not been effective. He reports movement exacerbates his pain. He denies numbness, weakness, edema, erythema. He states he has been taking his antiretroviral therapy as prescribed. Per record review, appears his last viral load in August was undetectable.   Past Medical History  Diagnosis Date  . HIV disease (HCC)   . Patella-femoral syndrome    Past Surgical History  Procedure Laterality Date  . Back surgery     No family history on file. Social History  Substance Use Topics  . Smoking status: Current Every Day Smoker -- 0.50 packs/day    Types: Cigarettes  . Smokeless tobacco: Never Used  . Alcohol Use: No      Review of Systems  Musculoskeletal: Positive for myalgias.  Neurological: Negative for weakness and numbness.      Allergies  Review of patient's allergies indicates no known allergies.  Home Medications   Prior to Admission medications   Medication Sig Start Date End Date Taking? Authorizing Provider  acetaminophen (TYLENOL) 500 MG tablet Take 1 tablet (500 mg total) by mouth every 6 (six) hours as needed. 05/01/15   Mady Gemma, PA-C  elvitegravir-cobicistat-emtricitabine-tenofovir (STRIBILD) 150-150-200-300 MG TABS Take 1 tablet by mouth at bedtime.    Historical Provider, MD  gabapentin (NEURONTIN) 300 MG capsule Take 1 capsule (300 mg total) by mouth 3 (three) times daily. Take  at  night on day 1,  twice a day on day 2,  three times on day 3 and every day thereafter until you see your primary doctor 02/11/15   Antony Madura, PA-C  HYDROcodone-acetaminophen (NORCO/VICODIN) 5-325 MG tablet Take 1 tablet by mouth every 4 (four) hours as needed. 05/18/15   Mady Gemma, PA-C    BP 121/76 mmHg  Pulse 73  Temp(Src) 97.7 F (36.5 C) (Oral)  Resp 16  SpO2 100% Physical Exam  Constitutional: He is oriented to person, place, and time. He appears well-developed and well-nourished. No distress.  Patient appears uncomfortable due to pain.  HENT:  Head: Normocephalic and atraumatic.  Right Ear: External ear normal.  Left Ear: External ear normal.  Nose: Nose normal.  Eyes: Conjunctivae and EOM are normal. Right eye exhibits no discharge. Left eye exhibits no discharge. No scleral icterus.  Neck: Normal range of motion. Neck supple.  Cardiovascular: Normal rate, regular rhythm and intact distal pulses.   Pulmonary/Chest: Effort normal and breath sounds normal. No respiratory distress.  Musculoskeletal: Normal range of motion. He exhibits no edema or tenderness.  No TTP of lower extremities bilaterally. No edema or erythema. No palpable cords or varicosities. Full ROM of lower extremities bilaterally. Strength and sensation intact. Distal pulses intact.  Neurological: He is alert and oriented to person, place, and time. He has normal strength. No sensory deficit.  Skin: Skin is warm and dry. He is not diaphoretic.  Psychiatric: He has a normal mood  and affect. His behavior is normal.  Nursing note and vitals reviewed.   ED Course  Procedures (including critical care time)  Labs Review Labs Reviewed - No data to display  Imaging Review No results found.     EKG Interpretation None      MDM   Final diagnoses:  Lower extremity pain, bilateral    [age] year old male presents with lower extremity pain from his knees to feet bilaterally, which he states  is consistent with his history of peripheral neuropathy. Reports he has tried duloxetine, gabapentin, tramadol, and tylenol for his symptoms with no significant improvement, and that his dose of duloxetine was recently increased by his neurologist. Patient is afebrile. Vitals signs stable. Patient is tearful on my evaluation. No TTP of lower extremities bilaterally. No edema or erythema. No palpable cords or varicosities. Full ROM of lower extremities bilaterally. Strength and sensation intact. Distal pulses intact.   Symptoms likely due to patient's history of peripheral neuropathy, as he states his pain is consistent with the flares he experiences. Has tried multiple medications without significant relief. Will give short course of pain medication for symptoms. Patient to follow-up with neurology as scheduled on December 27 for further evaluation and management. Return precautions discussed at length. Patient verbalizes his understanding and is in agreement with plan.  BP 121/76 mmHg  Pulse 73  Temp(Src) 97.7 F (36.5 C) (Oral)  Resp 16  SpO2 100%     Mady Gemmalizabeth C Verdine Grenfell, PA-C 05/18/15 1333  Margarita Grizzleanielle Ray, MD 05/20/15 1527

## 2015-05-18 NOTE — ED Notes (Signed)
Per pt, states neuropathy in both feet since beginning of month-MD in [place]-states he needs pain meds-states OTC meds not working

## 2015-06-13 ENCOUNTER — Emergency Department (HOSPITAL_COMMUNITY): Payer: BLUE CROSS/BLUE SHIELD

## 2015-06-13 ENCOUNTER — Emergency Department (HOSPITAL_COMMUNITY)
Admission: EM | Admit: 2015-06-13 | Discharge: 2015-06-13 | Disposition: A | Discharge status: Home / Self Care | Payer: BLUE CROSS/BLUE SHIELD | Attending: Emergency Medicine | Admitting: Emergency Medicine

## 2015-06-13 ENCOUNTER — Encounter (HOSPITAL_COMMUNITY): Payer: Self-pay

## 2015-06-13 DIAGNOSIS — Z21 Asymptomatic human immunodeficiency virus [HIV] infection status: Secondary | ICD-10-CM | POA: Diagnosis not present

## 2015-06-13 DIAGNOSIS — J40 Bronchitis, not specified as acute or chronic: Secondary | ICD-10-CM | POA: Insufficient documentation

## 2015-06-13 DIAGNOSIS — J069 Acute upper respiratory infection, unspecified: Secondary | ICD-10-CM

## 2015-06-13 DIAGNOSIS — R079 Chest pain, unspecified: Secondary | ICD-10-CM | POA: Insufficient documentation

## 2015-06-13 DIAGNOSIS — Z79899 Other long term (current) drug therapy: Secondary | ICD-10-CM | POA: Insufficient documentation

## 2015-06-13 DIAGNOSIS — R1110 Vomiting, unspecified: Secondary | ICD-10-CM | POA: Diagnosis not present

## 2015-06-13 DIAGNOSIS — Z8739 Personal history of other diseases of the musculoskeletal system and connective tissue: Secondary | ICD-10-CM | POA: Diagnosis not present

## 2015-06-13 DIAGNOSIS — R05 Cough: Secondary | ICD-10-CM | POA: Diagnosis present

## 2015-06-13 DIAGNOSIS — R197 Diarrhea, unspecified: Secondary | ICD-10-CM | POA: Insufficient documentation

## 2015-06-13 MED ORDER — NAPROXEN 500 MG PO TABS: 500.0000 mg | ORAL_TABLET | Freq: Two times a day (BID) | ORAL | Status: DC

## 2015-06-13 MED ORDER — DM-GUAIFENESIN ER 30-600 MG PO TB12: 1.0000 | ORAL_TABLET | Freq: Two times a day (BID) | ORAL | Status: DC

## 2015-06-13 MED ORDER — DM-GUAIFENESIN ER 30-600 MG PO TB12
1.0000 | ORAL_TABLET | Freq: Two times a day (BID) | ORAL | Status: DC
  Administered 2015-06-13: 1 via ORAL
  Filled 2015-06-13: qty 1

## 2015-06-13 NOTE — ED Provider Notes (Signed)
CSN: [id number]     Arrival date & time 06/13/15  1147 History   First MD Initiated Contact with Patient 06/13/15 1212     Chief Complaint  Patient presents with  . URI  . Emesis     (Consider location/radiation/quality/duration/timing/severity/associated sxs/prior Treatment) Patient is a 30 y.o. male presenting with URI and vomiting. The history is provided by the patient.  URI Presenting symptoms: congestion and cough   Presenting symptoms: no fever   Associated symptoms: no headaches   Emesis Associated symptoms: diarrhea and URI   Associated symptoms: no abdominal pain and no headaches    patient with four-day history of upper respiratory infections with congestion. Patient has a child currently in the hospital for upper respiratory infection felt that the bronchiolitis. Associated with this is some episodes of diarrhea no true vomiting chest discomfort from the cough. No fevers. Cough is productive.  Past Medical History  Diagnosis Date  . HIV disease (HCC)   . Patella-femoral syndrome    Past Surgical History  Procedure Laterality Date  . Back surgery     No family history on file. Social History  Substance Use Topics  . Smoking status: Current Every Day Smoker -- 0.50 packs/day    Types: Cigarettes  . Smokeless tobacco: Never Used  . Alcohol Use: No    Review of Systems  Constitutional: Negative for fever.  HENT: Positive for congestion.   Eyes: Negative for visual disturbance.  Respiratory: Positive for cough.   Cardiovascular: Positive for chest pain.  Gastrointestinal: Positive for diarrhea. Negative for nausea, vomiting and abdominal pain.  Genitourinary: Negative for dysuria.  Musculoskeletal: Negative for back pain.  Skin: Negative for rash.  Neurological: Negative for headaches.  Hematological: Does not bruise/bleed easily.  Psychiatric/Behavioral: Negative for confusion.      Allergies  Review of patient's allergies indicates no known  allergies.  Home Medications   Prior to Admission medications   Medication Sig Start Date End Date Taking? Authorizing Provider  diphenhydrAMINE (SOMINEX) 25 MG tablet Take 50 mg by mouth daily as needed for allergies or sleep.   Yes Historical Provider, MD  DULoxetine (CYMBALTA) 30 MG capsule Take 30-60 mg by mouth 2 (two) times daily. Takes two capsules in the morning and one capsule at night. 05/31/15  Yes Historical Provider, MD  elvitegravir-cobicistat-emtricitabine-tenofovir (STRIBILD) 150-150-200-300 MG TABS Take 1 tablet by mouth at bedtime.   Yes Historical Provider, MD  gabapentin (NEURONTIN) 300 MG capsule Take 1 capsule (300 mg total) by mouth 3 (three) times daily. Take 300mg  at night on day 1, 300mg  twice a day on day 2, 300mg  three times on day 3 and every day thereafter until you see your primary doctor Patient taking differently: Take 900 mg by mouth 3 (three) times daily.  02/11/15  Yes Antony MaduraKelly Humes, PA-C  dextromethorphan-guaiFENesin (MUCINEX DM) 30-600 MG 12hr tablet Take 1 tablet by mouth 2 (two) times daily. 06/13/15   Vanetta MuldersScott Anneke Cundy, MD  naproxen (NAPROSYN) 500 MG tablet Take 1 tablet (500 mg total) by mouth 2 (two) times daily. 06/13/15   Vanetta MuldersScott Chanda Laperle, MD   BP 112/69 mmHg  Pulse 83  Temp(Src) 98.7 F (37.1 C) (Oral)  Resp 24  SpO2 100% Physical Exam  Constitutional: He is oriented to person, place, and time. He appears well-developed and well-nourished. No distress.  HENT:  Head: Normocephalic and atraumatic.  Mouth/Throat: Oropharynx is clear and moist.  Eyes: Conjunctivae and EOM are normal. Pupils are equal, round, and reactive to light.  Neck: Normal range of motion. Neck supple.  Pulmonary/Chest: Effort normal and breath sounds normal. No respiratory distress. He has no wheezes.  Abdominal: Soft. Bowel sounds are normal. There is no tenderness.  Musculoskeletal: Normal range of motion. He exhibits no edema.  Neurological: He is alert and oriented to person,  place, and time. No cranial nerve deficit. He exhibits normal muscle tone. Coordination normal.  Skin: Skin is warm. No rash noted.  Nursing note and vitals reviewed.   ED Course  Procedures (including critical care time) Labs Review Labs Reviewed - No data to display  Imaging Review Dg Chest 2 View  06/13/2015  CLINICAL DATA:  Productive cough x 4 days accompanied with central chest pain when coughing and SOB. H/o HIV. Smoker x approximately 11 years. EXAM: CHEST - 2 VIEW COMPARISON:  05/26/2014 FINDINGS: Lungs are clear. Heart size and mediastinal contours are within normal limits. No effusion. Visualized skeletal structures are unremarkable. IMPRESSION: No acute cardiopulmonary disease. Electronically Signed   By: Corlis Leak M.D.   On: 06/13/2015 14:36   I have personally reviewed and evaluated these images and lab results as part of my medical decision-making.   EKG Interpretation None      MDM   Final diagnoses:  URI (upper respiratory infection)  Bronchitis    Chest x-ray negative for pneumonia. 4 days worth of upper respiratory type symptoms with productive cough. Will treat symptomatically. Prescription for Mucinex DM and Naprosyn. Work note provided. Patient return for any new or worse symptoms. Patient nontoxic no acute distress. Room air sats are 99%. No fevers.    Vanetta Mulders, MD 06/13/15 [phone]

## 2015-06-13 NOTE — Discharge Instructions (Signed)
Chest x-ray negative for pneumonia. Take the Mucinex DM to help with the cough and loosen the phlegm. Take the Naprosyn not for that chest discomfort and bodyaches. Work note provided. Return for any new or worse symptoms.

## 2015-06-13 NOTE — ED Notes (Signed)
Pt ambulating independently w/ steady gait on d/c in no acute distress, A&Ox4. D/c instructions reviewed w/ pt - pt denies any further questions or concerns at present. Rx given x2  

## 2015-06-13 NOTE — ED Notes (Signed)
He c/o cough/aches, plus occasional emesis x 2 days.  He is in no distress.  He states he has chest discomfort (with cough only).

## 2015-06-20 ENCOUNTER — Emergency Department (HOSPITAL_COMMUNITY)
Admission: EM | Admit: 2015-06-20 | Discharge: 2015-06-20 | Disposition: A | Discharge status: Home / Self Care | Payer: BLUE CROSS/BLUE SHIELD | Attending: Emergency Medicine | Admitting: Emergency Medicine

## 2015-06-20 ENCOUNTER — Encounter (HOSPITAL_COMMUNITY): Payer: Self-pay | Admitting: Emergency Medicine

## 2015-06-20 DIAGNOSIS — F17210 Nicotine dependence, cigarettes, uncomplicated: Secondary | ICD-10-CM | POA: Diagnosis not present

## 2015-06-20 DIAGNOSIS — Z8739 Personal history of other diseases of the musculoskeletal system and connective tissue: Secondary | ICD-10-CM | POA: Diagnosis not present

## 2015-06-20 DIAGNOSIS — J029 Acute pharyngitis, unspecified: Secondary | ICD-10-CM | POA: Diagnosis present

## 2015-06-20 DIAGNOSIS — Z79899 Other long term (current) drug therapy: Secondary | ICD-10-CM | POA: Diagnosis not present

## 2015-06-20 DIAGNOSIS — B20 Human immunodeficiency virus [HIV] disease: Secondary | ICD-10-CM | POA: Insufficient documentation

## 2015-06-20 LAB — CBC WITH DIFFERENTIAL/PLATELET
Basophils Absolute: 0 10*3/uL (ref 0.0–0.1)
Basophils Relative: 0 %
EOS ABS: 0.2 10*3/uL (ref 0.0–0.7)
Eosinophils Relative: 3 %
HEMATOCRIT: 35.5 % — AB (ref 39.0–52.0)
Hemoglobin: 11.9 g/dL — ABNORMAL LOW (ref 13.0–17.0)
LYMPHS ABS: 2.4 10*3/uL (ref 0.7–4.0)
LYMPHS PCT: 26 %
MCH: 27.5 pg (ref 26.0–34.0)
MCHC: 33.5 g/dL (ref 30.0–36.0)
MCV: 82 fL (ref 78.0–100.0)
Monocytes Absolute: 1.1 10*3/uL — ABNORMAL HIGH (ref 0.1–1.0)
Monocytes Relative: 12 %
NEUTROS ABS: 5.4 10*3/uL (ref 1.7–7.7)
NEUTROS PCT: 59 %
Platelets: 314 10*3/uL (ref 150–400)
RBC: 4.33 MIL/uL (ref 4.22–5.81)
RDW: 13.3 % (ref 11.5–15.5)
WBC: 9.2 10*3/uL (ref 4.0–10.5)

## 2015-06-20 LAB — RAPID STREP SCREEN (MED CTR MEBANE ONLY): Streptococcus, Group A Screen (Direct): NEGATIVE

## 2015-06-20 MED ORDER — OXYCODONE-ACETAMINOPHEN 5-325 MG PO TABS
2.0000 | ORAL_TABLET | ORAL | Status: DC | PRN
Start: 2015-06-20 — End: 2020-08-21

## 2015-06-20 NOTE — ED Notes (Signed)
Pt diagnosed with a URI last Saturday. Pt reports while some symptoms, like his cough, have gotten better he is having emesis with coughing episodes. Pt unable to keep fluids down. Also reports L side throat pain and night sweats. Pt HIV positive.

## 2015-06-20 NOTE — Discharge Instructions (Signed)

## 2015-06-20 NOTE — ED Provider Notes (Signed)
CSN: [id number]     Arrival date & time 06/20/15  1225 History   First MD Initiated Contact with Patient 06/20/15 1242     Chief Complaint  Patient presents with  . Emesis  . Sore Throat     (Consider location/radiation/quality/duration/timing/severity/associated sxs/prior Treatment) HPI 30 year old male HIV positive has had URI symptoms for the past week. 2 days ago he began having some sore throat more on the left than on the right. He has not noted any swelling, difficulty speaking, or breathing. He reports that he has not had any fever or known exposures. He is taking his HIV medications as prescribed. Past Medical History  Diagnosis Date  . HIV disease (HCC)   . Patella-femoral syndrome    Past Surgical History  Procedure Laterality Date  . Back surgery     History reviewed. No pertinent family history. Social History  Substance Use Topics  . Smoking status: Current Every Day Smoker -- 0.50 packs/day    Types: Cigarettes  . Smokeless tobacco: Never Used  . Alcohol Use: No    Review of Systems  All other systems reviewed and are negative.     Allergies  Review of patient's allergies indicates no known allergies.  Home Medications   Prior to Admission medications   Medication Sig Start Date End Date Taking? Authorizing Provider  dextromethorphan-guaiFENesin (MUCINEX DM) 30-600 MG 12hr tablet Take 1 tablet by mouth 2 (two) times daily. 06/13/15  Yes Vanetta Mulders, MD  DULoxetine (CYMBALTA) 30 MG capsule Take 30-60 mg by mouth 2 (two) times daily. Takes two capsules in the morning and one capsule at night. 05/31/15  Yes Historical Provider, MD  elvitegravir-cobicistat-emtricitabine-tenofovir (STRIBILD) 150-150-200-300 MG TABS Take 1 tablet by mouth at bedtime.   Yes Historical Provider, MD  gabapentin (NEURONTIN) 300 MG capsule Take 1 capsule (300 mg total) by mouth 3 (three) times daily. Take  at night on day 1,  twice a day on day 2,  three times on day  3 and every day thereafter until you see your primary doctor Patient taking differently: Take 900 mg by mouth 3 (three) times daily.  02/11/15  Yes Antony Madura, PA-C  naproxen (NAPROSYN) 500 MG tablet Take 1 tablet (500 mg total) by mouth 2 (two) times daily. Patient not taking: Reported on 06/20/2015 06/13/15   Vanetta Mulders, MD   BP 107/66 mmHg  Pulse 88  Temp(Src) 98.7 F (37.1 C) (Oral)  Resp 20  SpO2 100% Physical Exam  Constitutional: He is oriented to person, place, and time. He appears well-developed and well-nourished.  HENT:  Head: Normocephalic and atraumatic.  Right Ear: External ear normal.  Left Ear: External ear normal.  Nose: Nose normal.  Mouth/Throat: Oropharynx is clear and moist.  Eyes: Conjunctivae and EOM are normal. Pupils are equal, round, and reactive to light.  Neck: Normal range of motion. Neck supple.  Cardiovascular: Normal rate, regular rhythm, normal heart sounds and intact distal pulses.   Pulmonary/Chest: Effort normal and breath sounds normal.  Abdominal: Soft. Bowel sounds are normal.  Musculoskeletal: Normal range of motion.  Neurological: He is alert and oriented to person, place, and time. He has normal reflexes.  Skin: Skin is warm and dry.  Psychiatric: He has a normal mood and affect. His behavior is normal. Judgment and thought content normal.  Nursing note and vitals reviewed.   ED Course  Procedures (including critical care time) Labs Review Labs Reviewed  CBC WITH DIFFERENTIAL/PLATELET - Abnormal; Notable for the following:  Hemoglobin 11.9 (*)    HCT 35.5 (*)    Monocytes Absolute 1.1 (*)    All other components within normal limits  RAPID STREP SCREEN (NOT AT [facility])  CULTURE, GROUP A STREP ([facility])  CULTURE, GROUP A STREP [facility])    Imaging Review No results found. I have personally reviewed and evaluated these images and lab results as part of my medical decision-making.   EKG Interpretation None      MDM   Final  diagnoses:  Sore throat  HIV (human immunodeficiency virus infection) (HCC)    This is a 30 year old male with HIV who presents today with sore throat for the past 2 days after a week of URI symptoms. Strep screen here is negative. Patient is hemodynamically stable. CBC is checked and white count is 9200 with normal differential. Patient to be discharged home with conservative treatments for sore throat. He is advised to return precautions voices standard    Margarita Grizzle, MD 06/24/15 1258

## 2015-06-23 LAB — CULTURE, GROUP A STREP (THRC)

## 2015-07-08 ENCOUNTER — Emergency Department (HOSPITAL_COMMUNITY)
Admission: EM | Admit: 2015-07-08 | Discharge: 2015-07-08 | Disposition: A | Discharge status: Home / Self Care | Payer: BLUE CROSS/BLUE SHIELD | Attending: Emergency Medicine | Admitting: Emergency Medicine

## 2015-07-08 ENCOUNTER — Encounter (HOSPITAL_COMMUNITY): Payer: Self-pay

## 2015-07-08 DIAGNOSIS — Z79899 Other long term (current) drug therapy: Secondary | ICD-10-CM | POA: Diagnosis not present

## 2015-07-08 DIAGNOSIS — M79662 Pain in left lower leg: Secondary | ICD-10-CM | POA: Diagnosis not present

## 2015-07-08 DIAGNOSIS — M79606 Pain in leg, unspecified: Secondary | ICD-10-CM

## 2015-07-08 DIAGNOSIS — G8929 Other chronic pain: Secondary | ICD-10-CM | POA: Diagnosis not present

## 2015-07-08 DIAGNOSIS — G9009 Other idiopathic peripheral autonomic neuropathy: Secondary | ICD-10-CM | POA: Insufficient documentation

## 2015-07-08 DIAGNOSIS — D849 Immunodeficiency, unspecified: Secondary | ICD-10-CM | POA: Insufficient documentation

## 2015-07-08 DIAGNOSIS — F17210 Nicotine dependence, cigarettes, uncomplicated: Secondary | ICD-10-CM | POA: Insufficient documentation

## 2015-07-08 DIAGNOSIS — Z21 Asymptomatic human immunodeficiency virus [HIV] infection status: Secondary | ICD-10-CM | POA: Insufficient documentation

## 2015-07-08 DIAGNOSIS — M79661 Pain in right lower leg: Secondary | ICD-10-CM | POA: Diagnosis not present

## 2015-07-08 DIAGNOSIS — G609 Hereditary and idiopathic neuropathy, unspecified: Secondary | ICD-10-CM

## 2015-07-08 HISTORY — DX: Polyneuropathy, unspecified: G62.9

## 2015-07-08 MED ORDER — HYDROCODONE-ACETAMINOPHEN 5-325 MG PO TABS
1.0000 | ORAL_TABLET | Freq: Once | ORAL | Status: AC
  Administered 2015-07-08: 1 via ORAL
  Filled 2015-07-08: qty 1

## 2015-07-08 MED ORDER — HYDROCODONE-ACETAMINOPHEN 5-325 MG PO TABS: 1.0000 | ORAL_TABLET | Freq: Four times a day (QID) | ORAL | Status: DC | PRN

## 2015-07-08 NOTE — Discharge Instructions (Signed)
Use tylenol or motrin as needed for pain, using norco for breakthrough pain as directed as needed but don't drive while taking norco. Call your regular doctor and your neurologist to try to move up your appointments to see them sooner for ongoing management of your chronic leg pain/neuropathy pain. Return to the ER for changes or worsening symptoms.   Neuropathic Pain Neuropathic pain is pain caused by damage to the nerves that are responsible for certain sensations in your body (sensory nerves). The pain can be caused by damage to:   The sensory nerves that send signals to your spinal cord and brain (peripheral nervous system).  The sensory nerves in your brain or spinal cord (central nervous system). Neuropathic pain can make you more sensitive to pain. What would be a minor sensation for most people may feel very painful if you have neuropathic pain. This is usually a long-term condition that can be difficult to treat. The type of pain can differ from person to person. It may start suddenly (acute), or it may develop slowly and last for a long time (chronic). Neuropathic pain may come and go as damaged nerves heal or may stay at the same level for years. It often causes emotional distress, loss of sleep, and a lower quality of life. CAUSES  The most common cause of damage to a sensory nerve is diabetes. Many other diseases and conditions can also cause neuropathic pain. Causes of neuropathic pain can be classified as:  Toxic. Many drugs and chemicals can cause toxic damage. The most common cause of toxic neuropathic pain is damage from drug treatment for cancer (chemotherapy).  Metabolic. This type of pain can happen when a disease causes imbalances that damage nerves. Diabetes is the most common of these diseases. Vitamin B deficiency caused by long-term alcohol abuse is another common cause.  Traumatic. Any injury that cuts, crushes, or stretches a nerve can cause damage and pain. A common  example is feeling pain after losing an arm or leg (phantom limb pain).  Compression-related. If a sensory nerve gets trapped or compressed for a long period of time, the blood supply to the nerve can be cut off.  Vascular. Many blood vessel diseases can cause neuropathic pain by decreasing blood supply and oxygen to nerves.  Autoimmune. This type of pain results from diseases in which the body's defense system mistakenly attacks sensory nerves. Examples of autoimmune diseases that can cause neuropathic pain include lupus and multiple sclerosis.  Infectious. Many types of viral infections can damage sensory nerves and cause pain. Shingles infection is a common cause of this type of pain.  Inherited. Neuropathic pain can be a symptom of many diseases that are passed down through families (genetic). SIGNS AND SYMPTOMS  The main symptom is pain. Neuropathic pain is often described as:  Burning.  Shock-like.  Stinging.  Hot or cold.  Itching. DIAGNOSIS  No single test can diagnose neuropathic pain. Your health care provider will do a physical exam and ask you about your pain. You may use a pain scale to describe how bad your pain is. You may also have tests to see if you have a high sensitivity to pain and to help find the cause and location of any sensory nerve damage. These tests may include:  Imaging studies, such as:  X-rays.  CT scan.  MRI.  Nerve conduction studies to test how well nerve signals travel through your sensory nerves (electrodiagnostic testing).  Stimulating your sensory nerves through electrodes on  your skin and measuring the response in your spinal cord and brain (somatosensory evoked potentials). TREATMENT  Treatment for neuropathic pain may change over time. You may need to try different treatment options or a combination of treatments. Some options include:  Over-the-counter pain relievers.  Prescription medicines. Some medicines used to treat other  conditions may also help neuropathic pain. These include medicines to:  Control seizures (anticonvulsants).  Relieve depression (antidepressants).  Prescription-strength pain relievers (narcotics). These are usually used when other pain relievers do not help.  Transcutaneous nerve stimulation (TENS). This uses electrical currents to block painful nerve signals. The treatment is painless.  Topical and local anesthetics. These are medicines that numb the nerves. They can be injected as a nerve block or applied to the skin.  Alternative treatments, such as:  Acupuncture.  Meditation.  Massage.  Physical therapy.  Pain management programs.  Counseling. HOME CARE INSTRUCTIONS  Learn as much as you can about your condition.  Take medicines only as directed by your health care provider.  Work closely with all your health care providers to find what works best for you.  Have a good support system at home.  Consider joining a chronic pain support group. SEEK MEDICAL CARE IF:  Your pain treatments are not helping.  You are having side effects from your medicines.  You are struggling with fatigue, mood changes, depression, or anxiety.   This information is not intended to replace advice given to you by your health care provider. Make sure you discuss any questions you have with your health care provider.   Document Released: 02/11/2004 Document Revised: 06/06/2014 Document Reviewed: 10/24/2013 Elsevier Interactive Patient Education 2016 [employer]  Peripheral Neuropathy Peripheral neuropathy is a type of nerve damage. It affects nerves that carry signals between the spinal cord and other parts of the body. These are called peripheral nerves. With peripheral neuropathy, one nerve or a group of nerves may be damaged.  CAUSES  Many things can damage peripheral nerves. For some people with peripheral neuropathy, the cause is unknown. Some causes include:  Diabetes. This is  the most common cause of peripheral neuropathy.  Injury to a nerve.  Pressure or stress on a nerve that lasts a long time.  Too little vitamin B. Alcoholism can lead to this.  Infections.  Autoimmune diseases, such as multiple sclerosis and systemic lupus erythematosus.  Inherited nerve diseases.  Some medicines, such as cancer drugs.  Toxic substances, such as lead and mercury.  Too little blood flowing to the legs.  Kidney disease.  Thyroid disease. SIGNS AND SYMPTOMS  Different people have different symptoms. The symptoms you have will depend on which of your nerves is damaged. Common symptoms include:  Loss of feeling (numbness) in the feet and hands.  Tingling in the feet and hands.  Pain that burns.  Very sensitive skin.  Weakness.  Not being able to move a part of the body (paralysis).  Muscle twitching.  Clumsiness or poor coordination.  Loss of balance.  Not being able to control your bladder.  Feeling dizzy.  Sexual problems. DIAGNOSIS  Peripheral neuropathy is a symptom, not a disease. Finding the cause of peripheral neuropathy can be hard. To figure that out, your health care provider will take a medical history and do a physical exam. A neurological exam will also be done. This involves checking things affected by your brain, spinal cord, and nerves (nervous system). For example, your health care provider will check your reflexes, how you  move, and what you can feel.  Other types of tests may also be ordered, such as:  Blood tests.  A test of the fluid in your spinal cord.  Imaging tests, such as CT scans or an MRI.  Electromyography (EMG). This test checks the nerves that control muscles.  Nerve conduction velocity tests. These tests check how fast messages pass through your nerves.  Nerve biopsy. A small piece of nerve is removed. It is then checked under a microscope. TREATMENT   Medicine is often used to treat peripheral neuropathy.  Medicines may include:  Pain-relieving medicines. Prescription or over-the-counter medicine may be suggested.  Antiseizure medicine. This may be used for pain.  Antidepressants. These also may help ease pain from neuropathy.  Lidocaine. This is a numbing medicine. You might wear a patch or be given a shot.  Mexiletine. This medicine is typically used to help control irregular heart rhythms.  Surgery. Surgery may be needed to relieve pressure on a nerve or to destroy a nerve that is causing pain.  Physical therapy to help movement.  Assistive devices to help movement. HOME CARE INSTRUCTIONS   Only take over-the-counter or prescription medicines as directed by your health care provider. Follow the instructions carefully for any given medicines. Do not take any other medicines without first getting approval from your health care provider.  If you have diabetes, work closely with your health care provider to keep your blood sugar under control.  If you have numbness in your feet:  Check every day for signs of injury or infection. Watch for redness, warmth, and swelling.  Wear padded socks and comfortable shoes. These help protect your feet.  Do not do things that put pressure on your damaged nerve.  Do not smoke. Smoking keeps blood from getting to damaged nerves.  Avoid or limit alcohol. Too much alcohol can cause a lack of B vitamins. These vitamins are needed for healthy nerves.  Develop a good support system. Coping with peripheral neuropathy can be stressful. Talk to a mental health specialist or join a support group if you are struggling.  Follow up with your health care provider as directed. SEEK MEDICAL CARE IF:   You have new signs or symptoms of peripheral neuropathy.  You are struggling emotionally from dealing with peripheral neuropathy.  You have a fever. SEEK IMMEDIATE MEDICAL CARE IF:   You have an injury or infection that is not healing.  You feel very  dizzy or begin vomiting.  You have chest pain.  You have trouble breathing.   This information is not intended to replace advice given to you by your health care provider. Make sure you discuss any questions you have with your health care provider.   Document Released: 05/06/2002 Document Revised: 01/26/2011 Document Reviewed: 01/21/2013 Elsevier Interactive Patient Education [employer]

## 2015-07-08 NOTE — ED Notes (Signed)
paatient states he sees a specilaist in [facility] for neuropathy, but meds that he has been placed on is not helping. Patient c/o bilateral leg and foot pain.

## 2015-07-08 NOTE — ED Provider Notes (Signed)
CSN: [id number]     Arrival date & time 07/08/15  1340 History   First MD Initiated Contact with Patient 07/08/15 1353     Chief Complaint  Patient presents with  . Leg Pain  . Foot Pain     (Consider location/radiation/quality/duration/timing/severity/associated sxs/prior Treatment) HPI Comments: Cody Villarreal is a 30 y.o. male with a PMHx of HIV, patella-femoral syndrome, and neuropathy, who presents to the ED with complaints of chronic bilateral lower leg pain. Patient states this is chronic and unchanged, no acute issue, simply states that the medications his neurologist has given him have not been helping. He describes his pain currently is 9/10 constant bilateral lower leg aching and burning pain, which is nonradiating, worse with walking and prolonged activity, and unrelieved with gabapentin  3 times a day, Cymbalta, and Tylenol. He states that he tried calling his neurologist and doesn't have an appointment until April, which is why he came here to see if there is anything else that could be done. In the past he has been given Vicodin and Percocet which have helped. He denies any new injuries or changes in his pain.  He denies any fevers, chills, leg swelling, skin changes including erythema or bruising, skin warmth, chest pain, shortness of breath, abdominal pain, nausea, vomiting, diarrhea, constipation, dysuria, hematuria, neck or back pain, incontinence of urine or stool, saddle anesthesia or cauda equina symptoms, and numbness, tingling, or focal weakness.  Last CD4 count 889 on 01/15/15, he follows up with Dr. Waylan Rocher for his HIV disease. He is compliant with his HIV medications. He had a normal EMG/NCS study on 04/2015 at [facility]. His doctor there is Dr. Thedore Mins.  Patient is a 30 y.o. male presenting with leg pain and lower extremity pain. The history is provided by the patient and medical records. No language interpreter was used.  Leg Pain Location:   Leg Injury: no   Leg location:  R lower leg and L lower leg Pain details:    Quality:  Burning   Radiates to:  Does not radiate   Severity:  Moderate   Onset quality:  Gradual   Duration: chronic.   Timing:  Constant   Progression:  Unchanged Chronicity:  Chronic Prior injury to area:  No Relieved by:  Nothing Worsened by:  Activity Ineffective treatments:  Acetaminophen (and cymbalta and gabapentin) Associated symptoms: no back pain, no decreased ROM, no fever, no muscle weakness, no neck pain, no numbness, no swelling and no tingling   Foot Pain Associated symptoms include myalgias (b/l lower extremities). Pertinent negatives include no abdominal pain, arthralgias, chest pain, chills, fever, nausea, neck pain, numbness, vomiting or weakness.    Past Medical History  Diagnosis Date  . HIV disease (HCC)   . Patella-femoral syndrome   . Neuropathy [facility])    Past Surgical History  Procedure Laterality Date  . Back surgery     History reviewed. No pertinent family history. Social History  Substance Use Topics  . Smoking status: Current Every Day Smoker -- 0.50 packs/day    Types: Cigarettes  . Smokeless tobacco: Never Used  . Alcohol Use: No    Review of Systems  Constitutional: Negative for fever and chills.  Respiratory: Negative for shortness of breath.   Cardiovascular: Negative for chest pain and leg swelling.  Gastrointestinal: Negative for nausea, vomiting, abdominal pain, diarrhea and constipation.  Genitourinary: Negative for dysuria, hematuria and difficulty urinating (no incontinence).  Musculoskeletal: Positive for myalgias (b/l lower extremities). Negative for  back pain, arthralgias and neck pain.  Skin: Negative for color change and wound.  Allergic/Immunologic: Positive for immunocompromised state (HIV).  Neurological: Negative for weakness and numbness.  Psychiatric/Behavioral: Negative for confusion.   10 Systems reviewed and are negative for acute  change except as noted in the HPI.    Allergies  Review of patient's allergies indicates no known allergies.  Home Medications   Prior to Admission medications   Medication Sig Start Date End Date Taking? Authorizing Provider  dextromethorphan-guaiFENesin (MUCINEX DM) 30-600 MG 12hr tablet Take 1 tablet by mouth 2 (two) times daily. 06/13/15   Vanetta Mulders, MD  DULoxetine (CYMBALTA) 30 MG capsule Take 30-60 mg by mouth 2 (two) times daily. Takes two capsules in the morning and one capsule at night. 05/31/15   Historical Provider, MD  elvitegravir-cobicistat-emtricitabine-tenofovir (STRIBILD) 150-150-200-300 MG TABS Take 1 tablet by mouth at bedtime.    Historical Provider, MD  gabapentin (NEURONTIN) 300 MG capsule Take 1 capsule (300 mg total) by mouth 3 (three) times daily. Take  at night on day 1,  twice a day on day 2,  three times on day 3 and every day thereafter until you see your primary doctor Patient taking differently: Take 900 mg by mouth 3 (three) times daily.  02/11/15   Antony Madura, PA-C  naproxen (NAPROSYN) 500 MG tablet Take 1 tablet (500 mg total) by mouth 2 (two) times daily. Patient not taking: Reported on 06/20/2015 06/13/15   Vanetta Mulders, MD  oxyCODONE-acetaminophen (PERCOCET/ROXICET) 5-325 MG tablet Take 2 tablets by mouth every 4 (four) hours as needed for severe pain. 06/20/15   Margarita Grizzle, MD   BP 128/79 mmHg  Pulse 101  Temp(Src) 98 F (36.7 C) (Oral)  Resp 16  Ht  (1.753 m)  Wt 54.432 kg  BMI 17.71 kg/m2  SpO2 100% Physical Exam  Constitutional: He is oriented to person, place, and time. Vital signs are normal. He appears well-developed and well-nourished.  Non-toxic appearance. No distress.  Afebrile, nontoxic, NAD  HENT:  Head: Normocephalic and atraumatic.  Mouth/Throat: Mucous membranes are normal.  Eyes: Conjunctivae and EOM are normal. Right eye exhibits no discharge. Left eye exhibits no discharge.  Neck: Normal range of  motion. Neck supple.  Cardiovascular: Normal rate and intact distal pulses.   Pulmonary/Chest: Effort normal. No respiratory distress.  Abdominal: Normal appearance. He exhibits no distension.  Musculoskeletal: Normal range of motion.  B/l lower legs without focal TTP, no crepitus or deformities, with FROM intact at ankles and knees and in all digits, no skin changes or swelling, with strength and sensation grossly intact, distal pulses intact, and soft compartments.   Neurological: He is alert and oriented to person, place, and time. He has normal strength. No sensory deficit.  Skin: Skin is warm, dry and intact. No rash noted.  Psychiatric: He has a normal mood and affect.  Nursing note and vitals reviewed.   ED Course  Procedures (including critical care time) Labs Review Labs Reviewed - No data to display  Imaging Review No results found.   EMG/NCS 05/26/15: CONCLUSION  Normal study. There are no electrodiagnostic findings of a polyneuropathy.  There is no electrodiagnostic evidence to suggest a cause for the patient's  symptoms.   Clinical correlation is advised.    JFH:mh   transcribed: 05/27/15  I have personally reviewed and evaluated these images and lab results as part of my medical decision-making.   EKG Interpretation None      MDM  Final diagnoses:  Chronic leg pain, unspecified laterality  Peripheral neuropathy, idiopathic    30 y.o. male here with chronic b/l lower leg pain. On exam, extremities NVI with soft compartments, no swelling or discoloration, no focal tenderness. Has been seen by neurologist in [facility], had EMG/NCS last month that was negative. Is on gabapentin 300mg  TID and cymbalta without improvement. Discussed that neuropathic pain is difficult to control, and there are multiple other options that he could try through his neurologist. Encouraged him to f/up with neuro, could potentially benefit from lyrica. Discussed that  narcotics likely won't help but he states it's helped in the past. I suspect that there could be some drug-seeking involved but I feel that it's reasonable to give him one dose here and a few tabs to go home since NCDB reveals his last narcotic rx was the one given here after an ER visit for similar complaints, and only 3 rx's for narcotics in the last 6 months all of which were after his ER visits here. Very few tabs are dispensed each time. Discussed that if he wants chronic pain management, he needs to f/up with his PCP for this, or find a pain management clinic. F/up with PCP and neurologist in 1wk for ongoing management of chronic pain. I explained the diagnosis and have given explicit precautions to return to the ER including for any other new or worsening symptoms. The patient understands and accepts the medical plan as it's been dictated and I have answered their questions. Discharge instructions concerning home care and prescriptions have been given. The patient is STABLE and is discharged to home in good condition.    BP 128/79 mmHg  Pulse 101  Temp(Src) 98 F (36.7 C) (Oral)  Resp 16  Ht 5\' 9"  (1.753 m)  Wt 54.432 kg  BMI 17.71 kg/m2  SpO2 100%  Meds ordered this encounter  Medications  . HYDROcodone-acetaminophen (NORCO/VICODIN) 5-325 MG per tablet 1 tablet    Sig:   . HYDROcodone-acetaminophen (NORCO) 5-325 MG tablet    Sig: Take 1 tablet by mouth every 6 (six) hours as needed for severe pain.    Dispense:  4 tablet    Refill:  0    Order Specific Question:  Supervising Provider    Answer:  Eber Hong [[id number]]     Beatriz Quintela Camprubi-Soms, PA-C 07/08/15 1426  Melene Plan, DO 07/08/15 [phone]

## 2015-08-06 DIAGNOSIS — M79604 Pain in right leg: Secondary | ICD-10-CM | POA: Insufficient documentation

## 2015-08-13 DIAGNOSIS — G63 Polyneuropathy in diseases classified elsewhere: Secondary | ICD-10-CM | POA: Insufficient documentation

## 2015-08-19 ENCOUNTER — Emergency Department (INDEPENDENT_AMBULATORY_CARE_PROVIDER_SITE_OTHER)
Admission: EM | Admit: 2015-08-19 | Discharge: 2015-08-19 | Disposition: A | Discharge status: Home / Self Care | Payer: BLUE CROSS/BLUE SHIELD | Source: Home / Self Care | Attending: Emergency Medicine | Admitting: Emergency Medicine

## 2015-08-19 ENCOUNTER — Encounter (HOSPITAL_COMMUNITY): Payer: Self-pay | Admitting: Emergency Medicine

## 2015-08-19 DIAGNOSIS — G8929 Other chronic pain: Secondary | ICD-10-CM | POA: Diagnosis not present

## 2015-08-19 DIAGNOSIS — M792 Neuralgia and neuritis, unspecified: Secondary | ICD-10-CM

## 2015-08-19 NOTE — ED Provider Notes (Signed)
CSN: [id number]     Arrival date & time 08/19/15  1425 History   First MD Initiated Contact with Patient 08/19/15 1622     Chief Complaint  Patient presents with  . 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 He also has HIV and is a smoker. He is currently being [place] and [place] [facility]. He is a member of a pain management clinic and he also has a local PCP. He presents to the urgent care for persistent pain of his lower extremities and requesting pain medicine. He has made several visits over the past months to the emergency department and his received proximally 6 prescriptions for narcotics in low volumes or units. He notes the pain is not any different or worse today than other days. Location, character and intensity is unchanged. Although he described his pain as unbearable to the nurse he describes it as typical for his neuropathic pain. He was recently started on Lyrica this week and states it is not helping.   Past Medical History  Diagnosis Date  . HIV disease (HCC)   . Patella-femoral syndrome   . Neuropathy [facility](HCC)    Past Surgical History  Procedure Laterality Date  . Back surgery     No family history on file. Social History  Substance Use Topics  . Smoking status: Current Every Day Smoker -- 0.50 packs/day    Types: Cigarettes  . Smokeless tobacco: Never Used  . Alcohol Use: No    Review of Systems  Constitutional: Negative.   Respiratory: Negative.   Musculoskeletal:       Leg pain as per history of present illness  Skin: Negative.   Neurological: Negative.   All other systems reviewed and are negative.   Allergies  Review of patient's allergies indicates no known allergies.  Home Medications   Prior to Admission medications   Medication Sig Start Date End Date Taking? Authorizing Provider   DULoxetine (CYMBALTA) 30 MG capsule Take 30-60 mg by mouth 3 (three) times daily. Takes two capsules in the morning and one capsule at night. 05/31/15  Yes Historical Provider, MD  elvitegravir-cobicistat-emtricitabine-tenofovir (STRIBILD) 150-150-200-300 MG TABS Take 1 tablet by mouth at bedtime.   Yes Historical Provider, MD  pregabalin (LYRICA) 75 MG capsule Take 75 mg by mouth 3 (three) times daily.   Yes Historical Provider, MD  acetaminophen (TYLENOL) 500 MG tablet Take 1,000 mg by mouth every 6 (six) hours as needed for moderate pain.    Historical Provider, MD  dextromethorphan-guaiFENesin (MUCINEX DM) 30-600 MG 12hr tablet Take 1 tablet by mouth 2 (two) times daily. 06/13/15   Vanetta MuldersScott Zackowski, MD  gabapentin (NEURONTIN) 300 MG capsule Take 1 capsule (300 mg total) by mouth 3 (three) times daily. Take 300mg  at night on day 1, 300mg  twice a day on day 2, 300mg  three times on day 3 and every day thereafter until you see your primary doctor Patient taking differently: Take 900 mg by mouth 3 (three) times daily.  02/11/15   Antony MaduraKelly Humes, PA-C  HYDROcodone-acetaminophen (NORCO) 5-325 MG tablet Take 1 tablet by mouth every 6 (six) hours as needed for severe pain. 07/08/15   Mercedes Camprubi-Soms, PA-C  naproxen (NAPROSYN) 500 MG tablet Take 1 tablet (500 mg total) by mouth 2 (two) times daily. Patient not taking: Reported on 06/20/2015 06/13/15   Vanetta MuldersScott Zackowski, MD  oxyCODONE-acetaminophen (PERCOCET/ROXICET) 5-325 MG tablet Take 2 tablets by mouth  every 4 (four) hours as needed for severe pain. 06/20/15   Margarita Grizzle, MD   Meds Ordered and Administered this Visit  Medications - No data to display  BP 111/75 mmHg  Pulse 79  Temp(Src) 98.3 F (36.8 C) (Oral)  Resp 16  SpO2 98% No data found.   Physical Exam  Constitutional: He is oriented to person, place, and time. He appears well-developed and well-nourished. No distress.  Eyes: EOM are normal.  Neck: Normal range of motion. Neck supple.   Cardiovascular: Normal rate.   Pulmonary/Chest: Effort normal. No respiratory distress.  Musculoskeletal: He exhibits no edema.  Lower extremity strength is 5 over 5. Knee extension and flexion is intact. No skin lesions are visible. Distal neurovascular and motor sensory are grossly intact.  Neurological: He is alert and oriented to person, place, and time. He exhibits normal muscle tone.  Skin: Skin is warm and dry.  Psychiatric: He has a normal mood and affect.  Nursing note and vitals reviewed.   ED Course  Procedures (including critical care time)  Labs Review Labs Reviewed - No data to display  Imaging Review No results found.   Visual Acuity Review  Right Eye Distance:   Left Eye Distance:   Bilateral Distance:    Right Eye Near:   Left Eye Near:    Bilateral Near:         MDM   1. Neuropathic pain   2. Chronic pain    Follow-up with your primary care doctor or pain clinic physicians for her pain management and any prescriptions that she may need. The urgent care he is unable to provide narcotic analgesics for chronic pain.    Hayden Rasmussen, NP 08/19/15 [phone]

## 2015-08-19 NOTE — ED Notes (Signed)
PT reports bilateral leg pain for over a year. PT has seen a doctor and has been diagnosed with neuropathy. [facility]. [facility] [place] [facility].  Pt was placed on Lyrica on Monday and it is not helping. PT reports his pain is "unbearable."

## 2015-08-19 NOTE — Discharge Instructions (Signed)
Neuropathic Pain Follow-up with your primary care doctor or pain clinic physicians for her pain management and any prescriptions that she may need. The urgent care he is unable to provide narcotic analgesics for chronic pain. Neuropathic pain is pain caused by damage to the nerves that are responsible for certain sensations in your body (sensory nerves). The pain can be caused by damage to:   The sensory nerves that send signals to your spinal cord and brain (peripheral nervous system).  The sensory nerves in your brain or spinal cord (central nervous system). Neuropathic pain can make you more sensitive to pain. What would be a minor sensation for most people may feel very painful if you have neuropathic pain. This is usually a long-term condition that can be difficult to treat. The type of pain can differ from person to person. It may start suddenly (acute), or it may develop slowly and last for a long time (chronic). Neuropathic pain may come and go as damaged nerves heal or may stay at the same level for years. It often causes emotional distress, loss of sleep, and a lower quality of life. CAUSES  The most common cause of damage to a sensory nerve is diabetes. Many other diseases and conditions can also cause neuropathic pain. Causes of neuropathic pain can be classified as:  Toxic. Many drugs and chemicals can cause toxic damage. The most common cause of toxic neuropathic pain is damage from drug treatment for cancer (chemotherapy).  Metabolic. This type of pain can happen when a disease causes imbalances that damage nerves. Diabetes is the most common of these diseases. Vitamin B deficiency caused by long-term alcohol abuse is another common cause.  Traumatic. Any injury that cuts, crushes, or stretches a nerve can cause damage and pain. A common example is feeling pain after losing an arm or leg (phantom limb pain).  Compression-related. If a sensory nerve gets trapped or compressed for a  long period of time, the blood supply to the nerve can be cut off.  Vascular. Many blood vessel diseases can cause neuropathic pain by decreasing blood supply and oxygen to nerves.  Autoimmune. This type of pain results from diseases in which the body's defense system mistakenly attacks sensory nerves. Examples of autoimmune diseases that can cause neuropathic pain include lupus and multiple sclerosis.  Infectious. Many types of viral infections can damage sensory nerves and cause pain. Shingles infection is a common cause of this type of pain.  Inherited. Neuropathic pain can be a symptom of many diseases that are passed down through families (genetic). SIGNS AND SYMPTOMS  The main symptom is pain. Neuropathic pain is often described as:  Burning.  Shock-like.  Stinging.  Hot or cold.  Itching. DIAGNOSIS  No single test can diagnose neuropathic pain. Your health care provider will do a physical exam and ask you about your pain. You may use a pain scale to describe how bad your pain is. You may also have tests to see if you have a high sensitivity to pain and to help find the cause and location of any sensory nerve damage. These tests may include:  Imaging studies, such as:  X-rays.  CT scan.  MRI.  Nerve conduction studies to test how well nerve signals travel through your sensory nerves (electrodiagnostic testing).  Stimulating your sensory nerves through electrodes on your skin and measuring the response in your spinal cord and brain (somatosensory evoked potentials). TREATMENT  Treatment for neuropathic pain may change over time. You may  need to try different treatment options or a combination of treatments. Some options include:  Over-the-counter pain relievers.  Prescription medicines. Some medicines used to treat other conditions may also help neuropathic pain. These include medicines to:  Control seizures (anticonvulsants).  Relieve depression  (antidepressants).  Prescription-strength pain relievers (narcotics). These are usually used when other pain relievers do not help.  Transcutaneous nerve stimulation (TENS). This uses electrical currents to block painful nerve signals. The treatment is painless.  Topical and local anesthetics. These are medicines that numb the nerves. They can be injected as a nerve block or applied to the skin.  Alternative treatments, such as:  Acupuncture.  Meditation.  Massage.  Physical therapy.  Pain management programs.  Counseling. HOME CARE INSTRUCTIONS  Learn as much as you can about your condition.  Take medicines only as directed by your health care provider.  Work closely with all your health care providers to find what works best for you.  Have a good support system at home.  Consider joining a chronic pain support group. SEEK MEDICAL CARE IF:  Your pain treatments are not helping.  You are having side effects from your medicines.  You are struggling with fatigue, mood changes, depression, or anxiety.   This information is not intended to replace advice given to you by your health care provider. Make sure you discuss any questions you have with your health care provider.   Document Released: 02/11/2004 Document Revised: 06/06/2014 Document Reviewed: 10/24/2013 Elsevier Interactive Patient Education [employer]

## 2015-09-24 ENCOUNTER — Encounter (HOSPITAL_COMMUNITY): Payer: Self-pay | Admitting: Emergency Medicine

## 2015-09-24 ENCOUNTER — Emergency Department (HOSPITAL_COMMUNITY)
Admission: EM | Admit: 2015-09-24 | Discharge: 2015-09-24 | Disposition: A | Discharge status: Home / Self Care | Payer: BLUE CROSS/BLUE SHIELD | Attending: Emergency Medicine | Admitting: Emergency Medicine

## 2015-09-24 DIAGNOSIS — B070 Plantar wart: Secondary | ICD-10-CM | POA: Insufficient documentation

## 2015-09-24 DIAGNOSIS — Z79899 Other long term (current) drug therapy: Secondary | ICD-10-CM | POA: Diagnosis not present

## 2015-09-24 DIAGNOSIS — G629 Polyneuropathy, unspecified: Secondary | ICD-10-CM | POA: Diagnosis not present

## 2015-09-24 DIAGNOSIS — Z21 Asymptomatic human immunodeficiency virus [HIV] infection status: Secondary | ICD-10-CM | POA: Diagnosis not present

## 2015-09-24 DIAGNOSIS — F17210 Nicotine dependence, cigarettes, uncomplicated: Secondary | ICD-10-CM | POA: Diagnosis not present

## 2015-09-24 DIAGNOSIS — M5020 Other cervical disc displacement, unspecified cervical region: Secondary | ICD-10-CM | POA: Insufficient documentation

## 2015-09-24 DIAGNOSIS — M792 Neuralgia and neuritis, unspecified: Secondary | ICD-10-CM | POA: Insufficient documentation

## 2015-09-24 DIAGNOSIS — M79671 Pain in right foot: Secondary | ICD-10-CM | POA: Diagnosis present

## 2015-09-24 NOTE — ED Provider Notes (Signed)
CSN: [id number]     Arrival date & time 09/24/15  0856 History   First MD Initiated Contact with Patient 09/24/15 [phone]     Chief Complaint  Patient presents with  . Foot Pain     (Consider location/radiation/quality/duration/timing/severity/associated sxs/prior Treatment) HPI 30 year old male presents with a painful spot on his right foot for the past 1 week. It is on the plantar lateral aspect. Hurts more when he is walking or if he pinches it hard. At rest it has minimal pain. Patient is a history of HIV and chronic peripheral neuropathy. The peripheral neuropathy is not worse. There is no drainage from this lesion. No redness. Denies any known injury.  Past Medical History  Diagnosis Date  . HIV disease (HCC)   . Patella-femoral syndrome   . Neuropathy [facility])    Past Surgical History  Procedure Laterality Date  . Back surgery     History reviewed. No pertinent family history. Social History  Substance Use Topics  . Smoking status: Current Every Day Smoker -- 0.50 packs/day    Types: Cigarettes  . Smokeless tobacco: Never Used  . Alcohol Use: No    Review of Systems  Constitutional: Negative for fever.  Musculoskeletal: Positive for arthralgias.  Skin: Negative for color change.  Neurological: Positive for numbness (chronic peripheral neuropathy). Negative for weakness.  All other systems reviewed and are negative.     Allergies  Review of patient's allergies indicates no known allergies.  Home Medications   Prior to Admission medications   Medication Sig Start Date End Date Taking? Authorizing Provider  DULoxetine (CYMBALTA) 30 MG capsule Take 60 mg by mouth 3 (three) times daily.  05/31/15  Yes Historical Provider, MD  elvitegravir-cobicistat-emtricitabine-tenofovir (STRIBILD) 150-150-200-300 MG TABS Take 1 tablet by mouth at bedtime.   Yes Historical Provider, MD  pregabalin (LYRICA) 75 MG capsule Take 75 mg by mouth 3 (three) times daily.   Yes Historical  Provider, MD  dextromethorphan-guaiFENesin (MUCINEX DM) 30-600 MG 12hr tablet Take 1 tablet by mouth 2 (two) times daily. Patient not taking: Reported on 09/24/2015 06/13/15   Vanetta Mulders, MD  gabapentin (NEURONTIN) 300 MG capsule Take 1 capsule (300 mg total) by mouth 3 (three) times daily. Take  at night on day 1,  twice a day on day 2,  three times on day 3 and every day thereafter until you see your primary doctor Patient not taking: Reported on 09/24/2015 02/11/15   Antony Madura, PA-C  HYDROcodone-acetaminophen (NORCO) 5-325 MG tablet Take 1 tablet by mouth every 6 (six) hours as needed for severe pain. Patient not taking: Reported on 09/24/2015 07/08/15   Mercedes Camprubi-Soms, PA-C  naproxen (NAPROSYN) 500 MG tablet Take 1 tablet (500 mg total) by mouth 2 (two) times daily. Patient not taking: Reported on 06/20/2015 06/13/15   Vanetta Mulders, MD  oxyCODONE-acetaminophen (PERCOCET/ROXICET) 5-325 MG tablet Take 2 tablets by mouth every 4 (four) hours as needed for severe pain. Patient not taking: Reported on 09/24/2015 06/20/15   Margarita Grizzle, MD   BP 115/81 mmHg  Pulse 78  Temp(Src) 97.9 F (36.6 C) (Oral)  Resp 18  SpO2 100% Physical Exam  Constitutional: He is oriented to person, place, and time. He appears well-developed and well-nourished.  HENT:  Head: Normocephalic and atraumatic.  Right Ear: External ear normal.  Left Ear: External ear normal.  Nose: Nose normal.  Eyes: Right eye exhibits no discharge. Left eye exhibits no discharge.  Cardiovascular: Regular rhythm.   Pulmonary/Chest: Effort normal.  Abdominal: He exhibits no distension.  Musculoskeletal:       Right foot: There is tenderness (mild). There is normal range of motion, no bony tenderness and no swelling.       Feet:  Neurological: He is alert and oriented to person, place, and time.  Skin: Skin is warm and dry.  Nursing note and vitals reviewed.   ED Course  Procedures (including critical  care time) Labs Review Labs Reviewed - No data to display  Imaging Review No results found. I have personally reviewed and evaluated these images and lab results as part of my medical decision-making.   EKG Interpretation None      MDM   Final diagnoses:  Plantar wart of right foot    Presentation is consistent with plantar warts of his right foot. No signs of abscess or cellulitis. Recommend over-the-counter treatments and will refer to podiatry if not improving.    Pricilla LovelessScott Jaydalee Bardwell, MD 09/24/15 [phone]

## 2015-09-24 NOTE — ED Notes (Signed)
Hx of peripheral neuropathy, has been seen for same. States he's no longer taking gabapentin, was switched to lyrica and it hasn't been working as well. States he noticed an sore on the bottom of his right foot one week prior and wanted to get it examined. Scabbed over area with darkened edges, no drainage/smell/warmth observed

## 2015-11-24 DIAGNOSIS — M4722 Other spondylosis with radiculopathy, cervical region: Secondary | ICD-10-CM | POA: Insufficient documentation

## 2016-03-22 DIAGNOSIS — J309 Allergic rhinitis, unspecified: Secondary | ICD-10-CM | POA: Insufficient documentation

## 2016-03-22 DIAGNOSIS — Z981 Arthrodesis status: Secondary | ICD-10-CM | POA: Insufficient documentation

## 2016-07-12 IMAGING — CR DG CHEST 2V
2 series · 2 of 2 positions shown · non-contrast
Comparison: 05/26/2014

CLINICAL DATA: Productive cough x 4 days accompanied with central
chest pain when coughing and SOB. H/o HIV. Smoker x approximately 11
years.

EXAM:
CHEST - 2 VIEW

[w chest pa]
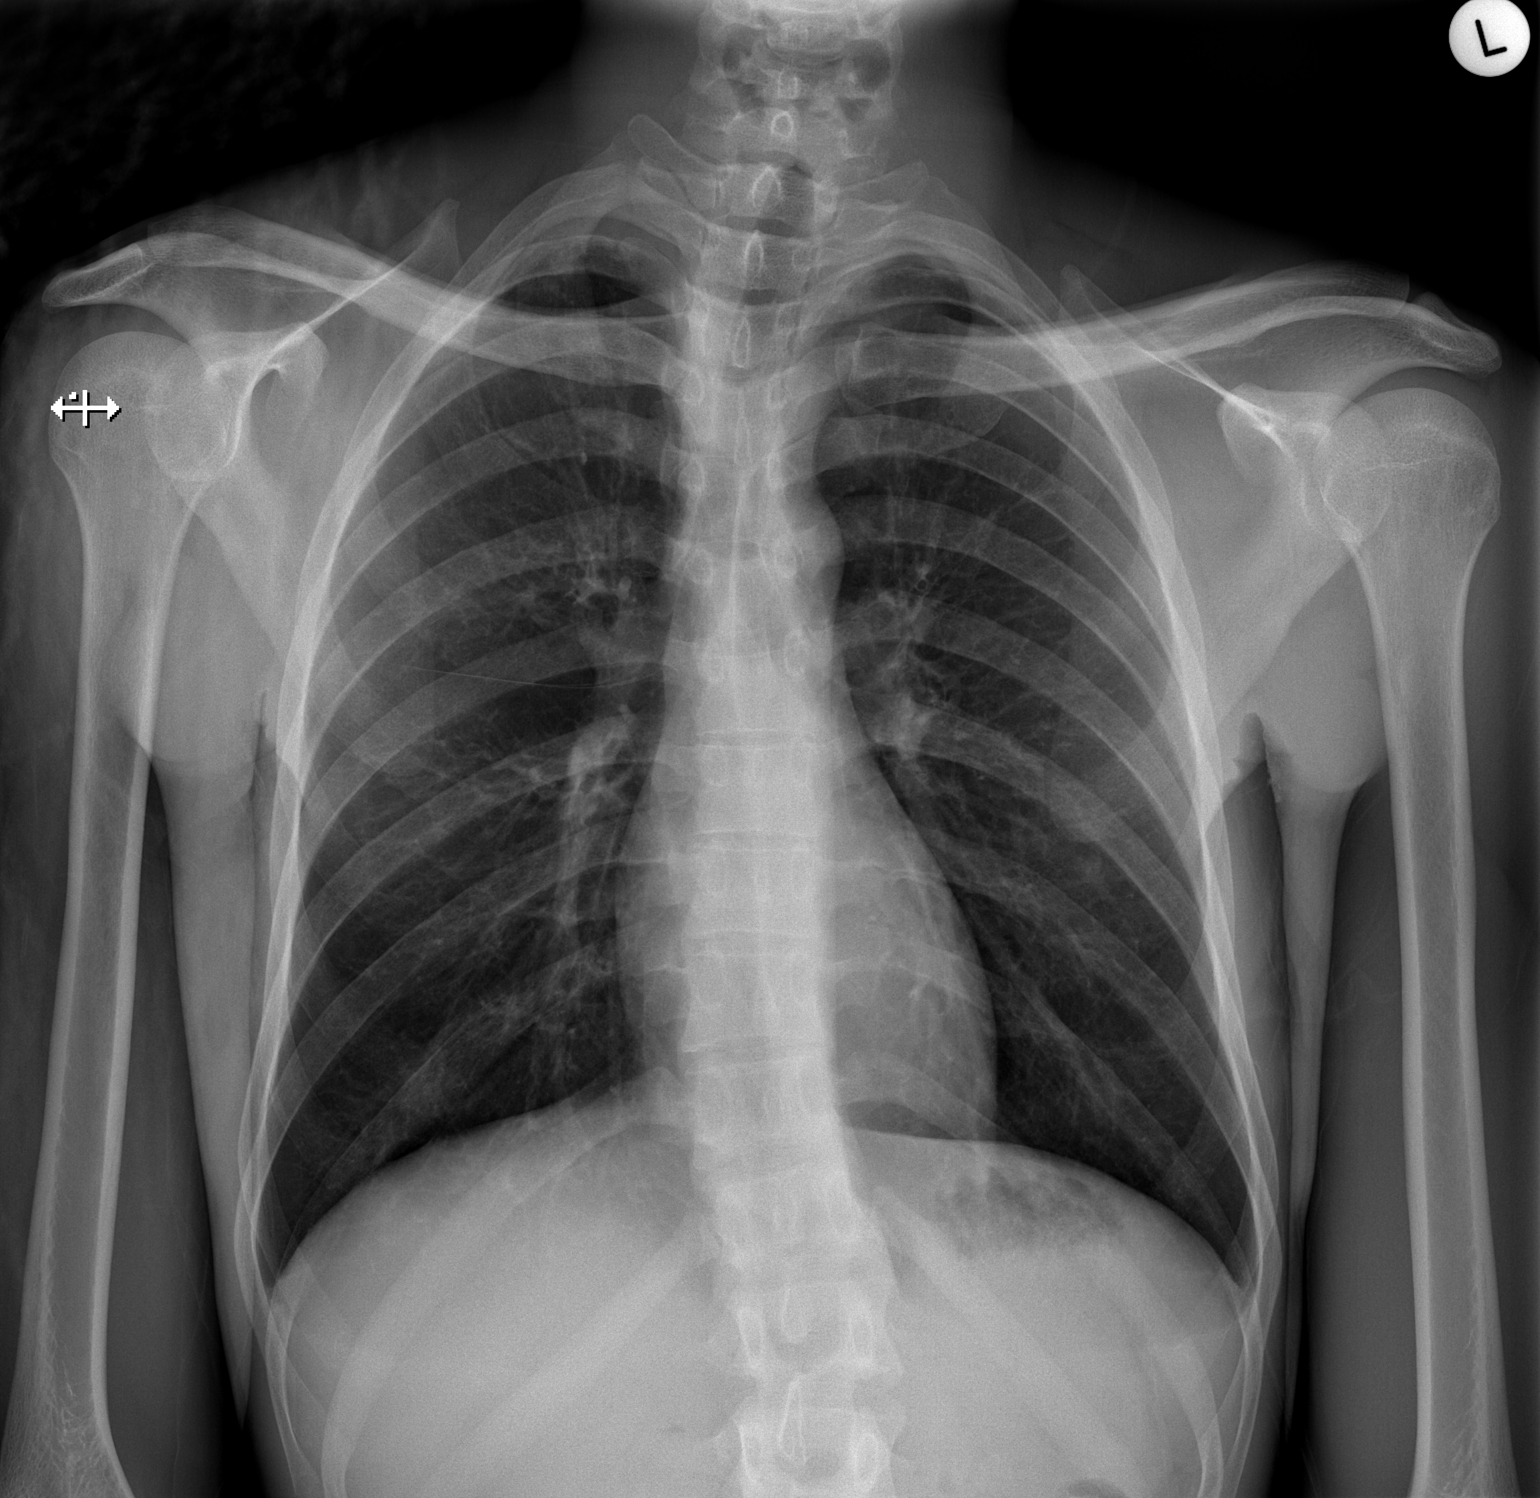

[w chest lat]
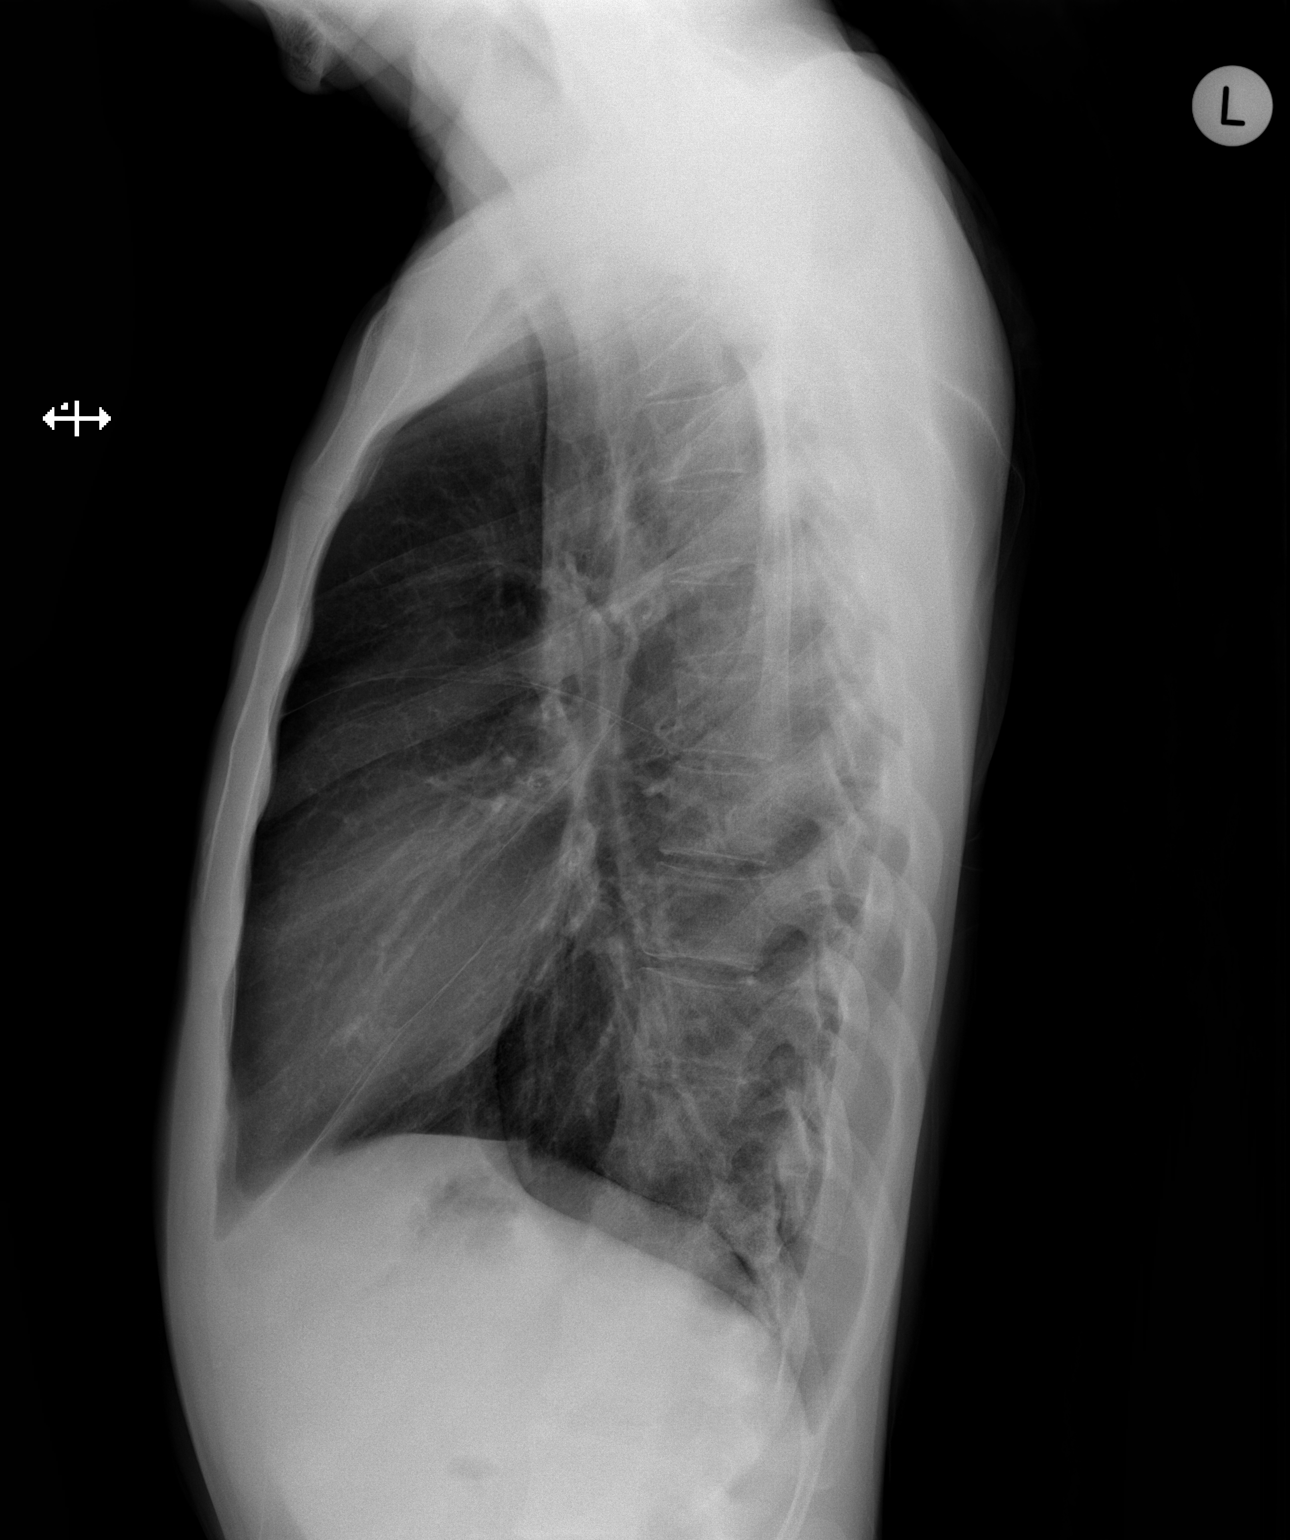

[2 of 2 positions shown; findings below may reference images not displayed]

FINDINGS: Lungs are clear. Heart size and mediastinal contours are within
normal limits.
No effusion.
Visualized skeletal structures are unremarkable.
IMPRESSION: No acute cardiopulmonary disease.

## 2016-08-09 DIAGNOSIS — M79675 Pain in left toe(s): Secondary | ICD-10-CM | POA: Insufficient documentation

## 2016-11-25 DIAGNOSIS — B079 Viral wart, unspecified: Secondary | ICD-10-CM | POA: Insufficient documentation

## 2016-11-25 DIAGNOSIS — R112 Nausea with vomiting, unspecified: Secondary | ICD-10-CM | POA: Insufficient documentation

## 2016-12-12 ENCOUNTER — Ambulatory Visit
Admission: RE | Admit: 2016-12-12 | Discharge: 2016-12-12 | Disposition: A | Discharge status: Home / Self Care | Attending: Ambulatory Care

## 2016-12-12 DIAGNOSIS — R200 Anesthesia of skin: Principal | ICD-10-CM

## 2016-12-12 DIAGNOSIS — M792 Neuralgia and neuritis, unspecified: Secondary | ICD-10-CM

## 2016-12-12 DIAGNOSIS — G9520 Unspecified cord compression: Secondary | ICD-10-CM

## 2016-12-12 MED ORDER — DULOXETINE 30 MG CAPSULE,DELAYED RELEASE
ORAL_CAPSULE | Freq: Two times a day (BID) | ORAL | 2 refills | 0 days | Status: CP
Start: 2016-12-12 — End: 2017-01-10

## 2016-12-12 MED ORDER — LYRICA 100 MG CAPSULE
ORAL_CAPSULE | Freq: Three times a day (TID) | ORAL | 2 refills | 0 days | Status: CP
Start: 2016-12-12 — End: 2017-01-10

## 2016-12-12 MED ORDER — BUPRENORPHINE 7.5 MCG/HOUR WEEKLY TRANSDERMAL PATCH
MEDICATED_PATCH | TRANSDERMAL | 1 refills | 0 days | Status: CP
Start: 2016-12-12 — End: 2017-01-10

## 2016-12-12 MED ORDER — METAXALONE 800 MG TABLET
ORAL_TABLET | Freq: Three times a day (TID) | ORAL | 1 refills | 0.00000 days | Status: CP
Start: 2016-12-12 — End: 2017-01-10

## 2017-01-10 ENCOUNTER — Ambulatory Visit: Admission: RE | Admit: 2017-01-10 | Discharge: 2017-01-10 | Disposition: A | Discharge status: Home / Self Care

## 2017-01-10 DIAGNOSIS — M79604 Pain in right leg: Secondary | ICD-10-CM

## 2017-01-10 DIAGNOSIS — M4802 Spinal stenosis, cervical region: Secondary | ICD-10-CM

## 2017-01-10 DIAGNOSIS — G894 Chronic pain syndrome: Secondary | ICD-10-CM

## 2017-01-10 DIAGNOSIS — R200 Anesthesia of skin: Principal | ICD-10-CM

## 2017-01-10 DIAGNOSIS — M792 Neuralgia and neuritis, unspecified: Secondary | ICD-10-CM

## 2017-01-10 DIAGNOSIS — M79605 Pain in left leg: Secondary | ICD-10-CM

## 2017-01-10 DIAGNOSIS — G9520 Unspecified cord compression: Secondary | ICD-10-CM

## 2017-01-10 MED ORDER — LYRICA 100 MG CAPSULE
ORAL_CAPSULE | Freq: Three times a day (TID) | ORAL | 2 refills | 0 days | Status: CP
Start: 2017-01-10 — End: 2017-05-19

## 2017-01-10 MED ORDER — BUPRENORPHINE 5 MCG/HOUR WEEKLY TRANSDERMAL PATCH
MEDICATED_PATCH | TRANSDERMAL | 0 refills | 0 days | Status: CP
Start: 2017-01-10 — End: 2017-10-18

## 2017-01-10 MED ORDER — METAXALONE 800 MG TABLET
ORAL_TABLET | Freq: Three times a day (TID) | ORAL | 1 refills | 0.00000 days | Status: CP
Start: 2017-01-10 — End: 2017-10-18

## 2017-01-10 MED ORDER — DULOXETINE 30 MG CAPSULE,DELAYED RELEASE
ORAL_CAPSULE | Freq: Two times a day (BID) | ORAL | 2 refills | 0 days | Status: CP
Start: 2017-01-10 — End: 2017-05-19

## 2017-02-14 ENCOUNTER — Emergency Department (HOSPITAL_COMMUNITY)
Admission: EM | Admit: 2017-02-14 | Discharge: 2017-02-14 | Disposition: A | Discharge status: Home / Self Care | Payer: BLUE CROSS/BLUE SHIELD | Attending: Emergency Medicine | Admitting: Emergency Medicine

## 2017-02-14 ENCOUNTER — Encounter (HOSPITAL_COMMUNITY): Payer: Self-pay | Admitting: *Deleted

## 2017-02-14 DIAGNOSIS — M79601 Pain in right arm: Secondary | ICD-10-CM | POA: Diagnosis not present

## 2017-02-14 DIAGNOSIS — M545 Low back pain: Secondary | ICD-10-CM | POA: Diagnosis present

## 2017-02-14 DIAGNOSIS — Y92410 Unspecified street and highway as the place of occurrence of the external cause: Secondary | ICD-10-CM | POA: Insufficient documentation

## 2017-02-14 DIAGNOSIS — Y998 Other external cause status: Secondary | ICD-10-CM | POA: Diagnosis not present

## 2017-02-14 DIAGNOSIS — Y9389 Activity, other specified: Secondary | ICD-10-CM | POA: Insufficient documentation

## 2017-02-14 DIAGNOSIS — Z5321 Procedure and treatment not carried out due to patient leaving prior to being seen by health care provider: Secondary | ICD-10-CM | POA: Diagnosis not present

## 2017-02-14 DIAGNOSIS — M79605 Pain in left leg: Secondary | ICD-10-CM | POA: Diagnosis not present

## 2017-02-14 DIAGNOSIS — M79604 Pain in right leg: Secondary | ICD-10-CM | POA: Insufficient documentation

## 2017-02-14 DIAGNOSIS — M79602 Pain in left arm: Secondary | ICD-10-CM | POA: Diagnosis not present

## 2017-02-14 DIAGNOSIS — M542 Cervicalgia: Secondary | ICD-10-CM | POA: Insufficient documentation

## 2017-02-14 NOTE — ED Notes (Signed)
Called multiple times in the waiting room without response

## 2017-02-14 NOTE — ED Notes (Signed)
Pt is upset that he is asked to go back to the waiting area, he states he is leaving to go to [facility] because he is hurting.
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
## 2017-02-15 ENCOUNTER — Emergency Department (HOSPITAL_COMMUNITY)
Admission: EM | Admit: 2017-02-15 | Discharge: 2017-02-15 | Disposition: A | Discharge status: Home / Self Care | Payer: BLUE CROSS/BLUE SHIELD | Attending: Physician Assistant | Admitting: Physician Assistant

## 2017-02-15 ENCOUNTER — Encounter (HOSPITAL_COMMUNITY): Payer: Self-pay | Admitting: *Deleted

## 2017-02-15 ENCOUNTER — Emergency Department (HOSPITAL_COMMUNITY): Payer: BLUE CROSS/BLUE SHIELD

## 2017-02-15 DIAGNOSIS — Z041 Encounter for examination and observation following transport accident: Secondary | ICD-10-CM | POA: Diagnosis not present

## 2017-02-15 DIAGNOSIS — M545 Low back pain: Secondary | ICD-10-CM | POA: Diagnosis not present

## 2017-02-15 DIAGNOSIS — Z8739 Personal history of other diseases of the musculoskeletal system and connective tissue: Secondary | ICD-10-CM | POA: Diagnosis not present

## 2017-02-15 DIAGNOSIS — Z79899 Other long term (current) drug therapy: Secondary | ICD-10-CM | POA: Diagnosis not present

## 2017-02-15 DIAGNOSIS — F17210 Nicotine dependence, cigarettes, uncomplicated: Secondary | ICD-10-CM | POA: Diagnosis not present

## 2017-02-15 MED ORDER — IBUPROFEN 800 MG PO TABS
800.0000 mg | ORAL_TABLET | Freq: Once | ORAL | Status: AC
  Administered 2017-02-15: 800 mg via ORAL
  Filled 2017-02-15: qty 1

## 2017-02-15 MED ORDER — CYCLOBENZAPRINE HCL 10 MG PO TABS
5.0000 mg | ORAL_TABLET | Freq: Once | ORAL | Status: AC
  Administered 2017-02-15: 5 mg via ORAL
  Filled 2017-02-15: qty 1

## 2017-02-15 MED ORDER — CYCLOBENZAPRINE HCL 10 MG PO TABS: 10.0000 mg | ORAL_TABLET | Freq: Two times a day (BID) | ORAL | 0 refills | Status: DC | PRN
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
## 2017-05-04 ENCOUNTER — Encounter (HOSPITAL_COMMUNITY): Payer: Self-pay | Admitting: Emergency Medicine

## 2017-05-04 ENCOUNTER — Emergency Department (HOSPITAL_COMMUNITY)
Admission: EM | Admit: 2017-05-04 | Discharge: 2017-05-04 | Disposition: A | Discharge status: Home / Self Care | Payer: BLUE CROSS/BLUE SHIELD | Attending: Emergency Medicine | Admitting: Emergency Medicine

## 2017-05-04 DIAGNOSIS — Z21 Asymptomatic human immunodeficiency virus [HIV] infection status: Secondary | ICD-10-CM | POA: Insufficient documentation

## 2017-05-04 DIAGNOSIS — Z79899 Other long term (current) drug therapy: Secondary | ICD-10-CM | POA: Diagnosis not present

## 2017-05-04 DIAGNOSIS — M7918 Myalgia, other site: Secondary | ICD-10-CM | POA: Diagnosis not present

## 2017-05-04 DIAGNOSIS — M542 Cervicalgia: Secondary | ICD-10-CM | POA: Diagnosis present

## 2017-05-04 DIAGNOSIS — F17210 Nicotine dependence, cigarettes, uncomplicated: Secondary | ICD-10-CM | POA: Insufficient documentation

## 2017-05-04 DIAGNOSIS — M791 Myalgia, unspecified site: Secondary | ICD-10-CM

## 2017-05-04 LAB — COMPREHENSIVE METABOLIC PANEL
ALBUMIN: 4.1 g/dL (ref 3.5–5.0)
ALT: 11 U/L — ABNORMAL LOW (ref 17–63)
AST: 28 U/L (ref 15–41)
Alkaline Phosphatase: 65 U/L (ref 38–126)
Anion gap: 9 (ref 5–15)
BUN: 10 mg/dL (ref 6–20)
CHLORIDE: 101 mmol/L (ref 101–111)
CO2: 26 mmol/L (ref 22–32)
Calcium: 9 mg/dL (ref 8.9–10.3)
Creatinine, Ser: 0.98 mg/dL (ref 0.61–1.24)
GFR calc Af Amer: >60 mL/min (ref >60)
GFR calc non Af Amer: >60 mL/min (ref >60)
GLUCOSE: 101 mg/dL — AB (ref 65–99)
POTASSIUM: 4.2 mmol/L (ref 3.5–5.1)
SODIUM: 136 mmol/L (ref 135–145)
Total Bilirubin: 0.7 mg/dL (ref 0.3–1.2)
Total Protein: 7.4 g/dL (ref 6.5–8.1)

## 2017-05-04 LAB — I-STAT CG4 LACTIC ACID, ED
Lactic Acid, Venous: 2.01 mmol/L (ref 0.5–1.9)
Lactic Acid, Venous: 1.14 mmol/L (ref 0.5–1.9)

## 2017-05-04 LAB — CBC WITH DIFFERENTIAL/PLATELET
BASOS ABS: 0 10*3/uL (ref 0.0–0.1)
BASOS PCT: 0 %
EOS ABS: 0.4 10*3/uL (ref 0.0–0.7)
EOS PCT: 5 %
HCT: 40.6 % (ref 39.0–52.0)
Hemoglobin: 13.6 g/dL (ref 13.0–17.0)
Lymphocytes Relative: 19 %
Lymphs Abs: 1.8 10*3/uL (ref 0.7–4.0)
MCH: 28.8 pg (ref 26.0–34.0)
MCHC: 33.5 g/dL (ref 30.0–36.0)
MCV: 85.8 fL (ref 78.0–100.0)
MONO ABS: 1 10*3/uL (ref 0.1–1.0)
Monocytes Relative: 10 %
Neutro Abs: 6.3 10*3/uL (ref 1.7–7.7)
Neutrophils Relative %: 66 %
PLATELETS: 264 10*3/uL (ref 150–400)
RBC: 4.73 MIL/uL (ref 4.22–5.81)
RDW: 14.6 % (ref 11.5–15.5)
WBC: 9.5 10*3/uL (ref 4.0–10.5)

## 2017-05-04 LAB — URINALYSIS, ROUTINE W REFLEX MICROSCOPIC
BILIRUBIN URINE: NEGATIVE
Glucose, UA: NEGATIVE mg/dL
Hgb urine dipstick: NEGATIVE
KETONES UR: NEGATIVE mg/dL
Leukocytes, UA: NEGATIVE
Nitrite: NEGATIVE
PH: 5 (ref 5.0–8.0)
PROTEIN: NEGATIVE mg/dL
Specific Gravity, Urine: 1.027 (ref 1.005–1.030)

## 2017-05-04 LAB — CK: Total CK: 130 U/L (ref 49–397)

## 2017-05-04 MED ORDER — SODIUM CHLORIDE 0.9 % IV BOLUS (SEPSIS)
1000.0000 mL | Freq: Once | INTRAVENOUS | Status: AC
  Administered 2017-05-04: 1000 mL via INTRAVENOUS

## 2017-05-04 MED ORDER — KETOROLAC TROMETHAMINE 15 MG/ML IJ SOLN
15.0000 mg | Freq: Once | INTRAMUSCULAR | Status: AC
  Administered 2017-05-04: 15 mg via INTRAVENOUS
  Filled 2017-05-04: qty 1

## 2017-05-04 NOTE — ED Triage Notes (Signed)
Patient here from home with complaints of neck pain, bilateral arm and leg pain. Reports hx of polyneuropathy. States that he took his normal meds this morning, lyrica and Cymbalta.

## 2017-05-04 NOTE — ED Provider Notes (Signed)
[facility]-EMERGENCY DEPT Provider Note   CSN: [id number] Arrival date & time: 05/04/17  1506     History   Chief Complaint Chief Complaint  Patient presents with  . Neck Pain  . Leg Pain  . Arm Pain    HPI Cody Villarreal is a 31 y.o. male.  The history is provided by the patient. No language interpreter was used.  Neck Pain   Associated symptoms include leg pain.  Leg Pain    Arm Pain     Cody Villarreal is a 31 y.o. male who presents to the Emergency Department complaining of body aches.  He reports 3 days of body aches throughout his neck, back, bilateral arms and bilateral legs.  Neuropathy and states symptoms are similar to prior flares of his neuropathy.  Symptoms are gradual in onset and constant in nature.  They do not vary throughout the day.  He denies any fevers, nausea, vomiting, diarrhea, dysuria.  He is compliant with his medications.  He does have some occasional tingling in bilateral arms.  There is no weakness.  No known sick contacts.  Past Medical History:  Diagnosis Date  . HIV disease (HCC)   . Neuropathy   . Patella-femoral syndrome     There are no active problems to display for this patient.   Past Surgical History:  Procedure Laterality Date  . BACK SURGERY         Home Medications    Prior to Admission medications   Medication Sig Start Date End Date Taking? Authorizing Provider  buprenorphine (BUTRANS - DOSED MCG/HR) 5 MCG/HR PTWK patch Place 1 patch onto the skin every 7 (seven) days. 03/27/17  Yes [provider]  DULoxetine (CYMBALTA) 30 MG capsule Take 60 mg by mouth 3 (three) times daily.  05/31/15  Yes [provider]  GENVOYA 150-150-200-10 MG TABS tablet Take 1 tablet by mouth daily. 04/04/17  Yes [provider]  metaxalone (SKELAXIN) 800 MG tablet Take 800 mg by mouth 3 (three) times daily. 02/24/16  Yes [provider]  pregabalin (LYRICA) 75 MG capsule Take 75 mg by  mouth 3 (three) times daily.   Yes [provider]  cyclobenzaprine (FLEXERIL) 10 MG tablet Take 1 tablet (10 mg total) by mouth 2 (two) times daily as needed for muscle spasms. Patient not taking: Reported on 05/04/2017 02/15/17   McDonald, Pedro EarlsMia A, PA-C  dextromethorphan-guaiFENesin (MUCINEX DM) 30-600 MG 12hr tablet Take 1 tablet by mouth 2 (two) times daily. Patient not taking: Reported on 09/24/2015 06/13/15   Vanetta MuldersZackowski, Scott, MD  gabapentin (NEURONTIN) 300 MG capsule Take 1 capsule (300 mg total) by mouth 3 (three) times daily. Take 300mg  at night on day 1, 300mg  twice a day on day 2, 300mg  three times on day 3 and every day thereafter until you see your primary doctor Patient not taking: Reported on 09/24/2015 02/11/15   Antony MaduraHumes, Kelly, PA-C  HYDROcodone-acetaminophen (NORCO) 5-325 MG tablet Take 1 tablet by mouth every 6 (six) hours as needed for severe pain. Patient not taking: Reported on 09/24/2015 07/08/15   Street, [place]  oxyCODONE-acetaminophen (PERCOCET/ROXICET) 5-325 MG tablet Take 2 tablets by mouth every 4 (four) hours as needed for severe pain. Patient not taking: Reported on 09/24/2015 06/20/15   Margarita Grizzleay, Danielle, MD    Family History No family history on file.  Social History Social History   Tobacco Use  . Smoking status: Current Every Day Smoker    Packs/day: 0.50  Types: Cigarettes  . Smokeless tobacco: Never Used  Substance Use Topics  . Alcohol use: No  . Drug use: No     Allergies   Patient has no known allergies.   Review of Systems Review of Systems  Musculoskeletal: Positive for neck pain.  All other systems reviewed and are negative.    Physical Exam Updated Vital Signs BP (!) 131/97 (BP Location: Left Arm)   Pulse 66   Temp 98.5 F (36.9 C) (Oral)   Resp 18   SpO2 99%   Physical Exam  Constitutional: He is oriented to person, place, and time. He appears well-developed and well-nourished.  HENT:  Head: Normocephalic and  atraumatic.  Cardiovascular: Normal rate and regular rhythm.  No murmur heard. Pulmonary/Chest: Effort normal and breath sounds normal. No respiratory distress.  Abdominal: Soft. There is no tenderness. There is no rebound and no guarding.  Musculoskeletal: He exhibits no edema or tenderness.  Neurological: He is alert and oriented to person, place, and time.  5/5 strength in all four extremities with sensation to light touch intact in all four extremities.   Skin: Skin is warm and dry.  Psychiatric: He has a normal mood and affect. His behavior is normal.  Nursing note and vitals reviewed.    ED Treatments / Results  Labs (all labs ordered are listed, but only abnormal results are displayed) Labs Reviewed  COMPREHENSIVE METABOLIC PANEL - Abnormal; Notable for the following components:      Result Value   Glucose, Bld 101 (*)    ALT 11 (*)    All other components within normal limits  I-STAT CG4 LACTIC ACID, ED - Abnormal; Notable for the following components:   Lactic Acid, Venous 2.01 (*)    All other components within normal limits  CBC WITH DIFFERENTIAL/PLATELET  URINALYSIS, ROUTINE W REFLEX MICROSCOPIC  CK  I-STAT CG4 LACTIC ACID, ED    EKG  EKG Interpretation None       Radiology No results found.  Procedures Procedures (including critical care time)  Medications Ordered in ED Medications  sodium chloride 0.9 % bolus 1,000 mL (0 mLs Intravenous Stopped 05/04/17 1938)  ketorolac (TORADOL) 15 MG/ML injection 15 mg (15 mg Intravenous Given 05/04/17 1905)     Initial Impression / Assessment and Plan / ED Course  I have reviewed the triage vital signs and the nursing notes.  Pertinent labs & imaging results that were available during my care of the patient were reviewed by me and considered in my medical decision making (see chart for details).     Patient with history of HIV and polyneuropathy here for evaluation of generalized body aches.  He is nontoxic  appearing on examination and neurologically intact.  There is no evidence of electrolyte disturbance, rhabdomyolysis, acute infectious process.  After treatment with IV fluids he is feeling improved.  Plan to discharge home with outpatient follow-up.  Discussed with patient home care for myalgias.  Discussed outpatient follow-up and return precautions.  Final Clinical Impressions(s) / ED Diagnoses   Final diagnoses:  Myalgia    ED Discharge Orders    None       Tilden Fossaees, Retal Tonkinson, MD 05/05/17 [phone]

## 2017-05-04 NOTE — ED Notes (Signed)
Dr. Madilyn Hookees made aware at this time of lactic=2.01

## 2017-05-11 ENCOUNTER — Ambulatory Visit
Admission: RE | Admit: 2017-05-11 | Discharge: 2017-05-11 | Disposition: A | Discharge status: Home / Self Care | Payer: BC Managed Care – PPO

## 2017-05-11 ENCOUNTER — Ambulatory Visit
Admission: RE | Admit: 2017-05-11 | Discharge: 2017-05-11 | Disposition: A | Discharge status: Home / Self Care | Payer: BC Managed Care – PPO | Attending: Infectious Disease | Admitting: Infectious Disease

## 2017-05-11 DIAGNOSIS — B20 Human immunodeficiency virus [HIV] disease: Principal | ICD-10-CM

## 2017-05-19 ENCOUNTER — Ambulatory Visit: Admission: RE | Admit: 2017-05-19 | Discharge: 2017-05-19 | Disposition: A | Discharge status: Home / Self Care

## 2017-05-19 DIAGNOSIS — M792 Neuralgia and neuritis, unspecified: Secondary | ICD-10-CM

## 2017-05-19 DIAGNOSIS — M4722 Other spondylosis with radiculopathy, cervical region: Principal | ICD-10-CM

## 2017-05-19 DIAGNOSIS — M25562 Pain in left knee: Secondary | ICD-10-CM

## 2017-05-19 DIAGNOSIS — G63 Polyneuropathy in diseases classified elsewhere: Secondary | ICD-10-CM

## 2017-05-19 DIAGNOSIS — G894 Chronic pain syndrome: Secondary | ICD-10-CM

## 2017-05-19 DIAGNOSIS — M4802 Spinal stenosis, cervical region: Secondary | ICD-10-CM

## 2017-05-19 DIAGNOSIS — M25561 Pain in right knee: Secondary | ICD-10-CM

## 2017-05-19 DIAGNOSIS — R200 Anesthesia of skin: Secondary | ICD-10-CM

## 2017-05-19 DIAGNOSIS — M79604 Pain in right leg: Secondary | ICD-10-CM

## 2017-05-19 DIAGNOSIS — M25569 Pain in unspecified knee: Secondary | ICD-10-CM

## 2017-05-19 DIAGNOSIS — M79605 Pain in left leg: Secondary | ICD-10-CM

## 2017-05-19 DIAGNOSIS — G9520 Unspecified cord compression: Secondary | ICD-10-CM

## 2017-05-19 MED ORDER — LYRICA 100 MG CAPSULE
ORAL_CAPSULE | Freq: Three times a day (TID) | ORAL | 1 refills | 0 days | Status: CP
Start: 2017-05-19 — End: ?

## 2017-05-19 MED ORDER — DULOXETINE 30 MG CAPSULE,DELAYED RELEASE
ORAL_CAPSULE | Freq: Two times a day (BID) | ORAL | 2 refills | 0 days | Status: CP
Start: 2017-05-19 — End: 2017-12-14

## 2017-05-19 MED ORDER — LAMOTRIGINE 25 MG TABLET
ORAL_TABLET | Freq: Every day | ORAL | 1 refills | 0 days | Status: CP
Start: 2017-05-19 — End: 2017-12-14

## 2017-05-25 ENCOUNTER — Encounter (HOSPITAL_COMMUNITY): Payer: Self-pay | Admitting: *Deleted

## 2017-05-25 ENCOUNTER — Emergency Department (HOSPITAL_COMMUNITY): Payer: BLUE CROSS/BLUE SHIELD

## 2017-05-25 ENCOUNTER — Emergency Department (HOSPITAL_COMMUNITY)
Admission: EM | Admit: 2017-05-25 | Discharge: 2017-05-26 | Disposition: A | Discharge status: Home / Self Care | Payer: BLUE CROSS/BLUE SHIELD | Attending: Emergency Medicine | Admitting: Emergency Medicine

## 2017-05-25 DIAGNOSIS — N453 Epididymo-orchitis: Secondary | ICD-10-CM

## 2017-05-25 DIAGNOSIS — Z21 Asymptomatic human immunodeficiency virus [HIV] infection status: Secondary | ICD-10-CM | POA: Diagnosis not present

## 2017-05-25 DIAGNOSIS — Z79899 Other long term (current) drug therapy: Secondary | ICD-10-CM | POA: Diagnosis not present

## 2017-05-25 DIAGNOSIS — N5082 Scrotal pain: Secondary | ICD-10-CM | POA: Diagnosis present

## 2017-05-25 DIAGNOSIS — F17210 Nicotine dependence, cigarettes, uncomplicated: Secondary | ICD-10-CM | POA: Diagnosis not present

## 2017-05-25 LAB — CBC WITH DIFFERENTIAL/PLATELET
Basophils Absolute: 0 10*3/uL (ref 0.0–0.1)
Basophils Relative: 0 %
Eosinophils Absolute: 0.6 10*3/uL (ref 0.0–0.7)
Eosinophils Relative: 7 %
HEMATOCRIT: 36.8 % — AB (ref 39.0–52.0)
Hemoglobin: 11.9 g/dL — ABNORMAL LOW (ref 13.0–17.0)
LYMPHS ABS: 2.9 10*3/uL (ref 0.7–4.0)
LYMPHS PCT: 32 %
MCH: 27.7 pg (ref 26.0–34.0)
MCHC: 32.3 g/dL (ref 30.0–36.0)
MCV: 85.6 fL (ref 78.0–100.0)
MONO ABS: 0.9 10*3/uL (ref 0.1–1.0)
MONOS PCT: 10 %
NEUTROS ABS: 4.6 10*3/uL (ref 1.7–7.7)
Neutrophils Relative %: 51 %
Platelets: 235 10*3/uL (ref 150–400)
RBC: 4.3 MIL/uL (ref 4.22–5.81)
RDW: 14.5 % (ref 11.5–15.5)
WBC: 9.1 10*3/uL (ref 4.0–10.5)

## 2017-05-25 LAB — BASIC METABOLIC PANEL
ANION GAP: 4 — AB (ref 5–15)
BUN: 13 mg/dL (ref 6–20)
CO2: 28 mmol/L (ref 22–32)
Calcium: 8.8 mg/dL — ABNORMAL LOW (ref 8.9–10.3)
Chloride: 108 mmol/L (ref 101–111)
Creatinine, Ser: 1.08 mg/dL (ref 0.61–1.24)
GFR calc Af Amer: >60 mL/min (ref >60)
GFR calc non Af Amer: >60 mL/min (ref >60)
GLUCOSE: 101 mg/dL — AB (ref 65–99)
POTASSIUM: 3.7 mmol/L (ref 3.5–5.1)
Sodium: 140 mmol/L (ref 135–145)

## 2017-05-25 MED ORDER — MORPHINE SULFATE (PF) 4 MG/ML IV SOLN
4.0000 mg | Freq: Once | INTRAVENOUS | Status: AC
  Administered 2017-05-25: 4 mg via INTRAVENOUS
  Filled 2017-05-25: qty 1

## 2017-05-25 MED ORDER — SODIUM CHLORIDE 0.9 % IV BOLUS (SEPSIS)
1000.0000 mL | Freq: Once | INTRAVENOUS | Status: AC
  Administered 2017-05-25: 1000 mL via INTRAVENOUS

## 2017-05-25 NOTE — ED Triage Notes (Signed)
Pt states he has had rt groin pain for 3 days, today swollen on rt side, Pt unable to sit

## 2017-05-26 LAB — URINALYSIS, ROUTINE W REFLEX MICROSCOPIC
GLUCOSE, UA: NEGATIVE mg/dL
Hgb urine dipstick: NEGATIVE
KETONES UR: 5 mg/dL — AB
Leukocytes, UA: NEGATIVE
Nitrite: NEGATIVE
PH: 6 (ref 5.0–8.0)
Protein, ur: NEGATIVE mg/dL
Specific Gravity, Urine: 1.034 — ABNORMAL HIGH (ref 1.005–1.030)

## 2017-05-26 MED ORDER — LEVOFLOXACIN 500 MG PO TABS: 500.0000 mg | ORAL_TABLET | Freq: Every day | ORAL | 0 refills | Status: DC

## 2017-05-26 MED ORDER — CEFTRIAXONE SODIUM 250 MG IJ SOLR
250.0000 mg | Freq: Once | INTRAMUSCULAR | Status: AC
  Administered 2017-05-26: 250 mg via INTRAMUSCULAR
  Filled 2017-05-26: qty 250

## 2017-05-26 MED ORDER — KETOROLAC TROMETHAMINE 30 MG/ML IJ SOLN
30.0000 mg | Freq: Once | INTRAMUSCULAR | Status: AC
  Administered 2017-05-26: 30 mg via INTRAVENOUS
  Filled 2017-05-26: qty 1

## 2017-05-26 MED ORDER — STERILE WATER FOR INJECTION IJ SOLN
INTRAMUSCULAR | Status: AC
  Administered 2017-05-26: 10 mL
  Filled 2017-05-26: qty 10

## 2017-05-26 MED ORDER — DOXYCYCLINE HYCLATE 100 MG PO CAPS: 100.0000 mg | ORAL_CAPSULE | Freq: Two times a day (BID) | ORAL | 0 refills | Status: DC

## 2017-05-26 NOTE — ED Provider Notes (Signed)
Care [facility], [place].  Please see her full H&P.  Cody OrDerek Villarreal is a 31 y.o. male with history of HIV and MSM presents to the emergency department with scrotal swelling and erythema onset approximately 24 hours ago.  He reports that he does participate in anal sex.  Initial provider reports cremaster reflex is intact.  Ultrasound with good blood flow and no evidence of testicular torsion however evidence of orchitis and epididymitis.  Physical Exam  BP 123/60 (BP Location: Right Arm)   Pulse 60   Temp 98 F (36.7 C) (Oral)   Resp 18   Ht 5' 8.5" (1.74 m)   Wt 61.2 kg (135 lb)   SpO2 100%   BMI 20.23 kg/m   Physical Exam  Constitutional: He appears well-developed and well-nourished. No distress.  HENT:  Head: Normocephalic.  Eyes: Conjunctivae are normal. No scleral icterus.  Neck: Normal range of motion.  Cardiovascular: Normal rate and intact distal pulses.  Pulmonary/Chest: Effort normal.  Musculoskeletal: Normal range of motion.  Neurological: He is alert.  Skin: Skin is warm and dry.  Nursing note and vitals reviewed.   Results for orders placed Villarreal performed during the hospital encounter of 05/25/17  Basic metabolic panel  Result Value Ref Range   Sodium 140 135 - 145 mmol/L   Potassium 3.7 3.5 - 5.1 mmol/L   Chloride 108 101 - 111 mmol/L   CO2 28 22 - 32 mmol/L   Glucose, Bld 101 (H) 65 - 99 mg/dL   BUN 13 6 - 20 mg/dL   Creatinine, Ser 6.961.08 0.61 - 1.24 mg/dL   Calcium 8.8 (L) 8.9 - 10.3 mg/dL   GFR calc non Af Amer >60 >60 mL/min   GFR calc Af Amer >60 >60 mL/min   Anion gap 4 (L) 5 - 15  CBC with Differential  Result Value Ref Range   WBC 9.1 4.0 - 10.5 K/uL   RBC 4.30 4.22 - 5.81 MIL/uL   Hemoglobin 11.9 (L) 13.0 - 17.0 g/dL   HCT 29.536.8 (L) 28.439.0 - 13.252.0 %   MCV 85.6 78.0 - 100.0 fL   MCH 27.7 26.0 - 34.0 pg   MCHC 32.3 30.0 - 36.0 g/dL   RDW 44.014.5 10.211.5 - 72.515.5 %   Platelets 235 150 - 400 K/uL   Neutrophils Relative % 51 %   Neutro Abs 4.6  1.7 - 7.7 K/uL   Lymphocytes Relative 32 %   Lymphs Abs 2.9 0.7 - 4.0 K/uL   Monocytes Relative 10 %   Monocytes Absolute 0.9 0.1 - 1.0 K/uL   Eosinophils Relative 7 %   Eosinophils Absolute 0.6 0.0 - 0.7 K/uL   Basophils Relative 0 %   Basophils Absolute 0.0 0.0 - 0.1 K/uL  Urinalysis, Routine w reflex microscopic  Result Value Ref Range   Color, Urine YELLOW YELLOW   APPearance CLEAR CLEAR   Specific Gravity, Urine 1.034 (H) 1.005 - 1.030   pH 6.0 5.0 - 8.0   Glucose, UA NEGATIVE NEGATIVE mg/dL   Hgb urine dipstick NEGATIVE NEGATIVE   Bilirubin Urine SMALL (A) NEGATIVE   Ketones, ur 5 (A) NEGATIVE mg/dL   Protein, ur NEGATIVE NEGATIVE mg/dL   Nitrite NEGATIVE NEGATIVE   Leukocytes, UA NEGATIVE NEGATIVE   [place] Scrotum  Result Date: 05/25/2017 CLINICAL DATA:  31 year old male with right testicular pain and swelling. EXAM: SCROTAL ULTRASOUND DOPPLER ULTRASOUND OF THE TESTICLES TECHNIQUE: Complete ultrasound examination of the testicles, epididymis, and other scrotal structures was performed.  Color and spectral Doppler ultrasound were also utilized to evaluate blood flow to the testicles. COMPARISON:  None. FINDINGS: Right testicle Measurements: 4.6 x 2.7 x 2.9 cm. A 2 mm calcific focus noted in the midpole. The right testicle is slightly enlarged and heterogeneous and appears slightly hypervascular. Left testicle Measurements: 3.8 x 1.6 x 2.7 cm. No mass Villarreal microlithiasis visualized. Right epididymis: The right epididymis is enlarged and heterogeneous. There is increased flow to the right epididymis. The right epididymis measures 1.2 x 1.1 x 1.5 cm. Left epididymis: Normal in size and appearance. The left epididymis measures 0.4 x 0.2 x 0.6 cm. Hydrocele:  There is a small right hydrocele. Varicocele:  None visualized. Pulsed Doppler interrogation of both testes demonstrates normal low resistance arterial and venous waveforms bilaterally. IMPRESSION: 1. Findings most consistent with  right-sided epididym0-orchitis. Correlation with clinical exam and urinalysis recommended. An intermittent right testicular torsion is less likely. 2. Unremarkable left testicle. Electronically Signed   By: Elgie CollardArash  Radparvar M.D.   On: 05/25/2017 23:44   [place] Art/ven Flow Abd Pelv Doppler  Result Date: 05/25/2017 CLINICAL DATA:  31 year old male with right testicular pain and swelling. EXAM: SCROTAL ULTRASOUND DOPPLER ULTRASOUND OF THE TESTICLES TECHNIQUE: Complete ultrasound examination of the testicles, epididymis, and other scrotal structures was performed. Color and spectral Doppler ultrasound were also utilized to evaluate blood flow to the testicles. COMPARISON:  None. FINDINGS: Right testicle Measurements: 4.6 x 2.7 x 2.9 cm. A 2 mm calcific focus noted in the midpole. The right testicle is slightly enlarged and heterogeneous and appears slightly hypervascular. Left testicle Measurements: 3.8 x 1.6 x 2.7 cm. No mass Villarreal microlithiasis visualized. Right epididymis: The right epididymis is enlarged and heterogeneous. There is increased flow to the right epididymis. The right epididymis measures 1.2 x 1.1 x 1.5 cm. Left epididymis: Normal in size and appearance. The left epididymis measures 0.4 x 0.2 x 0.6 cm. Hydrocele:  There is a small right hydrocele. Varicocele:  None visualized. Pulsed Doppler interrogation of both testes demonstrates normal low resistance arterial and venous waveforms bilaterally. IMPRESSION: 1. Findings most consistent with right-sided epididym0-orchitis. Correlation with clinical exam and urinalysis recommended. An intermittent right testicular torsion is less likely. 2. Unremarkable left testicle. Electronically Signed   By: Elgie CollardArash  Radparvar M.D.   On: 05/25/2017 23:44     ED Course/Procedures   Clinical Course as of May 26 204  Thu May 25, 2017  2350 Plan: No evidence of testicular torsion.  Patient will be treated with doxycycline and Levaquin.  Urinalysis pending.  [HM]   Fri May 26, 2017  0205 Urinalysis without evidence of urinary tract infection.  Urine culture sent.  [HM]    Clinical Course User Index [HM] Valaree Fresquez, Boyd KerbsHannah, PA-C    Procedures  MDM    Vision with orchitis and epididymitis.  He will be treated with doxycycline and Levaquin.  Long discussion with patient about risks for infection with anal sex.  Discussed the importance of protection including condom usage.  Also discussed symptomatic therapy for pain control including anti-inflammatories and scrotal elevation.  Ultrasound shows calcified focus.  Patient will be referred to urology for further evaluation of this.  Gust with patient the importance of close follow-up and reasons to return to the emergency department.  He states understanding and is in agreement with this plan.   Orchitis and epididymitis        Milta DeitersMuthersbaugh, Olive Zmuda, PA-C 05/26/17 [id number]    Pricilla LovelessGoldston, Scott, MD 05/27/17 [phone]

## 2017-05-26 NOTE — Discharge Instructions (Addendum)
Please see the information and instructions below regarding your visit.  Your diagnoses today include:  1. Orchitis and epididymitis    Your ultrasound showed evidence of infection and your right epididymis and testicle.  This can be caused by sexually transmitted infection or transmission via anal intercourse.  We will treat you for all of this.  You have been tested for sexually transmitted infection today.  Should your testing come back positive, you will receive a call.  If it is negative you will not receive a call.  Tests performed today include: See side panel of your discharge paperwork for testing performed today. Vital signs are listed at the bottom of these instructions.   Ultrasound of the testicles Blood counts and electrolytes.  Medications prescribed:    Take any prescribed medications only as prescribed, and any over the counter medications only as directed on the packaging.  I spoke with the pharmacist and none of the antibiotics we are giving you will interact with your home medications.  You are prescribed ibuprofen, a non-steroidal anti-inflammatory agent (NSAID) for pain. You may take 600mg  every 6 hours as needed for pain. If still requiring this medication around the clock for acute pain after 10 days, please see your primary healthcare provider.  You may combine this medication with Tylenol, 650 mg every 6 hours, so you are receiving something for pain every 3 hours.  This is not a long-term medication unless under the care and direction of your primary provider. Taking this medication long-term and not under the supervision of a healthcare provider could increase the risk of stomach ulcers, kidney problems, and cardiovascular problems such as high blood pressure.    Home care instructions:  Please follow any educational materials contained in this packet.   Please elevate the scrotum when you are not ambulating.  Follow-up instructions: Please follow-up with  your primary care provider next week for further evaluation of your symptoms if they are not completely improved.    I am also referring you to urology to do a serial assessment of the calcified area on your right testicle.  Return instructions:  Please return to the Emergency Department if you experience worsening symptoms.  Please return to the emergency department for any fevers, chills, worsening swelling or redness of the scrotum, abdominal pain with your scrotal pain, or nausea or vomiting that is severe enough that you cannot keep anything down. Please return if you have any other emergent concerns.  Additional Information:   Your vital signs today were: BP 123/60 (BP Location: Right Arm)    Pulse 60    Temp 98 F (36.7 C) (Oral)    Resp 18    Ht 5' 8.5" (1.74 m)    Wt 61.2 kg (135 lb)    SpO2 100%    BMI 20.23 kg/m  If your blood pressure (BP) was elevated on multiple readings during this visit above 130 for the top number or above 80 for the bottom number, please have this repeated by your primary care provider within one month. --------------  Thank you for allowing us to participate in your care today.

## 2017-05-26 NOTE — ED Provider Notes (Cosign Needed)
[facility]-EMERGENCY DEPT Provider Note   CSN: [id number] Arrival date & time: 05/25/17  1926     History   Chief Complaint Chief Complaint  Patient presents with  . Groin Swelling    Rt side    HPI Cody Villarreal is a 31 y.o. male.  HPI   Patient is a 31 year old male with a history of [facility] ([facility]; last CD4 1112 in April of 2018, viral load undetectable; on Genvoya) and HIV-induced neuropathy presenting for right scrotal erythema and swelling.  Patient reports this is been occurring for the past 24 hours and is worsening.  Patient reports it is painful to sit on his scrotum.  Patient has never had a similar episode or history of testicular torsion.  Patient denies any fevers.  Patient reports he has daily night sweats, but this is chronic for him.  Patient denies any abdominal pain he has been nauseous without vomiting.  Patient has had the same male sexual partner for greater than 6 months.  Patient reports he performs anal intercourse.  No penile discharge.  Patient denies concern over STI exposure.  Past Medical History:  Diagnosis Date  . HIV disease (HCC)   . Neuropathy   . Patella-femoral syndrome     There are no active problems to display for this patient.   Past Surgical History:  Procedure Laterality Date  . BACK SURGERY         Home Medications    Prior to Admission medications   Medication Sig Start Date End Date Taking? Authorizing Provider  DULoxetine (CYMBALTA) 30 MG capsule Take 60 mg by mouth 3 (three) times daily.  05/31/15  Yes [provider]  GENVOYA 150-150-200-10 MG TABS tablet Take 1 tablet by mouth daily. 04/04/17  Yes [provider]  lamoTRIgine (LAMICTAL) 25 MG tablet Take 25 mg by mouth daily.  05/20/17  Yes [provider]  pregabalin (LYRICA) 75 MG capsule Take 75 mg by mouth 3 (three) times daily.   Yes [provider]  buprenorphine (BUTRANS - DOSED MCG/HR) 5 MCG/HR  PTWK patch Place 1 patch onto the skin every 7 (seven) days. 03/27/17   [provider]  cyclobenzaprine (FLEXERIL) 10 MG tablet Take 1 tablet (10 mg total) by mouth 2 (two) times daily as needed for muscle spasms. Patient not taking: Reported on 05/04/2017 02/15/17   McDonald, Pedro EarlsMia A, PA-C  dextromethorphan-guaiFENesin (MUCINEX DM) 30-600 MG 12hr tablet Take 1 tablet by mouth 2 (two) times daily. Patient not taking: Reported on 09/24/2015 06/13/15   Vanetta MuldersZackowski, Scott, MD  doxycycline (VIBRAMYCIN) 100 MG capsule Take 1 capsule (100 mg total) by mouth 2 (two) times daily. 05/26/17   Aviva KluverMurray, Abdulah Iqbal B, PA-C  gabapentin (NEURONTIN) 300 MG capsule Take 1 capsule (300 mg total) by mouth 3 (three) times daily. Take 300mg  at night on day 1, 300mg  twice a day on day 2, 300mg  three times on day 3 and every day thereafter until you see your primary doctor Patient not taking: Reported on 09/24/2015 02/11/15   Antony MaduraHumes, Kelly, PA-C  HYDROcodone-acetaminophen (NORCO) 5-325 MG tablet Take 1 tablet by mouth every 6 (six) hours as needed for severe pain. Patient not taking: Reported on 09/24/2015 07/08/15   Street, [place], PA-C  levofloxacin (LEVAQUIN) 500 MG tablet Take 1 tablet (500 mg total) by mouth daily. 05/26/17   Aviva KluverMurray, Cienna Dumais B, PA-C  metaxalone (SKELAXIN) 800 MG tablet Take 800 mg by mouth 3 (three) times daily. 02/24/16  [provider]  oxyCODONE-acetaminophen (PERCOCET/ROXICET) 5-325 MG tablet Take 2 tablets by mouth every 4 (four) hours as needed for severe pain. Patient not taking: Reported on 09/24/2015 06/20/15   Margarita Grizzle, MD    Family History No family history on file.  Social History Social History   Tobacco Use  . Smoking status: Current Every Day Smoker    Packs/day: 0.50    Types: Cigarettes  . Smokeless tobacco: Never Used  Substance Use Topics  . Alcohol use: No  . Drug use: No     Allergies   Patient has no known allergies.   Review of Systems Review of Systems   Constitutional: Positive for chills. Negative for fever.  HENT: Negative for congestion and rhinorrhea.   Respiratory: Negative for cough and shortness of breath.   Cardiovascular: Negative for chest pain.  Gastrointestinal: Positive for nausea. Negative for abdominal pain and vomiting.       No rectal pain.  Genitourinary: Positive for scrotal swelling and testicular pain. Negative for discharge, dysuria, flank pain, genital sores, penile pain and penile swelling.  Musculoskeletal: Negative for arthralgias.  Skin: Positive for color change.  All other systems reviewed and are negative.    Physical Exam Updated Vital Signs BP 123/60 (BP Location: Right Arm)   Pulse 60   Temp 98 F (36.7 C) (Oral)   Resp 18   Ht 5' 8.5" (1.74 m)   Wt 61.2 kg (135 lb)   SpO2 100%   BMI 20.23 kg/m   Physical Exam  Constitutional: He appears well-developed and well-nourished. No distress.  HENT:  Head: Normocephalic and atraumatic.  Mouth/Throat: Oropharynx is clear and moist.  Eyes: Conjunctivae and EOM are normal. Pupils are equal, round, and reactive to light.  Neck: Normal range of motion. Neck supple.  Cardiovascular: Normal rate, regular rhythm, S1 normal and S2 normal.  No murmur heard. Pulmonary/Chest: Effort normal and breath sounds normal. He has no wheezes. He has no rales.  Abdominal: Soft. He exhibits no distension. There is no tenderness. There is no guarding.  Genitourinary:  Genitourinary Comments: Exam performed with nurse tech chaperone present.  There is erythema and swelling to the right scrotum.  Left scrotum without erythema or swelling.  There are no lesions of the penile shaft.  There are no inguinal lesions or inguinal lymphadenopathy.  Right scrotum especially tender to palpation.  Cremasteric reflex is intact bilaterally.  No lesions, external hemorrhoids, or fissures of anus.  There is no tenderness to palpation of rectal or anal mucosa.  Musculoskeletal: Normal  range of motion. He exhibits no edema or deformity.  Lymphadenopathy:    He has no cervical adenopathy.  Neurological: He is alert.  Cranial nerves grossly intact. Patient was extremities symmetrically and with good coordination.  Skin: Skin is warm and dry. No rash noted. No erythema.  Psychiatric: He has a normal mood and affect. His behavior is normal. Judgment and thought content normal.  Nursing note and vitals reviewed.    ED Treatments / Results  Labs (all labs ordered are listed, but only abnormal results are displayed) Labs Reviewed  BASIC METABOLIC PANEL - Abnormal; Notable for the following components:      Result Value   Glucose, Bld 101 (*)    Calcium 8.8 (*)    Anion gap 4 (*)    All other components within normal limits  CBC WITH DIFFERENTIAL/PLATELET - Abnormal; Notable for the following components:   Hemoglobin 11.9 (*)    HCT  36.8 (*)    All other components within normal limits  URINE CULTURE  URINALYSIS, ROUTINE W REFLEX MICROSCOPIC  GC/CHLAMYDIA PROBE AMP ([place]) NOT AT [facility]    EKG  EKG Interpretation None       Radiology [place] Scrotum  Result Date: 05/25/2017 CLINICAL DATA:  31 year old male with right testicular pain and swelling. EXAM: SCROTAL ULTRASOUND DOPPLER ULTRASOUND OF THE TESTICLES TECHNIQUE: Complete ultrasound examination of the testicles, epididymis, and other scrotal structures was performed. Color and spectral Doppler ultrasound were also utilized to evaluate blood flow to the testicles. COMPARISON:  None. FINDINGS: Right testicle Measurements: 4.6 x 2.7 x 2.9 cm. A 2 mm calcific focus noted in the midpole. The right testicle is slightly enlarged and heterogeneous and appears slightly hypervascular. Left testicle Measurements: 3.8 x 1.6 x 2.7 cm. No mass or microlithiasis visualized. Right epididymis: The right epididymis is enlarged and heterogeneous. There is increased flow to the right epididymis. The right epididymis measures 1.2 x  1.1 x 1.5 cm. Left epididymis: Normal in size and appearance. The left epididymis measures 0.4 x 0.2 x 0.6 cm. Hydrocele:  There is a small right hydrocele. Varicocele:  None visualized. Pulsed Doppler interrogation of both testes demonstrates normal low resistance arterial and venous waveforms bilaterally. IMPRESSION: 1. Findings most consistent with right-sided epididym0-orchitis. Correlation with clinical exam and urinalysis recommended. An intermittent right testicular torsion is less likely. 2. Unremarkable left testicle. Electronically Signed   By: Elgie CollardArash  Radparvar M.D.   On: 05/25/2017 23:44   [place] Art/ven Flow Abd Pelv Doppler  Result Date: 05/25/2017 CLINICAL DATA:  31 year old male with right testicular pain and swelling. EXAM: SCROTAL ULTRASOUND DOPPLER ULTRASOUND OF THE TESTICLES TECHNIQUE: Complete ultrasound examination of the testicles, epididymis, and other scrotal structures was performed. Color and spectral Doppler ultrasound were also utilized to evaluate blood flow to the testicles. COMPARISON:  None. FINDINGS: Right testicle Measurements: 4.6 x 2.7 x 2.9 cm. A 2 mm calcific focus noted in the midpole. The right testicle is slightly enlarged and heterogeneous and appears slightly hypervascular. Left testicle Measurements: 3.8 x 1.6 x 2.7 cm. No mass or microlithiasis visualized. Right epididymis: The right epididymis is enlarged and heterogeneous. There is increased flow to the right epididymis. The right epididymis measures 1.2 x 1.1 x 1.5 cm. Left epididymis: Normal in size and appearance. The left epididymis measures 0.4 x 0.2 x 0.6 cm. Hydrocele:  There is a small right hydrocele. Varicocele:  None visualized. Pulsed Doppler interrogation of both testes demonstrates normal low resistance arterial and venous waveforms bilaterally. IMPRESSION: 1. Findings most consistent with right-sided epididym0-orchitis. Correlation with clinical exam and urinalysis recommended. An intermittent right  testicular torsion is less likely. 2. Unremarkable left testicle. Electronically Signed   By: Elgie CollardArash  Radparvar M.D.   On: 05/25/2017 23:44    Procedures Procedures (including critical care time)  Medications Ordered in ED Medications  sodium chloride 0.9 % bolus 1,000 mL (1,000 mLs Intravenous New Bag/Given 05/25/17 2323)  morphine 4 MG/ML injection 4 mg (4 mg Intravenous Given 05/25/17 2324)  cefTRIAXone (ROCEPHIN) injection 250 mg (250 mg Intramuscular Given 05/26/17 0019)  ketorolac (TORADOL) 30 MG/ML injection 30 mg (30 mg Intravenous Given 05/26/17 0018)  sterile water (preservative free) injection (10 mLs  Given 05/26/17 0018)     Initial Impression / Assessment and Plan / ED Course  I have reviewed the triage vital signs and the nursing notes.  Pertinent labs & imaging results that were available during my care of the  patient were reviewed by me and considered in my medical decision making (see chart for details).      Final Clinical Impressions(s) / ED Diagnoses   Final diagnoses:  Orchitis and epididymitis   Patient is nontoxic-appearing, afebrile, in no acute distress.  Abdominal examination is benign.  Patient with exquisitely tender right scrotum.  Differential diagnosis includes epididymitis, orchitis, or testicular torsion.  No evidence of Fournier's gangrene or sepsis.  Patient verbally verified a safe ride from the ED. Proceeded with prescribing morphine for pain in the ED.  Laboratory analysis demonstrates no leukocytosis.  Urine culture in process and to be followed by Dierdre Forth, PA-C.  Scrotal ultrasound demonstrates no evidence of torsion, but notes increased blood flow to the right testis consistent with epididymal orchitis.  Ultrasound also notes a 2 mm calcific focus in the midpole of the right testicle will give patient urology follow-up regarding this finding.  Patient given Rocephin IM in emergency department today.  Patient to finish doxycycline  10-day course.  Additionally, as patient performs penetrative anal intercourse, patient also did be covered with 10 days of 500 mg Levaquin daily.  I discussed patient's medication regimen with a pharmacist and no interactions found.  Patient to follow-up with his primary care provider next week for recheck.  Patient given return precautions for any increasing scrotal swelling or redness, fever, chills, abdominal pain, or intractable nausea or vomiting.  Patient is understanding and agrees with plan of care.   ED Discharge Orders        Ordered    doxycycline (VIBRAMYCIN) 100 MG capsule  2 times daily     05/26/17 0017    levofloxacin (LEVAQUIN) 500 MG tablet  Daily     05/26/17 0017       Elisha Ponder, [place] 05/26/17 [phone]

## 2017-05-27 LAB — URINE CULTURE: Culture: NO GROWTH

## 2017-05-29 LAB — GC/CHLAMYDIA PROBE AMP (~~LOC~~) NOT AT ARMC
Chlamydia: NEGATIVE
Neisseria Gonorrhea: NEGATIVE

## 2017-09-27 MED ORDER — ELVITEG 150 MG-COB 150 MG-EMTRICIT 200 MG-TENOFO ALAFENAM 10 MG TABLET
ORAL_TABLET | Freq: Every day | ORAL | 0 refills | 0 days | Status: CP
Start: 2017-09-27 — End: 2017-10-18

## 2017-10-18 ENCOUNTER — Ambulatory Visit: Admit: 2017-10-18 | Discharge: 2017-10-19 | Payer: BLUE CROSS/BLUE SHIELD

## 2017-10-18 ENCOUNTER — Encounter: Admit: 2017-10-18 | Discharge: 2017-10-19 | Payer: BLUE CROSS/BLUE SHIELD

## 2017-10-18 DIAGNOSIS — G63 Polyneuropathy in diseases classified elsewhere: Secondary | ICD-10-CM

## 2017-10-18 DIAGNOSIS — M4722 Other spondylosis with radiculopathy, cervical region: Secondary | ICD-10-CM

## 2017-10-18 DIAGNOSIS — K068 Other specified disorders of gingiva and edentulous alveolar ridge: Secondary | ICD-10-CM

## 2017-10-18 DIAGNOSIS — B20 Human immunodeficiency virus [HIV] disease: Principal | ICD-10-CM

## 2017-10-18 MED ORDER — ELVITEG 150 MG-COB 150 MG-EMTRICIT 200 MG-TENOFO ALAFENAM 10 MG TABLET
ORAL_TABLET | Freq: Every day | ORAL | 11 refills | 0.00000 days | Status: CP
Start: 2017-10-18 — End: 2018-11-02

## 2017-12-14 MED ORDER — LAMOTRIGINE 100 MG TABLET
ORAL_TABLET | Freq: Every day | ORAL | 0 refills | 0 days | Status: CP
Start: 2017-12-14 — End: 2017-12-14

## 2017-12-14 MED ORDER — DULOXETINE 30 MG CAPSULE,DELAYED RELEASE
ORAL_CAPSULE | Freq: Two times a day (BID) | ORAL | 2 refills | 0 days | Status: CP
Start: 2017-12-14 — End: 2017-12-14

## 2017-12-14 MED ORDER — LAMOTRIGINE 100 MG TABLET: 100 mg | tablet | 0 refills | 0 days | Status: AC

## 2017-12-14 MED ORDER — DULOXETINE 30 MG CAPSULE,DELAYED RELEASE: 30 mg | capsule | 2 refills | 0 days | Status: AC

## 2018-10-19 ENCOUNTER — Encounter: Admit: 2018-10-19 | Discharge: 2018-10-19 | Payer: BLUE CROSS/BLUE SHIELD

## 2018-10-19 ENCOUNTER — Other Ambulatory Visit: Admit: 2018-10-19 | Discharge: 2018-10-19 | Payer: BLUE CROSS/BLUE SHIELD

## 2018-10-19 DIAGNOSIS — B079 Viral wart, unspecified: Secondary | ICD-10-CM

## 2018-10-19 DIAGNOSIS — B20 Human immunodeficiency virus [HIV] disease: Principal | ICD-10-CM

## 2018-10-19 DIAGNOSIS — F17200 Nicotine dependence, unspecified, uncomplicated: Secondary | ICD-10-CM

## 2018-11-02 MED ORDER — GENVOYA 150 MG-150 MG-200 MG-10 MG TABLET
ORAL_TABLET | 11 refills | 0 days | Status: CP
Start: 2018-11-02 — End: ?

## 2019-09-05 ENCOUNTER — Emergency Department (HOSPITAL_COMMUNITY)
Admission: EM | Admit: 2019-09-05 | Discharge: 2019-09-05 | Discharge status: Against Medical Advice | Payer: Medicaid Other

## 2019-10-17 DIAGNOSIS — B20 Human immunodeficiency virus [HIV] disease: Principal | ICD-10-CM

## 2019-10-17 MED ORDER — GENVOYA 150 MG-150 MG-200 MG-10 MG TABLET
ORAL_TABLET | 0 refills | 0 days | Status: CP
Start: 2019-10-17 — End: ?

## 2019-11-01 ENCOUNTER — Encounter: Admit: 2019-11-01 | Discharge: 2019-11-01 | Payer: BLUE CROSS/BLUE SHIELD

## 2019-11-01 DIAGNOSIS — Z720 Tobacco use: Principal | ICD-10-CM

## 2019-11-01 DIAGNOSIS — B079 Viral wart, unspecified: Principal | ICD-10-CM

## 2019-11-01 DIAGNOSIS — B20 Human immunodeficiency virus [HIV] disease: Principal | ICD-10-CM

## 2019-11-05 ENCOUNTER — Ambulatory Visit: Admit: 2019-11-05 | Discharge: 2019-11-06 | Payer: BLUE CROSS/BLUE SHIELD

## 2019-11-05 DIAGNOSIS — B20 Human immunodeficiency virus [HIV] disease: Principal | ICD-10-CM

## 2019-11-05 DIAGNOSIS — Z720 Tobacco use: Principal | ICD-10-CM

## 2019-11-05 MED ORDER — CHANTIX STARTING MONTH BOX 0.5 MG (11)-1 MG (42) TABLETS IN DOSE PACK
ORAL_TABLET | ORAL | 0 refills | 0.00000 days | Status: CP
Start: 2019-11-05 — End: ?

## 2019-11-05 MED ORDER — VARENICLINE 1 MG TABLET
ORAL_TABLET | Freq: Two times a day (BID) | ORAL | 1 refills | 28.00000 days | Status: CP
Start: 2019-11-05 — End: ?

## 2020-01-17 DIAGNOSIS — B20 Human immunodeficiency virus [HIV] disease: Principal | ICD-10-CM

## 2020-01-21 MED ORDER — GENVOYA 150 MG-150 MG-200 MG-10 MG TABLET
ORAL_TABLET | Freq: Every day | ORAL | 1 refills | 90.00000 days | Status: CP
Start: 2020-01-21 — End: ?

## 2020-04-22 ENCOUNTER — Ambulatory Visit: Admit: 2020-04-22 | Discharge: 2020-04-22 | Payer: BLUE CROSS/BLUE SHIELD

## 2020-04-22 DIAGNOSIS — B20 Human immunodeficiency virus [HIV] disease: Principal | ICD-10-CM

## 2020-04-22 DIAGNOSIS — R590 Localized enlarged lymph nodes: Principal | ICD-10-CM

## 2020-04-22 DIAGNOSIS — Z720 Tobacco use: Principal | ICD-10-CM

## 2020-04-22 DIAGNOSIS — R070 Pain in throat: Principal | ICD-10-CM

## 2020-04-22 DIAGNOSIS — Z21 Asymptomatic human immunodeficiency virus [HIV] infection status: Principal | ICD-10-CM

## 2020-04-22 DIAGNOSIS — R21 Rash and other nonspecific skin eruption: Principal | ICD-10-CM

## 2020-04-22 MED ORDER — TRIAMCINOLONE ACETONIDE 0.1 % TOPICAL CREAM
Freq: Two times a day (BID) | TOPICAL | 1 refills | 0 days | Status: CP
Start: 2020-04-22 — End: 2021-04-22

## 2020-05-07 DIAGNOSIS — F17210 Nicotine dependence, cigarettes, uncomplicated: Secondary | ICD-10-CM | POA: Insufficient documentation

## 2020-05-07 DIAGNOSIS — K219 Gastro-esophageal reflux disease without esophagitis: Secondary | ICD-10-CM | POA: Insufficient documentation

## 2020-05-15 DIAGNOSIS — R0989 Other specified symptoms and signs involving the circulatory and respiratory systems: Secondary | ICD-10-CM | POA: Insufficient documentation

## 2020-07-24 DIAGNOSIS — B20 Human immunodeficiency virus [HIV] disease: Principal | ICD-10-CM

## 2020-07-24 MED ORDER — GENVOYA 150 MG-150 MG-200 MG-10 MG TABLET
ORAL_TABLET | Freq: Every day | ORAL | 1 refills | 90 days | Status: CP
Start: 2020-07-24 — End: ?

## 2020-08-21 ENCOUNTER — Encounter (HOSPITAL_COMMUNITY): Payer: Self-pay

## 2020-08-21 ENCOUNTER — Ambulatory Visit (INDEPENDENT_AMBULATORY_CARE_PROVIDER_SITE_OTHER): Payer: Medicaid Other

## 2020-08-21 ENCOUNTER — Ambulatory Visit (HOSPITAL_COMMUNITY)
Admission: EM | Admit: 2020-08-21 | Discharge: 2020-08-21 | Disposition: A | Discharge status: Home / Self Care | Payer: Medicaid Other | Attending: Medical Oncology | Admitting: Medical Oncology

## 2020-08-21 ENCOUNTER — Other Ambulatory Visit: Payer: Self-pay

## 2020-08-21 DIAGNOSIS — M79672 Pain in left foot: Secondary | ICD-10-CM

## 2020-08-21 DIAGNOSIS — M79675 Pain in left toe(s): Secondary | ICD-10-CM

## 2020-08-21 MED ORDER — IBUPROFEN 600 MG PO TABS: 600.0000 mg | ORAL_TABLET | Freq: Four times a day (QID) | ORAL | 0 refills | Status: DC | PRN

## 2020-08-21 NOTE — ED Provider Notes (Signed)
[facility]    CSN: [id number] Arrival date & time: 08/21/20  1507      History   Chief Complaint Chief Complaint  Patient presents with  . Leg Pain    left    HPI Cody Villarreal is a 35 y.o. male.   HPI   Foot Pain: Patient reports left foot pain for the past 3 weeks.  Pain is progressively worsening.  No known injury.  He states that the pain is at the base of the second and third digits.  Hurts when he completes range of motion exercises or walks on this foot.  He has pain both at the top and bottom of the foot in this area.  Initially he states that he thought that he pinched his nerve in this area but Tylenol and ibuprofen have not been helping.  Of note he does have a history of neuropathy and HIV disease.   Past Medical History:  Diagnosis Date  . HIV disease (HCC)   . Neuropathy   . Patella-femoral syndrome     There are no problems to display for this patient.   Past Surgical History:  Procedure Laterality Date  . BACK SURGERY        Home Medications    Prior to Admission medications   Medication Sig Start Date End Date Taking? Authorizing Provider  buprenorphine (BUTRANS - DOSED MCG/HR) 5 MCG/HR PTWK patch Place 1 patch onto the skin every 7 (seven) days. 03/27/17   [provider]  cyclobenzaprine (FLEXERIL) 10 MG tablet Take 1 tablet (10 mg total) by mouth 2 (two) times daily as needed for muscle spasms. Patient not taking: Reported on 05/04/2017 02/15/17   McDonald, Pedro Earls A, PA-C  dextromethorphan-guaiFENesin (MUCINEX DM) 30-600 MG 12hr tablet Take 1 tablet by mouth 2 (two) times daily. Patient not taking: Reported on 09/24/2015 06/13/15   Vanetta Mulders, MD  doxycycline (VIBRAMYCIN) 100 MG capsule Take 1 capsule (100 mg total) by mouth 2 (two) times daily. 05/26/17   Aviva Kluver B, PA-C  DULoxetine (CYMBALTA) 30 MG capsule Take 60 mg by mouth 3 (three) times daily.  05/31/15   [provider]  gabapentin (NEURONTIN) 300 MG  capsule Take 1 capsule (300 mg total) by mouth 3 (three) times daily. Take 300mg  at night on day 1, 300mg  twice a day on day 2, 300mg  three times on day 3 and every day thereafter until you see your primary doctor Patient not taking: Reported on 09/24/2015 02/11/15   , PA-C  GENVOYA 150-150-200-10 MG TABS tablet Take 1 tablet by mouth daily. 04/04/17   [provider]  HYDROcodone-acetaminophen (NORCO) 5-325 MG tablet Take 1 tablet by mouth every 6 (six) hours as needed for severe pain. Patient not taking: Reported on 09/24/2015 07/08/15   Street, [place], PA-C  lamoTRIgine (LAMICTAL) 25 MG tablet Take 25 mg by mouth daily.  05/20/17   [provider]  levofloxacin (LEVAQUIN) 500 MG tablet Take 1 tablet (500 mg total) by mouth daily. 05/26/17   Franklinton B, PA-C  metaxalone (SKELAXIN) 800 MG tablet Take 800 mg by mouth 3 (three) times daily. 02/24/16   [provider]  oxyCODONE-acetaminophen (PERCOCET/ROXICET) 5-325 MG tablet Take 2 tablets by mouth every 4 (four) hours as needed for severe pain. Patient not taking: Reported on 09/24/2015 06/20/15   02/26/16, MD  pregabalin (LYRICA) 75 MG capsule Take 75 mg by mouth 3 (three) times daily.    [provider]  ipratropium (  ATROVENT) 0.06 % nasal spray Place 2 sprays into both nostrils 4 (four) times daily. Patient not taking: Reported on 05/26/2014 01/15/14 06/03/14  Reuben Likes, MD    Family History History reviewed. No pertinent family history.  Social History Social History   Tobacco Use  . Smoking status: Current Every Day Smoker    Packs/day: 0.50    Types: Cigarettes  . Smokeless tobacco: Never Used  Substance Use Topics  . Alcohol use: No  . Drug use: No     Allergies   Amoxicillin   Review of Systems Review of Systems  As stated above in HPI Physical Exam Triage Vital Signs ED Triage Vitals  Enc Vitals Group     BP 08/21/20 1545 111/71     Pulse Rate 08/21/20 1545  100     Resp --      Temp 08/21/20 1545 98.4 F (36.9 C)     Temp Source 08/21/20 1545 Oral     SpO2 08/21/20 1545 100 %     Weight --      Height --      Head Circumference --      Peak Flow --      Pain Score 08/21/20 1543 8     Pain Loc --      Pain Edu? --      Excl. in GC? --    No data found.  Updated Vital Signs BP 111/71 (BP Location: Right Arm)   Pulse 100   Temp 98.4 F (36.9 C) (Oral)   SpO2 100%   Physical Exam Vitals and nursing note reviewed.  Constitutional:      General: He is not in acute distress.    Appearance: Normal appearance. He is not ill-appearing, toxic-appearing or diaphoretic.  Cardiovascular:     Pulses: Normal pulses.  Musculoskeletal:        General: Tenderness (Base of the 2nd and 3rd toes) present. No swelling, deformity or signs of injury.     Right lower leg: No edema.     Left lower leg: No edema.     Comments: ROM of the left 2nd and 3rd toe reduced by about 50% due to pain  Skin:    General: Skin is warm.     Capillary Refill: Capillary refill takes less than 2 seconds.     Coloration: Skin is not jaundiced or pale.     Findings: No bruising, erythema, lesion or rash.  Neurological:     General: No focal deficit present.     Mental Status: He is alert and oriented to person, place, and time.     Gait: Gait abnormal (slightly limp of the left foot).      UC Treatments / Results  Labs (all labs ordered are listed, but only abnormal results are displayed) Labs Reviewed - No data to display  EKG   Radiology DG Foot Complete Left  Result Date: 08/21/2020 CLINICAL DATA:  Left foot pain, base of second third toes EXAM: LEFT FOOT - COMPLETE 3+ VIEW COMPARISON:  None. FINDINGS: There is no evidence of fracture or dislocation. There is no evidence of arthropathy or other focal bone abnormality. Soft tissues are unremarkable. IMPRESSION: No fracture or dislocation of the left foot. Joint spaces are well preserved. Electronically  Signed   By: Lauralyn Primes M.D.   On: 08/21/2020 17:18    Procedures Procedures (including critical care time)  Medications Ordered in UC Medications - No data to display  Initial  Impression / Assessment and Plan / UC Course  I have reviewed the triage vital signs and the nursing notes.  Pertinent labs & imaging results that were available during my care of the patient were reviewed by me and considered in my medical decision making (see chart for details).     New. Given length of illness, HIV complication, progressive symptoms and physical examination findings I am going to recommend an x ray for further evaluation. If negative for abnormality I would recommend Ibuprofen given the increased risk of nephrotoxicity with his HIV medication and orthopedic referral. It appears that he is already taking medication for any neuropathy involvement but should continue his follow up.  Final Clinical Impressions(s) / UC Diagnoses   Final diagnoses:  None   Discharge Instructions   None    ED Prescriptions    None     PDMP not reviewed this encounter.   Rushie Chestnut, [place] 08/21/20 1735

## 2020-08-21 NOTE — ED Triage Notes (Signed)
Pt presents with left foot pain and toe swelling X 3 weeks. Pt states he did not injur himself. He states he thought it was a pinched nerve. Pt states the pain has been progressing.

## 2020-08-28 DIAGNOSIS — R768 Other specified abnormal immunological findings in serum: Secondary | ICD-10-CM | POA: Insufficient documentation

## 2020-10-07 ENCOUNTER — Ambulatory Visit: Admit: 2020-10-07 | Discharge: 2020-10-07 | Payer: BLUE CROSS/BLUE SHIELD

## 2020-10-07 DIAGNOSIS — Z720 Tobacco use: Principal | ICD-10-CM

## 2020-10-07 DIAGNOSIS — B20 Human immunodeficiency virus [HIV] disease: Principal | ICD-10-CM

## 2020-10-07 DIAGNOSIS — R21 Rash and other nonspecific skin eruption: Principal | ICD-10-CM

## 2020-10-07 DIAGNOSIS — R070 Pain in throat: Principal | ICD-10-CM

## 2020-10-07 DIAGNOSIS — R634 Abnormal weight loss: Principal | ICD-10-CM

## 2020-10-27 ENCOUNTER — Ambulatory Visit: Admit: 2020-10-27 | Payer: BLUE CROSS/BLUE SHIELD

## 2020-11-11 ENCOUNTER — Ambulatory Visit: Admit: 2020-11-11 | Payer: BLUE CROSS/BLUE SHIELD

## 2020-11-25 ENCOUNTER — Institutional Professional Consult (permissible substitution): Admit: 2020-11-25 | Discharge: 2020-11-26 | Payer: BLUE CROSS/BLUE SHIELD

## 2020-11-25 DIAGNOSIS — Z202 Contact with and (suspected) exposure to infections with a predominantly sexual mode of transmission: Principal | ICD-10-CM

## 2020-11-25 MED ORDER — DOXYCYCLINE MONOHYDRATE 100 MG CAPSULE
ORAL_CAPSULE | Freq: Two times a day (BID) | ORAL | 0 refills | 14.00000 days | Status: CP
Start: 2020-11-25 — End: 2020-12-09

## 2020-12-14 ENCOUNTER — Ambulatory Visit: Admit: 2020-12-14 | Payer: BLUE CROSS/BLUE SHIELD

## 2021-01-18 DIAGNOSIS — B20 Human immunodeficiency virus [HIV] disease: Principal | ICD-10-CM

## 2021-01-18 MED ORDER — GENVOYA 150 MG-150 MG-200 MG-10 MG TABLET
ORAL_TABLET | Freq: Every day | ORAL | 1 refills | 90.00000 days | Status: CP
Start: 2021-01-18 — End: ?

## 2021-03-21 ENCOUNTER — Emergency Department (HOSPITAL_COMMUNITY)
Admission: EM | Admit: 2021-03-21 | Discharge: 2021-03-21 | Disposition: A | Discharge status: Home / Self Care | Payer: Medicaid Other | Attending: Emergency Medicine | Admitting: Emergency Medicine

## 2021-03-21 ENCOUNTER — Encounter (HOSPITAL_COMMUNITY): Payer: Self-pay | Admitting: Emergency Medicine

## 2021-03-21 ENCOUNTER — Other Ambulatory Visit: Payer: Self-pay

## 2021-03-21 ENCOUNTER — Ambulatory Visit (HOSPITAL_COMMUNITY)
Admission: EM | Admit: 2021-03-21 | Discharge: 2021-03-21 | Disposition: A | Discharge status: Home / Self Care | Payer: Medicaid Other | Attending: Physician Assistant | Admitting: Physician Assistant

## 2021-03-21 DIAGNOSIS — Z5321 Procedure and treatment not carried out due to patient leaving prior to being seen by health care provider: Secondary | ICD-10-CM | POA: Insufficient documentation

## 2021-03-21 DIAGNOSIS — R109 Unspecified abdominal pain: Secondary | ICD-10-CM | POA: Diagnosis not present

## 2021-03-21 DIAGNOSIS — G8929 Other chronic pain: Secondary | ICD-10-CM | POA: Insufficient documentation

## 2021-03-21 DIAGNOSIS — Z21 Asymptomatic human immunodeficiency virus [HIV] infection status: Secondary | ICD-10-CM | POA: Insufficient documentation

## 2021-03-21 DIAGNOSIS — Z8616 Personal history of COVID-19: Secondary | ICD-10-CM | POA: Insufficient documentation

## 2021-03-21 DIAGNOSIS — R112 Nausea with vomiting, unspecified: Secondary | ICD-10-CM | POA: Insufficient documentation

## 2021-03-21 LAB — CBC WITH DIFFERENTIAL/PLATELET
Abs Immature Granulocytes: 0.06 10*3/uL (ref 0.00–0.07)
Basophils Absolute: 0 10*3/uL (ref 0.0–0.1)
Basophils Relative: 0 %
Eosinophils Absolute: 0 10*3/uL (ref 0.0–0.5)
Eosinophils Relative: 0 %
HCT: 39.6 % (ref 39.0–52.0)
Hemoglobin: 13 g/dL (ref 13.0–17.0)
Immature Granulocytes: 0 %
Lymphocytes Relative: 16 %
Lymphs Abs: 2.3 10*3/uL (ref 0.7–4.0)
MCH: 27 pg (ref 26.0–34.0)
MCHC: 32.8 g/dL (ref 30.0–36.0)
MCV: 82.3 fL (ref 80.0–100.0)
Monocytes Absolute: 1.5 10*3/uL — ABNORMAL HIGH (ref 0.1–1.0)
Monocytes Relative: 11 %
Neutro Abs: 10.5 10*3/uL — ABNORMAL HIGH (ref 1.7–7.7)
Neutrophils Relative %: 73 %
Platelets: 427 10*3/uL — ABNORMAL HIGH (ref 150–400)
RBC: 4.81 MIL/uL (ref 4.22–5.81)
RDW: 14.2 % (ref 11.5–15.5)
WBC: 14.4 10*3/uL — ABNORMAL HIGH (ref 4.0–10.5)
nRBC: 0 % (ref 0.0–0.2)

## 2021-03-21 LAB — POCT URINALYSIS DIPSTICK, ED / UC
Glucose, UA: NEGATIVE mg/dL
Hgb urine dipstick: NEGATIVE
Leukocytes,Ua: NEGATIVE
Nitrite: NEGATIVE
Protein, ur: 30 mg/dL — AB
Specific Gravity, Urine: 1.02 (ref 1.005–1.030)
Urobilinogen, UA: 1 mg/dL (ref 0.0–1.0)
pH: 7 (ref 5.0–8.0)

## 2021-03-21 LAB — COMPREHENSIVE METABOLIC PANEL
ALT: 10 U/L (ref 0–44)
AST: 15 U/L (ref 15–41)
Albumin: 3.2 g/dL — ABNORMAL LOW (ref 3.5–5.0)
Alkaline Phosphatase: 63 U/L (ref 38–126)
Anion gap: 11 (ref 5–15)
BUN: 12 mg/dL (ref 6–20)
CO2: 28 mmol/L (ref 22–32)
Calcium: 9.1 mg/dL (ref 8.9–10.3)
Chloride: 100 mmol/L (ref 98–111)
Creatinine, Ser: 0.9 mg/dL (ref 0.61–1.24)
GFR, Estimated: >60 mL/min (ref >60)
Glucose, Bld: 95 mg/dL (ref 70–99)
Potassium: 3.3 mmol/L — ABNORMAL LOW (ref 3.5–5.1)
Sodium: 139 mmol/L (ref 135–145)
Total Bilirubin: 0.5 mg/dL (ref 0.3–1.2)
Total Protein: 7.2 g/dL (ref 6.5–8.1)

## 2021-03-21 MED ORDER — ONDANSETRON 4 MG PO TBDP
ORAL_TABLET | ORAL | Status: AC
  Filled 2021-03-21: qty 1

## 2021-03-21 MED ORDER — ONDANSETRON 4 MG PO TBDP: 4.0000 mg | ORAL_TABLET | Freq: Once | ORAL | Status: DC

## 2021-03-21 MED ORDER — ONDANSETRON 4 MG PO TBDP
4.0000 mg | ORAL_TABLET | Freq: Once | ORAL | Status: AC
Start: 2021-03-21 — End: 2021-03-21
  Administered 2021-03-21: 4 mg via ORAL

## 2021-03-21 MED ORDER — ONDANSETRON 4 MG PO TBDP: 4.0000 mg | ORAL_TABLET | Freq: Three times a day (TID) | ORAL | 0 refills | Status: DC | PRN

## 2021-03-21 NOTE — ED Notes (Signed)
Do not see pt in lobby to get vitals

## 2021-03-21 NOTE — ED Provider Notes (Signed)
[facility]    CSN: [id number] Arrival date & time: 03/21/21  1510      History   Chief Complaint Chief Complaint  Patient presents with   Emesis   Fatigue   Medication Reaction    HPI Cody Villarreal is a 35 y.o. male.   Patient presents today with a 3-day history of nausea and vomiting..  Reports that symptoms began soon after he was prescribed prednisone for increased pain due to neuropathy by his pain management clinic.  He denies any history of diabetes.  Denies any abdominal pain, fever, cough, congestion, chest pain, shortness of breath, changes in bowel habits, diarrhea.  Reports last episode of emesis was several hours ago and was described as stomach contents without hematemesis.  He has not tried any over-the-counter medications for symptom management.  Denies history of gastrointestinal disorder and has not seen GI specialist in the past.  He has been having difficulty keeping fluids down today prompting evaluation.  He initially went to the emergency room and had lab work that was obtained but did not stay for further evaluation.  CBC was normal with exception of leukocytosis consistent with recent prednisone use and CMP showed mild hypokalemia otherwise normal.  No imaging was obtained.  He denies any recent antibiotic use, medication changes, recent travel.  Denies previous abdominal surgeries.   Past Medical History:  Diagnosis Date   HIV disease (HCC)    Neuropathy    Patella-femoral syndrome     There are no problems to display for this patient.   Past Surgical History:  Procedure Laterality Date   BACK SURGERY         Home Medications    Prior to Admission medications   Medication Sig Start Date End Date Taking? Authorizing Provider  ondansetron (ZOFRAN ODT) 4 MG disintegrating tablet Take 1 tablet (4 mg total) by mouth every 8 (eight) hours as needed for nausea or vomiting. 03/21/21  Yes Adryan Shin K, PA-C  buprenorphine (BUTRANS -  DOSED MCG/HR) 5 MCG/HR PTWK patch Place 1 patch onto the skin every 7 (seven) days. 03/27/17   [provider]  doxycycline (VIBRAMYCIN) 100 MG capsule Take 1 capsule (100 mg total) by mouth 2 (two) times daily. 05/26/17   Aviva Kluver B, PA-C  DULoxetine (CYMBALTA) 30 MG capsule Take 60 mg by mouth 3 (three) times daily.  05/31/15   [provider]  GENVOYA 150-150-200-10 MG TABS tablet Take 1 tablet by mouth daily. 04/04/17   [provider]  ibuprofen (ADVIL) 600 MG tablet Take 1 tablet (600 mg total) by mouth every 6 (six) hours as needed. 08/21/20   Rushie Chestnut, PA-C  lamoTRIgine (LAMICTAL) 25 MG tablet Take 25 mg by mouth daily.  05/20/17   [provider]  levofloxacin (LEVAQUIN) 500 MG tablet Take 1 tablet (500 mg total) by mouth daily. 05/26/17   Aviva Kluver B, PA-C  metaxalone (SKELAXIN) 800 MG tablet Take 800 mg by mouth 3 (three) times daily. 02/24/16   [provider]  pregabalin (LYRICA) 75 MG capsule Take 75 mg by mouth 3 (three) times daily.    [provider]  gabapentin (NEURONTIN) 300 MG capsule Take 1 capsule (300 mg total) by mouth 3 (three) times daily. Take 300mg  at night on day 1, 300mg  twice a day on day 2, 300mg  three times on day 3 and every day thereafter until you see your primary doctor Patient not taking: Reported on 09/24/2015 02/11/15 08/21/20  Antony Madura, PA-C  ipratropium (ATROVENT) 0.06 % nasal spray Place 2 sprays into both nostrils 4 (four) times daily. Patient not taking: Reported on 05/26/2014 01/15/14 06/03/14  Reuben Likes, MD    Family History Family History  Problem Relation Age of Onset   Healthy Mother    Healthy Father     Social History Social History   Tobacco Use   Smoking status: Every Day    Packs/day: 0.50    Types: Cigarettes   Smokeless tobacco: Never  Substance Use Topics   Alcohol use: No   Drug use: No     Allergies   Amoxicillin   Review of Systems Review of  Systems  Constitutional:  Positive for activity change and appetite change. Negative for fatigue and fever.  HENT:  Negative for congestion, sinus pressure, sneezing and sore throat.   Respiratory:  Negative for cough and shortness of breath.   Cardiovascular:  Negative for chest pain.  Gastrointestinal:  Positive for nausea and vomiting. Negative for abdominal pain and diarrhea.  Musculoskeletal:  Positive for arthralgias (Chronic) and myalgias (Chronic).  Neurological:  Negative for dizziness, light-headedness and headaches.    Physical Exam Triage Vital Signs ED Triage Vitals  Enc Vitals Group     BP 03/21/21 1621 127/67     Pulse Rate 03/21/21 1621 62     Resp 03/21/21 1621 16     Temp 03/21/21 1621 99.1 F (37.3 C)     Temp Source 03/21/21 1621 Oral     SpO2 03/21/21 1621 98 %     Weight --      Height --      Head Circumference --      Peak Flow --      Pain Score 03/21/21 1619 0     Pain Loc --      Pain Edu? --      Excl. in GC? --    No data found.  Updated Vital Signs BP 127/67 (BP Location: Left Arm)   Pulse 62   Temp 99.1 F (37.3 C) (Oral)   Resp 16   SpO2 98%   Visual Acuity Right Eye Distance:   Left Eye Distance:   Bilateral Distance:    Right Eye Near:   Left Eye Near:    Bilateral Near:     Physical Exam Vitals reviewed.  Constitutional:      General: He is awake.     Appearance: Normal appearance. He is well-developed. He is not ill-appearing.     Comments: Very pleasant male appears stated negative chest  HENT:     Head: Normocephalic and atraumatic.     Mouth/Throat:     Mouth: Mucous membranes are moist.     Pharynx: Uvula midline. No oropharyngeal exudate, posterior oropharyngeal erythema or uvula swelling.  Cardiovascular:     Rate and Rhythm: Normal rate and regular rhythm.     Heart sounds: Normal heart sounds, S1 normal and S2 normal. No murmur heard. Pulmonary:     Effort: Pulmonary effort is normal.     Breath sounds:  Normal breath sounds. No stridor. No wheezing, rhonchi or rales.     Comments: Clear to auscultation bilaterally Abdominal:     General: Bowel sounds are normal.     Palpations: Abdomen is soft.     Tenderness: There is no abdominal tenderness. There is no right CVA tenderness, left CVA tenderness, guarding or rebound.     Comments: Benign abdominal exam; no tenderness  palpation.  Neurological:     Mental Status: He is alert.  Psychiatric:        Behavior: Behavior is cooperative.     UC Treatments / Results  Labs (all labs ordered are listed, but only abnormal results are displayed) Labs Reviewed  POCT URINALYSIS DIPSTICK, ED / UC - Abnormal; Notable for the following components:      Result Value   Bilirubin Urine SMALL (*)    Ketones, ur TRACE (*)    Protein, ur 30 (*)    All other components within normal limits    EKG   Radiology No results found.  Procedures Procedures (including critical care time)  Medications Ordered in UC Medications  ondansetron (ZOFRAN-ODT) disintegrating tablet 4 mg (4 mg Oral Given 03/21/21 1649)    Initial Impression / Assessment and Plan / UC Course  I have reviewed the triage vital signs and the nursing notes.  Pertinent labs & imaging results that were available during my care of the patient were reviewed by me and considered in my medical decision making (see chart for details).     Vital signs and physical exam reassuring today; no indication for emergent evaluation or imaging.  Previous blood work was reviewed with patient and he was encouraged to increase potassium rich foods and have this rechecked with his provider in the near future.  Suspect leukocytosis is related to prednisone use but this should be rechecked with PCP as well.  He was given Zofran with significant improvement of symptoms and was able to pass p.o. challenge with crackers and cola.  Prescription for Zofran was sent to pharmacy and patient was encouraged to use  this on a scheduled basis for the next 2 days and then use as needed thereafter.  He was encouraged to push fluids and eat a bland diet by avoiding spicy/acidic/fatty foods.  Recommend he follow-up with his primary care provider first thing next week.  Discussed that if he has any worsening symptoms including abdominal pain, recurrent nausea/vomiting interfering with oral intake, fever, blood in his vomit, blood in his stool he needs to go to the ER.  Strict return precautions given to which he expressed understanding.  Final Clinical Impressions(s) / UC Diagnoses   Final diagnoses:  Nausea and vomiting, unspecified vomiting type     Discharge Instructions      Use Zofran before eating on a routine basis for the next 2 days.  You can then transition to using it as needed.  Make sure you are drinking lots of fluid as we discussed.  Eat a bland diet and avoid spicy/acidic/fatty foods.  If you have any worsening symptoms including abdominal pain, fever, persistent nausea/vomiting interfering with oral intake you need to go to the emergency room as we discussed.     ED Prescriptions     Medication Sig Dispense Auth. Provider   ondansetron (ZOFRAN ODT) 4 MG disintegrating tablet Take 1 tablet (4 mg total) by mouth every 8 (eight) hours as needed for nausea or vomiting. 20 tablet Stacey Maura, Noberto Retort, PA-C      PDMP not reviewed this encounter.   Jeani Hawking, PA-C 03/21/21 1735

## 2021-03-21 NOTE — Discharge Instructions (Signed)
Use Zofran before eating on a routine basis for the next 2 days.  You can then transition to using it as needed.  Make sure you are drinking lots of fluid as we discussed.  Eat a bland diet and avoid spicy/acidic/fatty foods.  If you have any worsening symptoms including abdominal pain, fever, persistent nausea/vomiting interfering with oral intake you need to go to the emergency room as we discussed.

## 2021-03-21 NOTE — ED Provider Notes (Signed)
Emergency Medicine Provider Triage Evaluation Note  Cody Villarreal , a 35 y.o. male  was evaluated in triage.  History of HIV and chronic pain.  Pt complains of nausea since yesterday.  Patient reports his blood recent course of prednisone 3 days ago for chronic pain from his pain management and has been experiencing nausea since.  He reports abdominal pain only when vomiting, and nausea.  No diarrhea or constipation.  He reports he is unable to keep down any foods or fluids.  Reports his viral load is undetectable.  Review of Systems  Positive: Nausea, vomiting, abdominal pain Negative: Diarrhea, constipation, dysuria, hematuria  Physical Exam  BP 133/87   Pulse (!) 54   Temp 98.7 F (37.1 C) (Oral)   Resp 16   SpO2 99%  Gen:   Awake, no distress   Resp:  Normal effort  MSK:   Moves extremities without difficulty  Other:  Abdomen soft and nontender.  Dry mucous membranes.  Medical Decision Making  Medically screening exam initiated at 12:26 PM.  Appropriate orders placed.  Loreta Ave was informed that the remainder of the evaluation will be completed by another provider, this initial triage assessment does not replace that evaluation, and the importance of remaining in the ED until their evaluation is complete.  Labs and Zofran ordered   Achille Rich, Cordelia Poche 03/21/21 1230    Jacalyn Lefevre, MD 03/21/21 [phone]

## 2021-03-21 NOTE — ED Triage Notes (Signed)
Pt states his pain management doctor put him on prednisone 3 days ago and he has been vomiting since he started taking it.  Denies pain at present.

## 2021-03-21 NOTE — ED Triage Notes (Signed)
Pt presents with nausea, vomiting, and fatigue xs 3 days. States was given prednisone by pain doctor and symptoms started after.

## 2021-05-04 ENCOUNTER — Other Ambulatory Visit: Payer: Self-pay | Admitting: Nurse Practitioner

## 2021-05-04 DIAGNOSIS — M542 Cervicalgia: Secondary | ICD-10-CM

## 2021-05-11 NOTE — Progress Notes (Signed)
Office Visit Note  Patient: Cody Villarreal             Date of Birth: November 29, 1985           MRN: [id number]             PCP: Bryon Lions, PA-C Referring: Bryon Lions, PA-C Visit Date: 05/12/2021 Occupation: [employer]  Subjective:  New Patient (Initial Visit) (Pain management, abnormal labs)   History of Present Illness: Cody CRETELLA is a 35 y.o. male here for evaluation of chronic pains and positive ANA with probable sjogrens syndrome. Medical history is significant for well controlled HIV genvoya. He sees pain management with [facility] and has seen [facility] neurosurgery for cervical spine herniation treatment. C3-4 fixation in 2015 with revision in 2017.  He has previously seen rheumatology in 2020 for evaluation of symptoms with positive ANA suspected as Sjogren's syndrome no immunosuppressive treatment started pain management for joint and body pains.  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  He has been having increased pain and bilateral upper extremities and in the knees for the past 6 months.  These particularly been worse after presumed COVID infection in September.  His children at home were sick with positive test though he never had a personal positive COVID antibody test.  Symptoms took about 2 months to resolve but he is continued having increased joint pains and fatigue.  He has noticed increase in bilateral ankle swelling slightly worse on the left side.  He has skin peeling with dryness and itching on the thighs and ankles of both legs.  He had some skin rash and symptoms involving the hands this improved with topical triamcinolone treatment.  He is on chronic pain medication including Lyrica, Cymbalta, as needed ibuprofen, ongoing treatment with pain management at [facility]  More recent lab work-up demonstrated positive ANA with SSB antibodies.   Labs reviewed ANA  pos SSB 2.0 SSAB, Scl-70, chromatin, RNP, dsDNA, SM, Jo-1, centromere Abs neg RF neg CCP neg CRP <4  Activities of Daily Living:  Patient reports morning stiffness for 24 hours.   Patient Reports nocturnal pain.  Difficulty dressing/grooming: Denies Difficulty climbing stairs: Reports Difficulty getting out of chair: Denies Difficulty using hands for taps, buttons, cutlery, and/or writing: Denies  Review of Systems  Constitutional:  Positive for fatigue.  HENT:  Positive for mouth dryness.   Eyes:  Negative for dryness.  Respiratory:  Negative for shortness of breath.   Cardiovascular:  Positive for swelling in legs/feet.  Gastrointestinal:  Negative for constipation.  Endocrine: Positive for cold intolerance, excessive thirst and increased urination.  Genitourinary:  Negative for difficulty urinating.  Musculoskeletal:  Positive for joint pain, gait problem, joint pain, joint swelling, morning stiffness and muscle tenderness.  Skin:  Positive for rash.  Allergic/Immunologic: Negative for susceptible to infections.  Neurological:  Positive for numbness.  Hematological:  Negative for bruising/bleeding tendency.  Psychiatric/Behavioral:  Positive for sleep disturbance.    PMFS History:  Patient Active Problem List   Diagnosis Date Noted   Dry mouth and eyes 05/12/2021   ANA positive 08/28/2020   Foreign body sensation in throat 05/15/2020   Cigarette smoker 05/07/2020   Gastroesophageal reflux disease 05/07/2020   Nausea and vomiting 11/25/2016   Viral warts 11/25/2016   Pain of toe of left foot 08/09/2016   Acute allergic rhinitis 03/22/2016   S/P cervical spinal fusion 03/22/2016  Cervical spondylosis with radiculopathy 11/24/2015   Cervical disc herniation 09/24/2015   Neuropathic pain 09/24/2015   Polyneuropathy associated with underlying disease (HCC) 08/13/2015   Pain in both lower extremities 08/06/2015   Nicotine dependence, uncomplicated 09/18/2013   Pain in  joint involving lower leg 01/20/2012   Human immunodeficiency virus (HIV) disease (HCC) 10/25/2011     Past Medical History:  Diagnosis Date   HIV disease (HCC)    Neuropathy    Patella-femoral syndrome    Sjogren's disease (HCC)     Family History  Problem Relation Age of Onset   Breast cancer Mother    Healthy Father    Past Surgical History:  Procedure Laterality Date   BACK SURGERY     Social History   Social History Narrative   Not on file   Immunization History  Administered Date(s) Administered   Hep A / Hep B 09/23/2011   Hepatitis A, Adult 09/23/2011, 04/10/2012, 11/26/2013   Hepatitis A, Ped/Adol-2 Dose 09/23/2011, 04/10/2012, 11/26/2013   Hepatitis B, adult 09/23/2011   Hepatitis B, ped/adol 09/23/2011   Influenza Nasal 09/23/2011   Influenza Split 09/23/2011, 02/28/2012, 07/09/2013   Influenza, Seasonal, Injecte, Preservative Fre 02/28/2012, 07/09/2013, 02/24/2014, 06/06/2016   Influenza,Quad,Nasal, Live 09/23/2011   Influenza,inj,Quad PF,6+ Mos 02/24/2014, 06/06/2016   Influenza-Unspecified 02/03/2017   PFIZER(Purple Top)SARS-COV-2 Vaccination 01/14/2020, 02/04/2020   PPD Test 09/23/2011   Pneumococcal Conjugate-13 10/16/2012   Pneumococcal Polysaccharide-23 09/23/2011, 10/18/2017, 10/18/2017   Tdap 09/23/2011     Objective: Vital Signs: BP 124/76 (BP Location: Right Arm, Patient Position: Sitting, Cuff Size: Normal)    Pulse 64    Resp 14    Ht 5' 8.5" (1.74 m)    Wt 143 lb (64.9 kg)    BMI 21.43 kg/m    Physical Exam HENT:     Right Ear: External ear normal.     Left Ear: External ear normal.     Mouth/Throat:     Mouth: Mucous membranes are moist.     Pharynx: Oropharynx is clear.  Eyes:     Comments: Mildly red conjunctiva b/l  Cardiovascular:     Rate and Rhythm: Normal rate and regular rhythm.  Pulmonary:     Effort: Pulmonary effort is normal.     Breath sounds: Normal breath sounds.  Skin:    General: Skin is warm and dry.      Comments: Skin flaking on backs of thighs bilaterally and distal legs with no discoloration no loss of hair follicles  Neurological:     Mental Status: He is alert.     Deep Tendon Reflexes: Reflexes normal.  Psychiatric:        Mood and Affect: Mood normal.     Musculoskeletal Exam:  Neck slightly decreased extension no tenderness Shoulders full ROM no tenderness or swelling Elbows full ROM no tenderness or swelling Wrists full ROM no tenderness or swelling Fingers full ROM no tenderness or swelling Knees full ROM no tenderness or swelling Ankles full ROM no tenderness, overlying edema swelling  Investigation: No additional findings.  Imaging: No results found.  Recent Labs: Lab Results  Component Value Date   WBC 14.4 (H) 03/21/2021   HGB 13.0 03/21/2021   PLT 427 (H) 03/21/2021   NA 139 03/21/2021   K 3.3 (L) 03/21/2021   CL 100 03/21/2021   CO2 28 03/21/2021   GLUCOSE 95 03/21/2021   BUN 12 03/21/2021   CREATININE 0.90 03/21/2021   BILITOT 0.5 03/21/2021   ALKPHOS 63  03/21/2021   AST 15 03/21/2021   ALT 10 03/21/2021   PROT 7.2 03/21/2021   ALBUMIN 3.2 (L) 03/21/2021   CALCIUM 9.1 03/21/2021   GFRAA >60 05/25/2017    Speciality Comments: No specialty comments available.  Procedures:  No procedures performed Allergies: Amoxicillin and Prednisone   Assessment / Plan:     Visit Diagnoses: ANA positive - Plan: C3 and C4, Sjogrens syndrome-B extractable nuclear antibody, Sedimentation rate  Positive ANA and keratoconjunctivitis sicca symptoms consistent with Sjogren's syndrome.  We will recheck antibody markers also sedimentation rate and serum complements as markers for activity monitoring and prognostic value.  Exam is pretty nonspecific for inflammation otherwise today.  Could have symptom exacerbation related to recent infection as a trigger in which case may improve more over time with current symptomatic treatment.  Neuropathic pain  Chronic fairly  generalized pain and sensitivity in multiple areas.  He is already on all established regimen for SNRI medication.  No gross neurologic deficit or motor deficit on exam.  Dry mouth and eyes  Significant eye dryness with persistent watering is most noticeable issue.  Reviewed use of topical lubricating drops.  Recommended addition of a gel or ointment based treatment for the eyes at night for greater protection.  Reviewed nonpharmacologic status for salivary gland stimulation no specific lesions or changes on exam do not recommend secretagogue treatment at this time.  Pain in both lower extremities  Leg pains not very typical for inflammatory joint disease by exam and history no specific changes on exam today.  If nothing specific being found also consider ultrasound inspection of ankle joints for any structural changes such as enthesitis.  Orders: Orders Placed This Encounter  Procedures   C3 and C4   Sjogrens syndrome-B extractable nuclear antibody   Sedimentation rate    No orders of the defined types were placed in this encounter.    Follow-Up Instructions: Return in about 3 weeks (around 06/02/2021) for New pt pSS f/u 3wks.   Fuller Plan, MD  Note - This record has been created using [employer].  Chart creation errors have been sought, but may not always  have been located. Such creation errors do not reflect on  the standard of medical care.

## 2021-05-12 ENCOUNTER — Ambulatory Visit: Payer: Medicaid Other | Admitting: Internal Medicine

## 2021-05-12 ENCOUNTER — Other Ambulatory Visit: Payer: Self-pay

## 2021-05-12 ENCOUNTER — Encounter: Payer: Self-pay | Admitting: Internal Medicine

## 2021-05-12 VITALS — BP 124/76 | HR 64 | Resp 14 | Ht 68.5 in | Wt 143.0 lb

## 2021-05-12 DIAGNOSIS — M79604 Pain in right leg: Secondary | ICD-10-CM

## 2021-05-12 DIAGNOSIS — M79605 Pain in left leg: Secondary | ICD-10-CM

## 2021-05-12 DIAGNOSIS — H04123 Dry eye syndrome of bilateral lacrimal glands: Secondary | ICD-10-CM

## 2021-05-12 DIAGNOSIS — M792 Neuralgia and neuritis, unspecified: Secondary | ICD-10-CM

## 2021-05-12 DIAGNOSIS — R768 Other specified abnormal immunological findings in serum: Secondary | ICD-10-CM | POA: Diagnosis not present

## 2021-05-12 DIAGNOSIS — R682 Dry mouth, unspecified: Secondary | ICD-10-CM

## 2021-05-12 NOTE — Patient Instructions (Addendum)
I am checking antibody and inflammatory markers today for evidence of systemic inflammation related to sjogrens syndrome. Your exam is mostly reassuring there is no very concerning problem standing out.  For dry eyes I agree with continuing drops for this at night use of gel or ointment based lubricating eye drops can be more helpful due to staying in place longer.  For dry mouth continue staying well hydrated. Biotene mouth wash or spray can be helpful to stimulate saliva production. Sugar free gums or lozenges can also help stimulate saliva production. Keep a close eye on dental hygiene dry mouth increases the risk for dental and gingival disease.

## 2021-05-13 LAB — SJOGRENS SYNDROME-B EXTRACTABLE NUCLEAR ANTIBODY: SSB (La) (ENA) Antibody, IgG: <1 AI

## 2021-05-13 LAB — SEDIMENTATION RATE: Sed Rate: 19 mm/h — ABNORMAL HIGH (ref 0–15)

## 2021-05-13 LAB — C3 AND C4
C3 Complement: 119 mg/dL (ref 82–185)
C4 Complement: 26 mg/dL (ref 15–53)

## 2021-06-05 ENCOUNTER — Other Ambulatory Visit: Payer: Medicaid Other

## 2021-06-23 ENCOUNTER — Ambulatory Visit: Payer: Medicaid Other | Attending: Nurse Practitioner

## 2021-06-23 ENCOUNTER — Other Ambulatory Visit: Payer: Self-pay

## 2021-06-23 DIAGNOSIS — R252 Cramp and spasm: Secondary | ICD-10-CM | POA: Diagnosis present

## 2021-06-23 DIAGNOSIS — M542 Cervicalgia: Secondary | ICD-10-CM | POA: Diagnosis present

## 2021-06-23 NOTE — Therapy (Addendum)
OUTPATIENT PHYSICAL THERAPY CERVICAL EVALUATION   Patient Name: Cody Villarreal MRN: [id number] DOB:1986-03-14, 36 y.o., male Today's Date: 06/23/2021    Past Medical History:  Diagnosis Date   HIV disease (HCC)    Neuropathy    Patella-femoral syndrome    Sjogren's disease (HCC)    Past Surgical History:  Procedure Laterality Date   BACK SURGERY     Patient Active Problem List   Diagnosis Date Noted   Dry mouth and eyes 05/12/2021   ANA positive 08/28/2020   Foreign body sensation in throat 05/15/2020   Cigarette smoker 05/07/2020   Gastroesophageal reflux disease 05/07/2020   Nausea and vomiting 11/25/2016   Viral warts 11/25/2016   Pain of toe of left foot 08/09/2016   Acute allergic rhinitis 03/22/2016   S/P cervical spinal fusion 03/22/2016   Cervical spondylosis with radiculopathy 11/24/2015   Cervical disc herniation 09/24/2015   Neuropathic pain 09/24/2015   Polyneuropathy associated with underlying disease (HCC) 08/13/2015   Pain in both lower extremities 08/06/2015   Nicotine dependence, uncomplicated 09/18/2013   Pain in joint involving lower leg 01/20/2012   Human immunodeficiency virus (HIV) disease (HCC) 10/25/2011    PCP: Bryon Lions, PA-C  REFERRING PROVIDER: Merryl Hacker, NP  REFERRING DIAG: Cervicalgia  THERAPY DIAG:  No diagnosis found.  ONSET DATE: 2014. 2016  SUBJECTIVE:                                                                                                                                                                                                         SUBJECTIVE STATEMENT: Pt reports having chronic neck and tightness since spinal fusions; Pt thnks C2-3 and C5-6 levels. Pt notes having small fibroneuropathy as well. Pt reports pain and tightness across his shoulders. Has not had therapy in the past. Sees pain management. His pain is constant and would like for it to be better managed. Pt has a 30 min home therapy  routine or walks daily. His ex routine and walking hey don't decrease the pain, but warm and loosen him up. Pt reports pain of his back and Les.  PERTINENT HISTORY:  HIV, small fibroneuropathy, Sjogren's disease, Hx of cervical fusions  PAIN:  Are you having pain? Yes NPRS scale: 9/10; 7-10/10 pain range Pain location: lower neck and upper shoulders, entire body/legs Pain orientation: Bilateral  PAIN TYPE: aching and burning Pain description: constant  Aggravating factors: prolonged sitting and standing Relieving factors: Sleep, deep squatting, moving, weight through legs  PRECAUTIONS: HIV  WEIGHT BEARING RESTRICTIONS No  FALLS:  Has  patient fallen in last 6 months? No Number of falls: 0  LIVING ENVIRONMENT: Lives with: lives with their family  3 children No issue with accessing or mobility within home  OCCUPATION: Office/computer work  PLOF: Independent  PATIENT GOALS Better managed pain levels. Not to need to sign off from work 2-3x per week due to pain.  OBJECTIVE:   DIAGNOSTIC FINDINGS:  Xray Cervical 02/15/17 IMPRESSION: Postoperative change with support hardware intact. Areas of mild osteoarthritic change. No fracture or spondylolisthesis. Scoliosis. Reversal lordotic curvature is present, a finding most likely indicative of a degree of chronic muscle spasm.  Xray lumbar 02/15/17 IMPRESSION: Scoliosis. No fracture or spondylolisthesis. No appreciable arthropathy.   COGNITION: Overall cognitive status: Within functional limits for tasks assessed   SENSATION: Light touch: Appears intact  POSTURE:  Forward head, rounded shoulders, ant. pelvic tilt, scoliosis with R rib hump  PALPATION: TTP c increased muscle tension of the upper trap, levator and cervical paraspinals    CERVICAL AROM/PROM  A/PROM A/PROM (deg) 06/23/2021  Flexion 47, post necking c pulling pain  Extension 80, min pain  Right lateral flexion 21, pulling pain L  Left lateral flexion  25. Pulling pain R  Right rotation 62, pulling pain L  Left rotation 63, pulling pain R   (Blank rows = not tested)  UE AROM/PROM:  Grossly WNLs  UE MMT:   UE myotomal screen neg  CERVICAL SPECIAL TESTS:  Spurling's test: Negative   FUNCTIONAL TESTS:  5 times sit to stand: TBA 2 minute walk test: TBA    TODAY'S TREATMENT:  Upper trap stretch x2, 20 sec Levator stretch x2, 20 sec   PATIENT EDUCATION:  Education details: Eval findings, POC, HEP, proper posture and computer set up c hnadout Person educated: Patient Education method: Explanation, Demonstration, Tactile cues, Verbal cues, and Handouts Education comprehension: verbalized understanding, returned demonstration, verbal cues required, and tactile cues required   HOME EXERCISE PROGRAM: Access Code: [email or web address] URL: [email or web address] Date: 06/24/2021 Prepared by: Joellyn RuedAllen Tod Abrahamsen  Exercises Seated Upper Trapezius Stretch - 2-3 x daily - 7 x weekly - 1 sets - 3 reps - 10-20 hold Gentle Levator Scapulae Stretch - 2-3 x daily - 7 x weekly - 1 sets - 3 reps - 10-20 hold   ASSESSMENT:  CLINICAL IMPRESSION: Patient is a 36 y.o. M who was seen today for physical therapy evaluation and treatment for cervicalgia c a Hx of 2 cervical fusions reported Dx of small fibroneuropathy. Objective impairments include decreased ROM, decreased strength, increased muscle spasms, impaired flexibility, postural dysfunction, and pain. These impairments are limiting patient from cleaning, driving, meal prep, occupation, laundry, yard work, and shopping. Personal factors including Fitness, Past/current experiences, Profession, Time since onset of injury/illness/exacerbation, and 1-2 comorbidities: Hx of 2 cervical fusions and small fibroneuropathy  are also affecting patient's functional outcome. Patient will benefit from skilled PT to address above impairments and improve overall function.  REHAB POTENTIAL: Fair due to  chronicity of pain  CLINICAL DECISION MAKING: Evolving/moderate complexity  EVALUATION COMPLEXITY: Moderate   GOALS:   SHORT TERM GOALS= LTGs   LONG TERM GOALS:   LTG Name Target Date Goal status  1 Pt will be Ind in a final HEP to maintain achieved level of function Baseline: 08/13/21 INITIAL  2 Pt will voice understanding of measure to assist in pain reduction and management of pain Baseline: 08/13/21 INITIAL  3 Pt will report a decrease in in cervical and upper shoulder pain to 6/10 or less with daily  activities Baseline:7-10/10 08/13/21 INITIAL  4 Decrease the number of sign offs from work to 1/week Baseline:2-3/week 08/13/21 INITIAL  5 Pt will be able to demonstrate proper sitting posture to minimize cervical strain Baseline: 08/13/21 INITIAL  6 Improve cervical lat flexion to 35d bilat for improved cervical function Baseline: 08/13/21 INITIAL   PLAN: PT FREQUENCY: 2x/week  PT DURATION: 6 weeks  PLANNED INTERVENTIONS: Therapeutic exercises, Therapeutic activity, Neuro Muscular re-education, Patient/Family education, Joint mobilization, Dry Needling, Electrical stimulation, Cryotherapy, Moist heat, Taping, Traction, Ultrasound, Ionotophoresis 4mg /ml Dexamethasone, and Manual therapy  PLAN FOR NEXT SESSION: Assess response to HEP and postural ed, progress therex as indicated, use of modalities and manual therapy as indicated.   Rogerio Boutelle MS, PT 06/24/21 6:00 AM  Check all possible CPT codes: 06/26/21- Therapeutic Exercise, [phone]- Neuro Re-education, 97140 - Manual Therapy, 97530 - Therapeutic Activities, 97535 - Self Care, [phone] - Mechanical traction, 97014 - Electrical stimulation (unattended), 24235 - Electrical stimulation (Manual), [id number] - Iontophoresis, and [id number] - Ultrasound

## 2021-07-01 ENCOUNTER — Ambulatory Visit: Payer: Medicaid Other

## 2021-07-03 ENCOUNTER — Ambulatory Visit: Payer: Medicaid Other | Attending: Nurse Practitioner

## 2021-07-03 ENCOUNTER — Other Ambulatory Visit: Payer: Self-pay

## 2021-07-03 DIAGNOSIS — M542 Cervicalgia: Secondary | ICD-10-CM | POA: Insufficient documentation

## 2021-07-03 DIAGNOSIS — R252 Cramp and spasm: Secondary | ICD-10-CM | POA: Insufficient documentation

## 2021-07-03 NOTE — Therapy (Signed)
OUTPATIENT PHYSICAL THERAPY TREATMENT NOTE   Patient Name: Cody Villarreal MRN: [id number] DOB:1985-06-23, 36 y.o., male Today's Date: 07/03/2021  PCP: Bryon Lions, PA-C REFERRING PROVIDER: Merryl Hacker, NP   PT End of Session - 07/03/21 [phone]     Visit Number 2    Number of Visits 13    Date for PT Re-Evaluation 08/13/21    Authorization Type [place] MEDICAID HEALTHY BLUE    Progress Note Due on Visit 10    PT Start Time 0954    PT Stop Time 1033    PT Time Calculation (min) 39 min    Activity Tolerance Patient tolerated treatment well    Behavior During Therapy [facility] for tasks assessed/performed             Past Medical History:  Diagnosis Date   HIV disease (HCC)    Neuropathy    Patella-femoral syndrome    Sjogren's disease (HCC)    Past Surgical History:  Procedure Laterality Date   BACK SURGERY     Patient Active Problem List   Diagnosis Date Noted   Dry mouth and eyes 05/12/2021   ANA positive 08/28/2020   Foreign body sensation in throat 05/15/2020   Cigarette smoker 05/07/2020   Gastroesophageal reflux disease 05/07/2020   Nausea and vomiting 11/25/2016   Viral warts 11/25/2016   Pain of toe of left foot 08/09/2016   Acute allergic rhinitis 03/22/2016   S/P cervical spinal fusion 03/22/2016   Cervical spondylosis with radiculopathy 11/24/2015   Cervical disc herniation 09/24/2015   Neuropathic pain 09/24/2015   Polyneuropathy associated with underlying disease (HCC) 08/13/2015   Pain in both lower extremities 08/06/2015   Nicotine dependence, uncomplicated 09/18/2013   Pain in joint involving lower leg 01/20/2012   Human immunodeficiency virus (HIV) disease (HCC) 10/25/2011    REFERRING DIAG: Cervicalgia  THERAPY DIAG:  Cervicalgia  Cramp and spasm  PERTINENT HISTORY: HIV, small fibroneuropathy, Sjogren's disease, Hx of cervical fusions  PRECAUTIONS: HIV  ONSET DATE: 2014. 2016  SUBJECTIVE: My pain is pretty high today, I had a  hard time sleeping last night because it was a 10/10.  PAIN:  Are you having pain? Yes NPRS scale: 7.5/10 Pain location: lower neck and upper shoulders, entire body/legs Pain orientation: Bilateral  PAIN TYPE: aching and burning Pain description: constant  Aggravating factors: prolonged sitting and standing Relieving factors: Sleep, deep squatting, moving, weight through legs    OBJECTIVE:    DIAGNOSTIC FINDINGS:  Xray Cervical 02/15/17 IMPRESSION: Postoperative change with support hardware intact. Areas of mild osteoarthritic change. No fracture or spondylolisthesis. Scoliosis. Reversal lordotic curvature is present, a finding most likely indicative of a degree of chronic muscle spasm.   Xray lumbar 02/15/17 IMPRESSION: Scoliosis. No fracture or spondylolisthesis. No appreciable arthropathy.     COGNITION: Overall cognitive status: Within functional limits for tasks assessed            SENSATION: Light touch: Appears intact   POSTURE:  Forward head, rounded shoulders, ant. pelvic tilt, scoliosis with R rib hump   PALPATION: TTP c increased muscle tension of the upper trap, levator and cervical paraspinals          CERVICAL AROM/PROM   A/PROM A/PROM (deg) 06/23/2021  Flexion 47, post necking c pulling pain  Extension 80, min pain  Right lateral flexion 21, pulling pain L  Left lateral flexion 25. Pulling pain R  Right rotation 62, pulling pain L  Left rotation 63, pulling pain R   (  Blank rows = not tested)   UE AROM/PROM:   Grossly WNLs   UE MMT:             UE myotomal screen neg   CERVICAL SPECIAL TESTS:  Spurling's test: Negative     FUNCTIONAL TESTS:  5 times sit to stand: TBA 2 minute walk test: TBA       TODAY'S TREATMENT:   [facility] Adult PT Treatment:                                                DATE: 07/03/2021 Therapeutic Exercise: Upper trap stretch x 30 sec BIL Levator scap stretch x 30 sec BIL IR towel stretch x 30 sec BIL Eagle  arms x 20 sec BIL Seated Horizontal abduction RTB x10 Seated Diagonals RTB x 10 BIL Standing PNF D2 flexion RTB x 5 BIL Rows RTB x 10 Shoulder extension RTB x 10 Palloff press RTB x 10 Chest fly RTB x 10  Corner pec stretch 30 sec x 10 Manual Therapy: STM, MTPR upper trap   [name]:                                                DATE: 06/23/2021 Upper trap stretch x2, 20 sec Levator stretch x2, 20 sec     PATIENT EDUCATION:  Education details: Mentioned TPDN (pt interested) Update HEP, Eval findings, POC, HEP, proper posture and computer set up c hnadout Person educated: Patient Education method: Explanation, Demonstration, Tactile cues, Verbal cues, and Handouts Education comprehension: verbalized understanding, returned demonstration, verbal cues required, and tactile cues required     HOME EXERCISE PROGRAM: Access Code: [email or web address] URL: [email or web address] Date: 07/03/2021 Prepared by: Harland German  Exercises Seated Upper Trapezius Stretch - 2-3 x daily - 7 x weekly - 1 sets - 3 reps - 10-20 hold Gentle Levator Scapulae Stretch - 2-3 x daily - 7 x weekly - 1 sets - 3 reps - 10-20 hold Added 07/03/2021 Standing Shoulder Horizontal Abduction with Resistance - 1 x daily - 7 x weekly - 2 sets - 10 reps Standing Shoulder Diagonal Horizontal Abduction 60/120 Degrees with Resistance - 1 x daily - 7 x weekly - 2 sets - 10 reps      ASSESSMENT:   CLINICAL IMPRESSION: Patient presents to therapy with high level of pain but good motivation and participation in exercises. He reports compliance with his HEP and updated HEP today to include more strengthening exercises. Manual therapy to upper traps with noted tenderness especially on R side, pt expressed interest in TPDN. Patient continues to benefit from skilled PT services and should be progressed as able to improve functional independence.    REHAB POTENTIAL: Fair due to chronicity of pain    CLINICAL DECISION MAKING: Evolving/moderate complexity   EVALUATION COMPLEXITY: Moderate     GOALS:     SHORT TERM GOALS= LTGs     LONG TERM GOALS:    LTG Name Target Date Goal status  1 Pt will be Ind in a final HEP to maintain achieved level of function Baseline: 08/13/21 INITIAL  2 Pt will voice understanding of measure to assist in pain reduction and management of pain Baseline: 08/13/21 INITIAL  3 Pt will report a decrease in in cervical and upper shoulder pain to 6/10 or less with daily activities Baseline:7-10/10 08/13/21 INITIAL  4 Decrease the number of sign offs from work to 1/week Baseline:2-3/week 08/13/21 INITIAL  5 Pt will be able to demonstrate proper sitting posture to minimize cervical strain Baseline: 08/13/21 INITIAL  6 Improve cervical lat flexion to 35d bilat for improved cervical function Baseline: 08/13/21 INITIAL    PLAN: PT FREQUENCY: 2x/week   PT DURATION: 6 weeks   PLANNED INTERVENTIONS: Therapeutic exercises, Therapeutic activity, Neuro Muscular re-education, Patient/Family education, Joint mobilization, Dry Needling, Electrical stimulation, Cryotherapy, Moist heat, Taping, Traction, Ultrasound, Ionotophoresis 4mg /ml Dexamethasone, and Manual therapy   PLAN FOR NEXT SESSION: Assess response to updated HEP and postural ed, discuss TPDN, progress therex as indicated, use of modalities and manual therapy as indicated.    Harland GermanStephanie Marvis Saefong, PTA 07/03/2021, 10:40 AM

## 2021-07-08 ENCOUNTER — Ambulatory Visit: Payer: Medicaid Other

## 2021-07-08 ENCOUNTER — Other Ambulatory Visit: Payer: Self-pay

## 2021-07-08 DIAGNOSIS — R252 Cramp and spasm: Secondary | ICD-10-CM

## 2021-07-08 DIAGNOSIS — M542 Cervicalgia: Secondary | ICD-10-CM | POA: Diagnosis not present

## 2021-07-08 NOTE — Therapy (Signed)
OUTPATIENT PHYSICAL THERAPY TREATMENT NOTE   Patient Name: Cody Villarreal MRN: [id number] DOB:Jun 10, 1985, 36 y.o., male Today's Date: 07/08/2021  PCP: Loyola Mast, PA-C REFERRING PROVIDER: Jeanella Anton, NP   PT End of Session - 07/08/21 1026     Visit Number 3    Number of Visits 13    Date for PT Re-Evaluation 08/13/21    Authorization Type [place] MEDICAID HEALTHY BLUE    Progress Note Due on Visit 10    PT Start Time 0933    PT Stop Time 1020    PT Time Calculation (min) 47 min    Activity Tolerance Patient tolerated treatment well    Behavior During Therapy [facility]FL for tasks assessed/performed              Past Medical History:  Diagnosis Date   HIV disease ([place])    Neuropathy    Patella-femoral syndrome    Sjogren's disease ([place])    Past Surgical History:  Procedure Laterality Date   BACK SURGERY     Patient Active Problem List   Diagnosis Date Noted   Dry mouth and eyes 05/12/2021   ANA positive 08/28/2020   Foreign body sensation in throat 05/15/2020   Cigarette smoker 05/07/2020   Gastroesophageal reflux disease 05/07/2020   Nausea and vomiting 11/25/2016   Viral warts 11/25/2016   Pain of toe of left foot 08/09/2016   Acute allergic rhinitis 03/22/2016   S/P cervical spinal fusion 03/22/2016   Cervical spondylosis with radiculopathy 11/24/2015   Cervical disc herniation 09/24/2015   Neuropathic pain 09/24/2015   Polyneuropathy associated with underlying disease ([place]) 08/13/2015   Pain in both lower extremities 08/06/2015   Nicotine dependence, uncomplicated [id number]   Pain in joint involving lower leg 01/20/2012   Human immunodeficiency virus (HIV) disease ([place]) 10/25/2011    REFERRING DIAG: Cervicalgia  THERAPY DIAG:  Cervicalgia  Cramp and spasm  PERTINENT HISTORY: HIV, small fibroneuropathy, Sjogren's disease, Hx of cervical fusions  PRECAUTIONS: HIV  ONSET DATE: 2014. 2016  SUBJECTIVE: Pt reports the HEP is helping to loosen  the neck muscles/  PAIN:  Are you having pain? Yes NPRS scale: 7/10 Pain location: lower neck and upper shoulders, entire body/legs Pain orientation: Bilateral  PAIN TYPE: aching and burning Pain description: constant  Aggravating factors: prolonged sitting and standing Relieving factors: Sleep, deep squatting, moving, weight through legs    OBJECTIVE:    DIAGNOSTIC FINDINGS:  Xray Cervical 02/15/17 IMPRESSION: Postoperative change with support hardware intact. Areas of mild osteoarthritic change. No fracture or spondylolisthesis. Scoliosis. Reversal lordotic curvature is present, a finding most likely indicative of a degree of chronic muscle spasm.   Xray lumbar 02/15/17 IMPRESSION: Scoliosis. No fracture or spondylolisthesis. No appreciable arthropathy.     COGNITION: Overall cognitive status: Within functional limits for tasks assessed            SENSATION: Light touch: Appears intact   POSTURE:  Forward head, rounded shoulders, ant. pelvic tilt, scoliosis with R rib hump   PALPATION: TTP c increased muscle tension of the upper trap, levator and cervical paraspinals          CERVICAL AROM/PROM   A/PROM A/PROM (deg) 06/23/2021  Flexion 47, post necking c pulling pain  Extension 80, min pain  Right lateral flexion 21, pulling pain L  Left lateral flexion 25. Pulling pain R  Right rotation 62, pulling pain L  Left rotation 63, pulling pain R   (Blank rows = not tested)  UE AROM/PROM:   Grossly WNLs   UE MMT:             UE myotomal screen neg   CERVICAL SPECIAL TESTS:  Spurling's test: Negative     FUNCTIONAL TESTS:  5 times sit to stand: TBA 2 minute walk test: TBA       TODAY'S TREATMENT:  [facility] Adult PT Treatment:                                                DATE: 07/08/2021 Therapeutic Exercise: Upper trap stretch 2 x 30 sec BIL Levator scap stretch 2 x 30 sec BIL Cervical rotation 2 x30 sec BIL Seated Horizontal abduction RTB  2x10 Seated ER RTB 2x10 Cervical retraction x10, 3 sec Corner pec stretch 30 sec 2 x 10 Manual Therapy: STM to the cervical paraspinals, upper traps and levator, MTPR upper trap.   Trigger Point Dry Needling Treatment: Skilled palpation for the identification of TP. Increased muscle tension and more tender TP of the R upper trap Pre-treatment instruction: Patient instructed on dry needling rationale, procedures, and possible side effects including pain during treatment (achy,cramping feeling), bruising, drop of blood, lightheadedness, nausea, sweating. Patient Consent Given: Yes Education handout provided: Yes Muscles treated: upper trap L and R Needle size and number:  .30x75mm, 1 Electrical stimulation performed: No Parameters: N/A Treatment response/outcome: Twitch response elicited and Palpable decrease in muscle tension Post-treatment instructions: Patient instructed to expect possible mild to moderate muscle soreness later today and/or tomorrow. Patient instructed in methods to reduce muscle soreness and to continue prescribed HEP. If patient was dry needled over the lung field, patient was instructed on signs and symptoms of pneumothorax and, however unlikely, to see immediate medical attention should they occur. Patient was also educated on signs and symptoms of infection and to seek medical attention should they occur. Patient verbalized understanding of these instructions and education.    [facility] Adult PT Treatment:                                                DATE: 07/03/2021 Therapeutic Exercise: Upper trap stretch x 30 sec BIL Levator scap stretch x 30 sec BIL IR towel stretch x 30 sec BIL Eagle arms x 20 sec BIL Seated Horizontal abduction RTB x10 Seated Diagonals RTB x 10 BIL Standing PNF D2 flexion RTB x 5 BIL Rows RTB x 10 Shoulder extension RTB x 10 Palloff press RTB x 10 Chest fly RTB x 10  Corner pec stretch 30 sec x 10 Manual Therapy: STM, MTPR upper  trap   [name]:                                                DATE: 06/23/2021 Upper trap stretch x2, 20 sec Levator stretch x2, 20 sec     PATIENT EDUCATION:  Education details: Mentioned TPDN (pt interested) Update HEP, Eval findings, POC, HEP, proper posture and computer set up c hnadout Person educated: Patient Education method: Explanation, Demonstration, Tactile cues, Verbal cues, and Handouts Education comprehension: verbalized understanding, returned  demonstration, verbal cues required, and tactile cues required     HOME EXERCISE PROGRAM: Access Code: [email or web address] URL: [email or web address] Date: 07/03/2021 Prepared by: Evelene Croon  Exercises Seated Upper Trapezius Stretch - 2-3 x daily - 7 x weekly - 1 sets - 3 reps - 10-20 hold Gentle Levator Scapulae Stretch - 2-3 x daily - 7 x weekly - 1 sets - 3 reps - 10-20 hold Added 07/03/2021 Standing Shoulder Horizontal Abduction with Resistance - 1 x daily - 7 x weekly - 2 sets - 10 reps Standing Shoulder Diagonal Horizontal Abduction 60/120 Degrees with Resistance - 1 x daily - 7 x weekly - 2 sets - 10 reps      ASSESSMENT:   CLINICAL IMPRESSION: PT was completed for STM to the cervical paraspinals, upper traps, levators with MTPR to the upper trap. TPDN was then completed to the upper traps bilat and f/b cervical stretching/ROM and posterior chain strengthening. Pt tolerated the session well without adverse effects. Pt will continue to benefit from skilled PT to address pain, strength, and ROM deficits to optimize function and QOL    REHAB POTENTIAL: Fair due to chronicity of pain   CLINICAL DECISION MAKING: Evolving/moderate complexity   EVALUATION COMPLEXITY: Moderate     GOALS:     SHORT TERM GOALS= LTGs     LONG TERM GOALS:    LTG Name Target Date Goal status  1 Pt will be Ind in a final HEP to maintain achieved level of function Baseline: 08/13/21 INITIAL  2 Pt will voice  understanding of measure to assist in pain reduction and management of pain Baseline: 08/13/21 INITIAL  3 Pt will report a decrease in in cervical and upper shoulder pain to 6/10 or less with daily activities Baseline:7-10/10 08/13/21 INITIAL  4 Decrease the number of sign offs from work to 1/week Baseline:2-3/week 08/13/21 INITIAL  5 Pt will be able to demonstrate proper sitting posture to minimize cervical strain Baseline: 08/13/21 INITIAL  6 Improve cervical lat flexion to 35d bilat for improved cervical function Baseline: 08/13/21 INITIAL    PLAN: PT FREQUENCY: 2x/week   PT DURATION: 6 weeks   PLANNED INTERVENTIONS: Therapeutic exercises, Therapeutic activity, Neuro Muscular re-education, Patient/Family education, Joint mobilization, Dry Needling, Electrical stimulation, Cryotherapy, Moist heat, Taping, Traction, Ultrasound, Ionotophoresis 4mg /ml Dexamethasone, and Manual therapy   PLAN FOR NEXT SESSION: Assess response to updated HEP and postural ed, discuss TPDN, progress therex as indicated, use of modalities and manual therapy as indicated.    Nasira Janusz MS, PT 07/08/21 10:48 AM

## 2021-07-10 ENCOUNTER — Telehealth: Payer: Self-pay

## 2021-07-10 NOTE — Telephone Encounter (Signed)
Left MessageCommunicated - LVM about missed appointment this morning. Reviewed next appointment time and clinic phone number if rescheduling needed.

## 2021-07-10 NOTE — Therapy (Incomplete)
OUTPATIENT PHYSICAL THERAPY TREATMENT NOTE   Patient Name: Cody Villarreal MRN: [id number] DOB:[birth year], 36 y.o., male Today's Date: 07/10/2021  PCP: Bryon Lions, PA-C REFERRING PROVIDER: Merryl Hacker, NP      Past Medical History:  Diagnosis Date   HIV disease (HCC)    Neuropathy    Patella-femoral syndrome    Sjogren's disease [facility])    Past Surgical History:  Procedure Laterality Date   BACK SURGERY     Patient Active Problem List   Diagnosis Date Noted   Dry mouth and eyes 05/12/2021   ANA positive 08/28/2020   Foreign body sensation in throat 05/15/2020   Cigarette smoker 05/07/2020   Gastroesophageal reflux disease 05/07/2020   Nausea and vomiting 11/25/2016   Viral warts 11/25/2016   Pain of toe of left foot 08/09/2016   Acute allergic rhinitis 03/22/2016   S/P cervical spinal fusion 03/22/2016   Cervical spondylosis with radiculopathy 11/24/2015   Cervical disc herniation 09/24/2015   Neuropathic pain 09/24/2015   Polyneuropathy associated with underlying disease (HCC) 08/13/2015   Pain in both lower extremities 08/06/2015   Nicotine dependence, uncomplicated 09/18/2013   Pain in joint involving lower leg 01/20/2012   Human immunodeficiency virus (HIV) disease (HCC) 10/25/2011    REFERRING DIAG: Cervicalgia  THERAPY DIAG:  No diagnosis found.  PERTINENT HISTORY: HIV, small fibroneuropathy, Sjogren's disease, Hx of cervical fusions  PRECAUTIONS: HIV  ONSET DATE: 2014. 2016  SUBJECTIVE: *** Pt reports the HEP is helping to loosen the neck muscles/  PAIN:  Are you having pain? Yes NPRS scale: ***/10 Pain location: lower neck and upper shoulders, entire body/legs Pain orientation: Bilateral  PAIN TYPE: aching and burning Pain description: constant  Aggravating factors: prolonged sitting and standing Relieving factors: Sleep, deep squatting, moving, weight through legs    OBJECTIVE:    DIAGNOSTIC FINDINGS:  Xray Cervical  02/15/17 IMPRESSION: Postoperative change with support hardware intact. Areas of mild osteoarthritic change. No fracture or spondylolisthesis. Scoliosis. Reversal lordotic curvature is present, a finding most likely indicative of a degree of chronic muscle spasm.   Xray lumbar 02/15/17 IMPRESSION: Scoliosis. No fracture or spondylolisthesis. No appreciable arthropathy.     COGNITION: Overall cognitive status: Within functional limits for tasks assessed            SENSATION: Light touch: Appears intact   POSTURE:  Forward head, rounded shoulders, ant. pelvic tilt, scoliosis with R rib hump   PALPATION: TTP c increased muscle tension of the upper trap, levator and cervical paraspinals          CERVICAL AROM/PROM   A/PROM A/PROM (deg) 06/23/2021  Flexion 47, post necking c pulling pain  Extension 80, min pain  Right lateral flexion 21, pulling pain L  Left lateral flexion 25. Pulling pain R  Right rotation 62, pulling pain L  Left rotation 63, pulling pain R   (Blank rows = not tested)   UE AROM/PROM:   Grossly WNLs   UE MMT:             UE myotomal screen neg   CERVICAL SPECIAL TESTS:  Spurling's test: Negative     FUNCTIONAL TESTS:  5 times sit to stand: TBA 2 minute walk test: TBA       TODAY'S TREATMENT:  [facility] Adult PT Treatment:  DATE: 07/10/2021 Therapeutic Exercise: UBE level 4, 2.5 mins forward/retro Upper trap stretch Levator scap stretch Palloff press 7# Rows FM 10# Chops FM 7# Reverse chops FM 7# Seated horizontal abduction RTB Seated ER RTB Corner pec stretch Posterior capsule stretch Manual Therapy: STM to the cervical paraspinals, upper traps and levator, MTPR upper trap.  Neuromuscular re-ed: *** Therapeutic Activity: *** Modalities: *** Self Care: Tennis ball/theracane for upper trap   [facility] Adult PT Treatment:                                                DATE:  07/08/2021 Therapeutic Exercise: Upper trap stretch 2 x 30 sec BIL Levator scap stretch 2 x 30 sec BIL Cervical rotation 2 x30 sec BIL Seated Horizontal abduction RTB 2x10 Seated ER RTB 2x10 Cervical retraction x10, 3 sec Corner pec stretch 30 sec 2 x 10 Manual Therapy: STM to the cervical paraspinals, upper traps and levator, MTPR upper trap.   Trigger Point Dry Needling Treatment: Skilled palpation for the identification of TP. Increased muscle tension and more tender TP of the R upper trap Pre-treatment instruction: Patient instructed on dry needling rationale, procedures, and possible side effects including pain during treatment (achy,cramping feeling), bruising, drop of blood, lightheadedness, nausea, sweating. Patient Consent Given: Yes Education handout provided: Yes Muscles treated: upper trap L and R Needle size and number:  .30x56mm, 1 Electrical stimulation performed: No Parameters: N/A Treatment response/outcome: Twitch response elicited and Palpable decrease in muscle tension Post-treatment instructions: Patient instructed to expect possible mild to moderate muscle soreness later today and/or tomorrow. Patient instructed in methods to reduce muscle soreness and to continue prescribed HEP. If patient was dry needled over the lung field, patient was instructed on signs and symptoms of pneumothorax and, however unlikely, to see immediate medical attention should they occur. Patient was also educated on signs and symptoms of infection and to seek medical attention should they occur. Patient verbalized understanding of these instructions and education.    [facility] Adult PT Treatment:                                                DATE: 07/03/2021 Therapeutic Exercise: Upper trap stretch x 30 sec BIL Levator scap stretch x 30 sec BIL IR towel stretch x 30 sec BIL Eagle arms x 20 sec BIL Seated Horizontal abduction RTB x10 Seated Diagonals RTB x 10 BIL Standing PNF D2 flexion RTB x 5  BIL Rows RTB x 10 Shoulder extension RTB x 10 Palloff press RTB x 10 Chest fly RTB x 10  Corner pec stretch 30 sec x 10 Manual Therapy: STM, MTPR upper trap     PATIENT EDUCATION:  Education details: *** Mentioned TPDN (pt interested) Update HEP, Eval findings, POC, HEP, proper posture and computer set up c hnadout Person educated: Patient Education method: Explanation, Demonstration, Tactile cues, Verbal cues, and Handouts Education comprehension: verbalized understanding, returned demonstration, verbal cues required, and tactile cues required     HOME EXERCISE PROGRAM: Access Code: [email or web address] URL: [email or web address] Date: 07/03/2021 Prepared by: Harland German  Exercises Seated Upper Trapezius Stretch - 2-3 x daily - 7 x weekly - 1 sets - 3 reps - 10-20 hold Gentle Levator Scapulae Stretch -  2-3 x daily - 7 x weekly - 1 sets - 3 reps - 10-20 hold Added 07/03/2021 Standing Shoulder Horizontal Abduction with Resistance - 1 x daily - 7 x weekly - 2 sets - 10 reps Standing Shoulder Diagonal Horizontal Abduction 60/120 Degrees with Resistance - 1 x daily - 7 x weekly - 2 sets - 10 reps      ASSESSMENT:   CLINICAL IMPRESSION: ***  PT was completed for STM to the cervical paraspinals, upper traps, levators with MTPR to the upper trap. TPDN was then completed to the upper traps bilat and f/b cervical stretching/ROM and posterior chain strengthening. Pt tolerated the session well without adverse effects. Pt will continue to benefit from skilled PT to address pain, strength, and ROM deficits to optimize function and QOL    REHAB POTENTIAL: Fair due to chronicity of pain   CLINICAL DECISION MAKING: Evolving/moderate complexity   EVALUATION COMPLEXITY: Moderate     GOALS:     SHORT TERM GOALS= LTGs     LONG TERM GOALS:    LTG Name Target Date Goal status  1 Pt will be Ind in a final HEP to maintain achieved level of function Baseline: 08/13/21 INITIAL  2 Pt  will voice understanding of measure to assist in pain reduction and management of pain Baseline: 08/13/21 INITIAL  3 Pt will report a decrease in in cervical and upper shoulder pain to 6/10 or less with daily activities Baseline:7-10/10 08/13/21 INITIAL  4 Decrease the number of sign offs from work to 1/week Baseline:2-3/week 08/13/21 INITIAL  5 Pt will be able to demonstrate proper sitting posture to minimize cervical strain Baseline: 08/13/21 INITIAL  6 Improve cervical lat flexion to 35d bilat for improved cervical function Baseline: 08/13/21 INITIAL    PLAN: PT FREQUENCY: 2x/week   PT DURATION: 6 weeks   PLANNED INTERVENTIONS: Therapeutic exercises, Therapeutic activity, Neuro Muscular re-education, Patient/Family education, Joint mobilization, Dry Needling, Electrical stimulation, Cryotherapy, Moist heat, Taping, Traction, Ultrasound, Ionotophoresis 4mg /ml Dexamethasone, and Manual therapy   PLAN FOR NEXT SESSION: *** Assess response to updated HEP and postural ed, discuss TPDN, progress therex as indicated, use of modalities and manual therapy as indicated.   Harland German, [place] 07/10/21 7:58 AM

## 2021-07-14 NOTE — Therapy (Signed)
OUTPATIENT PHYSICAL THERAPY TREATMENT NOTE   Patient Name: Cody Villarreal MRN: [id number] DOB:19-Nov-1985, 36 y.o., male Today's Date: 07/15/2021  PCP: Loyola Mast, PA-C REFERRING PROVIDER: Jeanella Anton, NP      Past Medical History:  Diagnosis Date   HIV disease ([place])    Neuropathy    Patella-femoral syndrome    Sjogren's disease [facility])    Past Surgical History:  Procedure Laterality Date   BACK SURGERY     Patient Active Problem List   Diagnosis Date Noted   Dry mouth and eyes 05/12/2021   ANA positive 08/28/2020   Foreign body sensation in throat 05/15/2020   Cigarette smoker 05/07/2020   Gastroesophageal reflux disease 05/07/2020   Nausea and vomiting 11/25/2016   Viral warts 11/25/2016   Pain of toe of left foot 08/09/2016   Acute allergic rhinitis 03/22/2016   S/P cervical spinal fusion 03/22/2016   Cervical spondylosis with radiculopathy 11/24/2015   Cervical disc herniation 09/24/2015   Neuropathic pain 09/24/2015   Polyneuropathy associated with underlying disease ([place]) 08/13/2015   Pain in both lower extremities 08/06/2015   Nicotine dependence, uncomplicated [id number]   Pain in joint involving lower leg 01/20/2012   Human immunodeficiency virus (HIV) disease ([place]) 10/25/2011    REFERRING DIAG: Cervicalgia  THERAPY DIAG:  No diagnosis found.  PERTINENT HISTORY: HIV, small fibroneuropathy, Sjogren's disease, Hx of cervical fusions  PRECAUTIONS: HIV  ONSET DATE: 2014. 2016  SUBJECTIVE: After TPDN, pt experienced no cervical upper shoulder pain for a day. Pt reports neck pain c intense frontal HA. Pt reports his HEP has been helpful in managing his neck and upper shoulder pain and tightness  PAIN:  Are you having pain? Yes NPRS scale:  neck 6/10, HA 8/10 Pain location: lower neck and upper shoulders, entire body/legs Pain orientation: Bilateral  PAIN TYPE: aching and burning Pain description: constant  Aggravating factors: prolonged  sitting and standing Relieving factors: Sleep, deep squatting, moving, weight through legs    OBJECTIVE:    DIAGNOSTIC FINDINGS:  Xray Cervical 02/15/17 IMPRESSION: Postoperative change with support hardware intact. Areas of mild osteoarthritic change. No fracture or spondylolisthesis. Scoliosis. Reversal lordotic curvature is present, a finding most likely indicative of a degree of chronic muscle spasm.   Xray lumbar 02/15/17 IMPRESSION: Scoliosis. No fracture or spondylolisthesis. No appreciable arthropathy.     COGNITION: Overall cognitive status: Within functional limits for tasks assessed            SENSATION: Light touch: Appears intact   POSTURE:  Forward head, rounded shoulders, ant. pelvic tilt, scoliosis with R rib hump   PALPATION: TTP c increased muscle tension of the upper trap, levator and cervical paraspinals          CERVICAL AROM/PROM   A/PROM A/PROM (deg) 06/23/2021  Flexion 47, post necking c pulling pain  Extension 80, min pain  Right lateral flexion 21, pulling pain L  Left lateral flexion 25. Pulling pain R  Right rotation 62, pulling pain L  Left rotation 63, pulling pain R   (Blank rows = not tested)   UE AROM/PROM:   Grossly WNLs   UE MMT:             UE myotomal screen neg   CERVICAL SPECIAL TESTS:  Spurling's test: Negative     FUNCTIONAL TESTS:  5 times sit to stand: TBA 2 minute walk test: TBA    [name]:  DATE: 07/15/21 Therapeutic Exercise: Following manual therapy Cervical retraction x10, 3 sec Cervical rotation c retraction x3, 5 sec, Upper trap stretch 2 x 30 sec BIL Levator scap stretch 2 x 30 sec BIL Cervical rotation 2 x30 sec BIL  Manual Therapy: STM to the upper traps, levator, and sub-occipitals f/b TPMR of the upper traps and sub-occipital release. Instructed pt in IASTM to the sub-occipital area c tennis balls Self Care: IASTM c tennis balls     TODAY'S TREATMENT:  [facility] Adult PT Treatment:                                                DATE: 07/08/2021 Therapeutic Exercise: Upper trap stretch 2 x 30 sec BIL Levator scap stretch 2 x 30 sec BIL Cervical rotation 2 x30 sec BIL Seated Horizontal abduction RTB 2x10 Seated ER RTB 2x10 Cervical retraction x10, 3 sec Corner pec stretch 30 sec 2 x 10 Manual Therapy: STM to the cervical paraspinals, upper traps and levator, MTPR upper trap.   Trigger Point Dry Needling Treatment: Skilled palpation for the identification of TP. Increased muscle tension and more tender TP of the R upper trap Pre-treatment instruction: Patient instructed on dry needling rationale, procedures, and possible side effects including pain during treatment (achy,cramping feeling), bruising, drop of blood, lightheadedness, nausea, sweating. Patient Consent Given: Yes Education handout provided: Yes Muscles treated: upper trap L and R Needle size and number:  .30x45mm, 1 Electrical stimulation performed: No Parameters: N/A Treatment response/outcome: Twitch response elicited and Palpable decrease in muscle tension Post-treatment instructions: Patient instructed to expect possible mild to moderate muscle soreness later today and/or tomorrow. Patient instructed in methods to reduce muscle soreness and to continue prescribed HEP. If patient was dry needled over the lung field, patient was instructed on signs and symptoms of pneumothorax and, however unlikely, to see immediate medical attention should they occur. Patient was also educated on signs and symptoms of infection and to seek medical attention should they occur. Patient verbalized understanding of these instructions and education.    [facility] Adult PT Treatment:                                                DATE: 07/03/2021 Therapeutic Exercise: Upper trap stretch x 30 sec BIL Levator scap stretch x 30 sec BIL IR towel stretch x 30 sec BIL Eagle arms x 20 sec  BIL Seated Horizontal abduction RTB x10 Seated Diagonals RTB x 10 BIL Standing PNF D2 flexion RTB x 5 BIL Rows RTB x 10 Shoulder extension RTB x 10 Palloff press RTB x 10 Chest fly RTB x 10  Corner pec stretch 30 sec x 10 Manual Therapy: STM, MTPR upper trap   [name]:                                                DATE: 06/23/2021 Upper trap stretch x2, 20 sec Levator stretch x2, 20 sec     PATIENT EDUCATION:  Education details: Mentioned TPDN (pt interested) Update HEP, Eval findings, POC, HEP, proper posture and computer  set up c hnadout Person educated: Patient Education method: Explanation, Demonstration, Tactile cues, Verbal cues, and Handouts Education comprehension: verbalized understanding, returned demonstration, verbal cues required, and tactile cues required     HOME EXERCISE PROGRAM: Access Code: [email or web address] URL: [email or web address] Date: 07/03/2021 Prepared by: Evelene Croon  Exercises Seated Upper Trapezius Stretch - 2-3 x daily - 7 x weekly - 1 sets - 3 reps - 10-20 hold Gentle Levator Scapulae Stretch - 2-3 x daily - 7 x weekly - 1 sets - 3 reps - 10-20 hold Added 07/03/2021 Standing Shoulder Horizontal Abduction with Resistance - 1 x daily - 7 x weekly - 2 sets - 10 reps Standing Shoulder Diagonal Horizontal Abduction 60/120 Degrees with Resistance - 1 x daily - 7 x weekly - 2 sets - 10 reps      ASSESSMENT:   CLINICAL IMPRESSION: PT was completed for manual therapy to the pt's upper shoulder and neck. Manual therapy was was f/b stretches to this area. Following the session, pt reported his HA was significantly decreased. Pt will continue to benefit from skilled PT to address cervical pain, posture, and ROM deficits to optimize function and QOL    REHAB POTENTIAL: Fair due to chronicity of pain   CLINICAL DECISION MAKING: Evolving/moderate complexity   EVALUATION COMPLEXITY: Moderate     GOALS:     SHORT TERM GOALS=  LTGs     LONG TERM GOALS:    LTG Name Target Date Goal status  1 Pt will be Ind in a final HEP to maintain achieved level of function Baseline: 08/13/21 INITIAL  2 Pt will voice understanding of measure to assist in pain reduction and management of pain Baseline: 08/13/21 INITIAL  3 Pt will report a decrease in in cervical and upper shoulder pain to 6/10 or less with daily activities Baseline:7-10/10 08/13/21 INITIAL  4 Decrease the number of sign offs from work to 1/week Baseline:2-3/week 08/13/21 INITIAL  5 Pt will be able to demonstrate proper sitting posture to minimize cervical strain Baseline: 08/13/21 INITIAL  6 Improve cervical lat flexion to 35d bilat for improved cervical function Baseline: 08/13/21 INITIAL    PLAN: PT FREQUENCY: 2x/week   PT DURATION: 6 weeks   PLANNED INTERVENTIONS: Therapeutic exercises, Therapeutic activity, Neuro Muscular re-education, Patient/Family education, Joint mobilization, Dry Needling, Electrical stimulation, Cryotherapy, Moist heat, Taping, Traction, Ultrasound, Ionotophoresis 4mg /ml Dexamethasone, and Manual therapy   PLAN FOR NEXT SESSION: Assess response to updated HEP and postural ed, discuss TPDN, progress therex as indicated, use of modalities and manual therapy as indicated.   Aireona Torelli MS, PT 07/15/21 9:45 PM

## 2021-07-15 ENCOUNTER — Ambulatory Visit: Payer: Medicaid Other

## 2021-07-15 ENCOUNTER — Other Ambulatory Visit: Payer: Self-pay

## 2021-07-15 DIAGNOSIS — M542 Cervicalgia: Secondary | ICD-10-CM

## 2021-07-15 DIAGNOSIS — R252 Cramp and spasm: Secondary | ICD-10-CM

## 2021-07-17 ENCOUNTER — Ambulatory Visit: Payer: Medicaid Other

## 2021-07-17 ENCOUNTER — Other Ambulatory Visit: Payer: Self-pay

## 2021-07-17 DIAGNOSIS — M542 Cervicalgia: Secondary | ICD-10-CM

## 2021-07-17 DIAGNOSIS — R252 Cramp and spasm: Secondary | ICD-10-CM

## 2021-07-17 NOTE — Therapy (Addendum)
OUTPATIENT PHYSICAL THERAPY TREATMENT NOTE/Discharge   Patient Name: Cody Villarreal MRN: [id number] DOB:01-25-1986, 36 y.o., male Today's Date: 07/17/2021  PCP: Loyola Mast, PA-C REFERRING PROVIDER: Jeanella Anton, NP   PT End of Session - 07/17/21 0959     Visit Number 5    Number of Visits 13    Date for PT Re-Evaluation 08/13/21    Authorization Type [place] MEDICAID HEALTHY BLUE    Progress Note Due on Visit 10    PT Start Time 1000    PT Stop Time 1030    PT Time Calculation (min) 30 min    Activity Tolerance Patient tolerated treatment well    Behavior During Therapy WFL for tasks assessed/performed               Past Medical History:  Diagnosis Date   HIV disease ([place])    Neuropathy    Patella-femoral syndrome    Sjogren's disease ([place])    Past Surgical History:  Procedure Laterality Date   BACK SURGERY     Patient Active Problem List   Diagnosis Date Noted   Dry mouth and eyes 05/12/2021   ANA positive 08/28/2020   Foreign body sensation in throat 05/15/2020   Cigarette smoker 05/07/2020   Gastroesophageal reflux disease 05/07/2020   Nausea and vomiting 11/25/2016   Viral warts 11/25/2016   Pain of toe of left foot 08/09/2016   Acute allergic rhinitis 03/22/2016   S/P cervical spinal fusion 03/22/2016   Cervical spondylosis with radiculopathy 11/24/2015   Cervical disc herniation 09/24/2015   Neuropathic pain 09/24/2015   Polyneuropathy associated with underlying disease ([place]) 08/13/2015   Pain in both lower extremities 08/06/2015   Nicotine dependence, uncomplicated 51/06/5850   Pain in joint involving lower leg 01/20/2012   Human immunodeficiency virus (HIV) disease ([place]) 10/25/2011    REFERRING DIAG: Cervicalgia  THERAPY DIAG:  Cervicalgia  Cramp and spasm  PERTINENT HISTORY: HIV, small fibroneuropathy, Sjogren's disease, Hx of cervical fusions  PRECAUTIONS: HIV  ONSET DATE: 2014. 2016  SUBJECTIVE: I am having a cramp in the  left side of my neck, probably because I fell asleep sitting up watching TV.  PAIN:  Are you having pain? Yes NPRS scale:  neck 8/10, HA 0/10 Pain location: lower neck and upper shoulders, entire body/legs Pain orientation: Bilateral  PAIN TYPE: aching and burning Pain description: constant  Aggravating factors: prolonged sitting and standing Relieving factors: Sleep, deep squatting, moving, weight through legs    OBJECTIVE:    DIAGNOSTIC FINDINGS:  Xray Cervical 02/15/17 IMPRESSION: Postoperative change with support hardware intact. Areas of mild osteoarthritic change. No fracture or spondylolisthesis. Scoliosis. Reversal lordotic curvature is present, a finding most likely indicative of a degree of chronic muscle spasm.   Xray lumbar 02/15/17 IMPRESSION: Scoliosis. No fracture or spondylolisthesis. No appreciable arthropathy.     COGNITION: Overall cognitive status: Within functional limits for tasks assessed            SENSATION: Light touch: Appears intact   POSTURE:  Forward head, rounded shoulders, ant. pelvic tilt, scoliosis with R rib hump   PALPATION: TTP c increased muscle tension of the upper trap, levator and cervical paraspinals          CERVICAL AROM/PROM   A/PROM A/PROM (deg) 06/23/2021  Flexion 47, post necking c pulling pain  Extension 80, min pain  Right lateral flexion 21, pulling pain L  Left lateral flexion 25. Pulling pain R  Right rotation 62, pulling pain L  Left rotation 63, pulling pain R   (Blank rows = not tested)   UE AROM/PROM:   Grossly WNLs   UE MMT:             UE myotomal screen neg   CERVICAL SPECIAL TESTS:  Spurling's test: Negative     FUNCTIONAL TESTS:  5 times sit to stand: TBA 2 minute walk test: TBA   TODAY'S TREATMENT:  [facility] Adult PT Treatment:                                                DATE: 07/17/2021 Therapeutic Exercise: Upper trap stretch 2 x 30 sec BIL Levator scap stretch 2 x 30 sec BIL Seated  ER RTB 2x10  Corner pec stretch 2 x 30" Seated Diagonals RTB x 10 BIL Manual Therapy: STM and MTPR to bilateral upper traps x 8 min Self Care: Tennis ball and theracane x 8 mins   [name]:                                                DATE: 07/15/21 Therapeutic Exercise: Following manual therapy Cervical retraction x10, 3 sec Cervical rotation c retraction x3, 5 sec, Upper trap stretch 2 x 30 sec BIL Levator scap stretch 2 x 30 sec BIL Cervical rotation 2 x30 sec BIL  Manual Therapy: STM to the upper traps, levator, and sub-occipitals f/b TPMR of the upper traps and sub-occipital release. Instructed pt in IASTM to the sub-occipital area c tennis balls Self Care: IASTM c tennis balls    [facility] Adult PT Treatment:                                                DATE: 07/08/2021 Therapeutic Exercise: Upper trap stretch 2 x 30 sec BIL Levator scap stretch 2 x 30 sec BIL Cervical rotation 2 x30 sec BIL Seated Horizontal abduction RTB 2x10 Seated ER RTB 2x10 Cervical retraction x10, 3 sec Corner pec stretch 30 sec 2 x 10 Manual Therapy: STM to the cervical paraspinals, upper traps and levator, MTPR upper trap.   Trigger Point Dry Needling Treatment: Skilled palpation for the identification of TP. Increased muscle tension and more tender TP of the R upper trap Pre-treatment instruction: Patient instructed on dry needling rationale, procedures, and possible side effects including pain during treatment (achy,cramping feeling), bruising, drop of blood, lightheadedness, nausea, sweating. Patient Consent Given: Yes Education handout provided: Yes Muscles treated: upper trap L and R Needle size and number:  .30x15mm, 1 Electrical stimulation performed: No Parameters: N/A Treatment response/outcome: Twitch response elicited and Palpable decrease in muscle tension Post-treatment instructions: Patient instructed to expect possible mild to moderate muscle soreness later today  and/or tomorrow. Patient instructed in methods to reduce muscle soreness and to continue prescribed HEP. If patient was dry needled over the lung field, patient was instructed on signs and symptoms of pneumothorax and, however unlikely, to see immediate medical attention should they occur. Patient was also educated on signs and symptoms of infection and to seek medical attention should they occur. Patient verbalized  understanding of these instructions and education.        PATIENT EDUCATION:  Education details: Mentioned TPDN (pt interested) Update HEP, Eval findings, POC, HEP, proper posture and computer set up c hnadout Person educated: Patient Education method: Explanation, Demonstration, Tactile cues, Verbal cues, and Handouts Education comprehension: verbalized understanding, returned demonstration, verbal cues required, and tactile cues required     HOME EXERCISE PROGRAM: Access Code: [email or web address] URL: [email or web address] Date: 07/03/2021 Prepared by: Evelene Croon  Exercises Seated Upper Trapezius Stretch - 2-3 x daily - 7 x weekly - 1 sets - 3 reps - 10-20 hold Gentle Levator Scapulae Stretch - 2-3 x daily - 7 x weekly - 1 sets - 3 reps - 10-20 hold Added 07/03/2021 Standing Shoulder Horizontal Abduction with Resistance - 1 x daily - 7 x weekly - 2 sets - 10 reps Standing Shoulder Diagonal Horizontal Abduction 60/120 Degrees with Resistance - 1 x daily - 7 x weekly - 2 sets - 10 reps      ASSESSMENT:   CLINICAL IMPRESSION: Patient arrived 15 minutes late to appointment, delaying treatment time. He presents with a high level of neck pain on L side citing sleeping upright in a chair after falling asleep watching television. Post manual STM and MTPR patient reported decreased tension and pain in neck and L upper trap. Patient is interested in additional TPDN next session. Patient continues to benefit from skilled PT services and should be progressed as able to improve  functional independence.     REHAB POTENTIAL: Fair due to chronicity of pain   CLINICAL DECISION MAKING: Evolving/moderate complexity   EVALUATION COMPLEXITY: Moderate     GOALS:     SHORT TERM GOALS= LTGs     LONG TERM GOALS:    LTG Name Target Date Goal status  1 Pt will be Ind in a final HEP to maintain achieved level of function Baseline: 08/13/21 INITIAL  2 Pt will voice understanding of measure to assist in pain reduction and management of pain Baseline: 08/13/21 INITIAL  3 Pt will report a decrease in in cervical and upper shoulder pain to 6/10 or less with daily activities Baseline:7-10/10 08/13/21 INITIAL  4 Decrease the number of sign offs from work to 1/week Baseline:2-3/week 08/13/21 INITIAL  5 Pt will be able to demonstrate proper sitting posture to minimize cervical strain Baseline: 08/13/21 INITIAL  6 Improve cervical lat flexion to 35d bilat for improved cervical function Baseline: 08/13/21 INITIAL    PLAN: PT FREQUENCY: 2x/week   PT DURATION: 6 weeks   PLANNED INTERVENTIONS: Therapeutic exercises, Therapeutic activity, Neuro Muscular re-education, Patient/Family education, Joint mobilization, Dry Needling, Electrical stimulation, Cryotherapy, Moist heat, Taping, Traction, Ultrasound, Ionotophoresis 4mg /ml Dexamethasone, and Manual therapy   PLAN FOR NEXT SESSION: Assess response to updated HEP and postural ed, discuss TPDN, progress therex as indicated, use of modalities and manual therapy as indicated.   Evelene Croon, PTA 07/17/21 10:58 AM  PHYSICAL THERAPY DISCHARGE SUMMARY  Visits from Start of Care: 5  Current functional level related to goals / functional outcomes: Unknown, pt stopped attending PT   Remaining deficits: Unknown, pt stopped attending PT   Education / Equipment: HEP   Patient agrees to discharge. Patient goals were not met. Patient is being discharged due to not returning since the last visit.   Allen Ralls MS,  PT 11/09/21 7:51 AM

## 2021-07-19 DIAGNOSIS — B20 Human immunodeficiency virus [HIV] disease: Principal | ICD-10-CM

## 2021-07-19 MED ORDER — GENVOYA 150 MG-150 MG-200 MG-10 MG TABLET
ORAL_TABLET | Freq: Every day | ORAL | 0 refills | 90 days | Status: CP
Start: 2021-07-19 — End: ?

## 2021-07-21 NOTE — Therapy (Incomplete)
OUTPATIENT PHYSICAL THERAPY TREATMENT NOTE   Patient Name: Cody Villarreal MRN: [id number] DOB:01/24/1986, 36 y.o., male Today's Date: 07/21/2021  PCP: Bryon Lions, PA-C REFERRING PROVIDER: Merryl Hacker, NP       Past Medical History:  Diagnosis Date   HIV disease ([facility])    Neuropathy    Patella-femoral syndrome    Sjogren's disease [facility])    Past Surgical History:  Procedure Laterality Date   BACK SURGERY     Patient Active Problem List   Diagnosis Date Noted   Dry mouth and eyes 05/12/2021   ANA positive 08/28/2020   Foreign body sensation in throat 05/15/2020   Cigarette smoker 05/07/2020   Gastroesophageal reflux disease 05/07/2020   Nausea and vomiting 11/25/2016   Viral warts 11/25/2016   Pain of toe of left foot 08/09/2016   Acute allergic rhinitis 03/22/2016   S/P cervical spinal fusion 03/22/2016   Cervical spondylosis with radiculopathy 11/24/2015   Cervical disc herniation 09/24/2015   Neuropathic pain 09/24/2015   Polyneuropathy associated with underlying disease ([facility]) 08/13/2015   Pain in both lower extremities 08/06/2015   Nicotine dependence, uncomplicated 09/18/2013   Pain in joint involving lower leg 01/20/2012   Human immunodeficiency virus (HIV) disease ([facility]) 10/25/2011    REFERRING DIAG: Cervicalgia  THERAPY DIAG:  No diagnosis found.  PERTINENT HISTORY: HIV, small fibroneuropathy, Sjogren's disease, Hx of cervical fusions  PRECAUTIONS: HIV  ONSET DATE: 2014. 2016  SUBJECTIVE: I am having a cramp in the left side of my neck, probably because I fell asleep sitting up watching TV.  PAIN:  Are you having pain? Yes NPRS scale:  neck 8/10, HA 0/10 Pain location: lower neck and upper shoulders, entire body/legs Pain orientation: Bilateral  PAIN TYPE: aching and burning Pain description: constant  Aggravating factors: prolonged sitting and standing Relieving factors: Sleep, deep squatting, moving, weight through  legs    OBJECTIVE:    DIAGNOSTIC FINDINGS:  Xray Cervical 02/15/17 IMPRESSION: Postoperative change with support hardware intact. Areas of mild osteoarthritic change. No fracture or spondylolisthesis. Scoliosis. Reversal lordotic curvature is present, a finding most likely indicative of a degree of chronic muscle spasm.   Xray lumbar 02/15/17 IMPRESSION: Scoliosis. No fracture or spondylolisthesis. No appreciable arthropathy.     COGNITION: Overall cognitive status: Within functional limits for tasks assessed            SENSATION: Light touch: Appears intact   POSTURE:  Forward head, rounded shoulders, ant. pelvic tilt, scoliosis with R rib hump   PALPATION: TTP c increased muscle tension of the upper trap, levator and cervical paraspinals          CERVICAL AROM/PROM   A/PROM A/PROM (deg) 06/23/2021  Flexion 47, post necking c pulling pain  Extension 80, min pain  Right lateral flexion 21, pulling pain L  Left lateral flexion 25. Pulling pain R  Right rotation 62, pulling pain L  Left rotation 63, pulling pain R   (Blank rows = not tested)   UE AROM/PROM:   Grossly WNLs   UE MMT:             UE myotomal screen neg   CERVICAL SPECIAL TESTS:  Spurling's test: Negative     FUNCTIONAL TESTS:  5 times sit to stand: TBA 2 minute walk test: TBA   TODAY'S TREATMENT:  [facility] Adult PT Treatment:  DATE: 07/22/21 Therapeutic Exercise: *** Manual Therapy: *** Neuromuscular re-ed: *** Therapeutic Activity: *** Modalities: *** Self Care: ***   [name]:                                                DATE: 07/17/2021 Therapeutic Exercise: Upper trap stretch 2 x 30 sec BIL Levator scap stretch 2 x 30 sec BIL Seated ER RTB 2x10  Corner pec stretch 2 x 30" Seated Diagonals RTB x 10 BIL Manual Therapy: STM and MTPR to bilateral upper traps x 8 min Self Care: Tennis ball and theracane x 8  mins   [name]:                                                DATE: 07/15/21 Therapeutic Exercise: Following manual therapy Cervical retraction x10, 3 sec Cervical rotation c retraction x3, 5 sec, Upper trap stretch 2 x 30 sec BIL Levator scap stretch 2 x 30 sec BIL Cervical rotation 2 x30 sec BIL  Manual Therapy: STM to the upper traps, levator, and sub-occipitals f/b TPMR of the upper traps and sub-occipital release. Instructed pt in IASTM to the sub-occipital area c tennis balls Self Care: IASTM c tennis balls    [name] Adult PT Treatment:                                                DATE: 07/08/2021 Therapeutic Exercise: Upper trap stretch 2 x 30 sec BIL Levator scap stretch 2 x 30 sec BIL Cervical rotation 2 x30 sec BIL Seated Horizontal abduction RTB 2x10 Seated ER RTB 2x10 Cervical retraction x10, 3 sec Corner pec stretch 30 sec 2 x 10 Manual Therapy: STM to the cervical paraspinals, upper traps and levator, MTPR upper trap.   Trigger Point Dry Needling Treatment: Skilled palpation for the identification of TP. Increased muscle tension and more tender TP of the R upper trap Pre-treatment instruction: Patient instructed on dry needling rationale, procedures, and possible side effects including pain during treatment (achy,cramping feeling), bruising, drop of blood, lightheadedness, nausea, sweating. Patient Consent Given: Yes Education handout provided: Yes Muscles treated: upper trap L and R Needle size and number:  .30x49mm, 1 Electrical stimulation performed: No Parameters: N/A Treatment response/outcome: Twitch response elicited and Palpable decrease in muscle tension Post-treatment instructions: Patient instructed to expect possible mild to moderate muscle soreness later today and/or tomorrow. Patient instructed in methods to reduce muscle soreness and to continue prescribed HEP. If patient was dry needled over the lung field, patient was instructed on  signs and symptoms of pneumothorax and, however unlikely, to see immediate medical attention should they occur. Patient was also educated on signs and symptoms of infection and to seek medical attention should they occur. Patient verbalized understanding of these instructions and education.        PATIENT EDUCATION:  Education details: Mentioned TPDN (pt interested) Update HEP, Eval findings, POC, HEP, proper posture and computer set up c hnadout Person educated: Patient Education method: Explanation, Demonstration, Tactile cues, Verbal cues, and Handouts Education comprehension: verbalized  understanding, returned demonstration, verbal cues required, and tactile cues required     HOME EXERCISE PROGRAM: Access Code: [email or web address] URL: [email or web address] Date: 07/03/2021 Prepared by: Harland German  Exercises Seated Upper Trapezius Stretch - 2-3 x daily - 7 x weekly - 1 sets - 3 reps - 10-20 hold Gentle Levator Scapulae Stretch - 2-3 x daily - 7 x weekly - 1 sets - 3 reps - 10-20 hold Added 07/03/2021 Standing Shoulder Horizontal Abduction with Resistance - 1 x daily - 7 x weekly - 2 sets - 10 reps Standing Shoulder Diagonal Horizontal Abduction 60/120 Degrees with Resistance - 1 x daily - 7 x weekly - 2 sets - 10 reps      ASSESSMENT:   CLINICAL IMPRESSION: Patient arrived 15 minutes late to appointment, delaying treatment time. He presents with a high level of neck pain on L side citing sleeping upright in a chair after falling asleep watching television. Post manual STM and MTPR patient reported decreased tension and pain in neck and L upper trap. Patient is interested in additional TPDN next session. Patient continues to benefit from skilled PT services and should be progressed as able to improve functional independence.     REHAB POTENTIAL: Fair due to chronicity of pain   CLINICAL DECISION MAKING: Evolving/moderate complexity   EVALUATION COMPLEXITY: Moderate      GOALS:     SHORT TERM GOALS= LTGs     LONG TERM GOALS:    LTG Name Target Date Goal status  1 Pt will be Ind in a final HEP to maintain achieved level of function Baseline: 08/13/21 INITIAL  2 Pt will voice understanding of measure to assist in pain reduction and management of pain Baseline: 08/13/21 INITIAL  3 Pt will report a decrease in in cervical and upper shoulder pain to 6/10 or less with daily activities Baseline:7-10/10 08/13/21 INITIAL  4 Decrease the number of sign offs from work to 1/week Baseline:2-3/week 08/13/21 INITIAL  5 Pt will be able to demonstrate proper sitting posture to minimize cervical strain Baseline: 08/13/21 INITIAL  6 Improve cervical lat flexion to 35d bilat for improved cervical function Baseline: 08/13/21 INITIAL    PLAN: PT FREQUENCY: 2x/week   PT DURATION: 6 weeks   PLANNED INTERVENTIONS: Therapeutic exercises, Therapeutic activity, Neuro Muscular re-education, Patient/Family education, Joint mobilization, Dry Needling, Electrical stimulation, Cryotherapy, Moist heat, Taping, Traction, Ultrasound, Ionotophoresis 4mg /ml Dexamethasone, and Manual therapy   PLAN FOR NEXT SESSION: Assess response to updated HEP and postural ed, discuss TPDN, progress therex as indicated, use of modalities and manual therapy as indicated.

## 2021-07-22 ENCOUNTER — Ambulatory Visit: Payer: Medicaid Other

## 2021-07-24 ENCOUNTER — Ambulatory Visit: Payer: Medicaid Other

## 2021-07-24 ENCOUNTER — Telehealth: Payer: Self-pay

## 2021-07-24 NOTE — Therapy (Incomplete)
OUTPATIENT PHYSICAL THERAPY TREATMENT NOTE   Patient Name: Cody Villarreal MRN: [id number] DOB:08/23/1985, 36 y.o., male Today's Date: 07/24/2021  PCP: Bryon Lions, PA-C REFERRING PROVIDER: Merryl Hacker, NP       Past Medical History:  Diagnosis Date   HIV disease (HCC)    Neuropathy    Patella-femoral syndrome    Sjogren's disease [facility])    Past Surgical History:  Procedure Laterality Date   BACK SURGERY     Patient Active Problem List   Diagnosis Date Noted   Dry mouth and eyes 05/12/2021   ANA positive 08/28/2020   Foreign body sensation in throat 05/15/2020   Cigarette smoker 05/07/2020   Gastroesophageal reflux disease 05/07/2020   Nausea and vomiting 11/25/2016   Viral warts 11/25/2016   Pain of toe of left foot 08/09/2016   Acute allergic rhinitis 03/22/2016   S/P cervical spinal fusion 03/22/2016   Cervical spondylosis with radiculopathy 11/24/2015   Cervical disc herniation 09/24/2015   Neuropathic pain 09/24/2015   Polyneuropathy associated with underlying disease (HCC) 08/13/2015   Pain in both lower extremities 08/06/2015   Nicotine dependence, uncomplicated 09/18/2013   Pain in joint involving lower leg 01/20/2012   Human immunodeficiency virus (HIV) disease (HCC) 10/25/2011    REFERRING DIAG: Cervicalgia  THERAPY DIAG:  No diagnosis found.  PERTINENT HISTORY: HIV, small fibroneuropathy, Sjogren's disease, Hx of cervical fusions  PRECAUTIONS: HIV  ONSET DATE: 2014. 2016  SUBJECTIVE: *** I am having a cramp in the left side of my neck, probably because I fell asleep sitting up watching TV.  PAIN:  Are you having pain? Yes NPRS scale:  neck ***/10, HA ***/10 Pain location: lower neck and upper shoulders, entire body/legs Pain orientation: Bilateral  PAIN TYPE: aching and burning Pain description: constant  Aggravating factors: prolonged sitting and standing Relieving factors: Sleep, deep squatting, moving, weight through  legs    OBJECTIVE:    DIAGNOSTIC FINDINGS:  Xray Cervical 02/15/17 IMPRESSION: Postoperative change with support hardware intact. Areas of mild osteoarthritic change. No fracture or spondylolisthesis. Scoliosis. Reversal lordotic curvature is present, a finding most likely indicative of a degree of chronic muscle spasm.   Xray lumbar 02/15/17 IMPRESSION: Scoliosis. No fracture or spondylolisthesis. No appreciable arthropathy.     COGNITION: Overall cognitive status: Within functional limits for tasks assessed            SENSATION: Light touch: Appears intact   POSTURE:  Forward head, rounded shoulders, ant. pelvic tilt, scoliosis with R rib hump   PALPATION: TTP c increased muscle tension of the upper trap, levator and cervical paraspinals          CERVICAL AROM/PROM   A/PROM A/PROM (deg) 06/23/2021  Flexion 47, post necking c pulling pain  Extension 80, min pain  Right lateral flexion 21, pulling pain L  Left lateral flexion 25. Pulling pain R  Right rotation 62, pulling pain L  Left rotation 63, pulling pain R   (Blank rows = not tested)   UE AROM/PROM:   Grossly WNLs   UE MMT:             UE myotomal screen neg   CERVICAL SPECIAL TESTS:  Spurling's test: Negative     FUNCTIONAL TESTS:  5 times sit to stand: TBA 2 minute walk test: TBA   TODAY'S TREATMENT:  [facility] Adult PT Treatment:  DATE: 07/24/2021 Therapeutic Exercise: UBE level 4 2.5 mins forward/retro  Upper trap stretch 2 x 30 sec BIL Levator scap stretch 2 x 30 sec BIL Seated ER GTB 2x10  Corner pec stretch 2 x 30" Rows at [facility] with bar 17# 2 x 10 Palloff press at FM 10# x 10 BIL Chops at [facility] 7# x 10 BIL Reverse chops at FM 7# x 10 BIL Prone ITWY's  Manual Therapy: STM and MTPR to bilateral upper traps x 8 min   [name]:                                                DATE: 07/17/2021 Therapeutic Exercise: Upper trap stretch 2  x 30 sec BIL Levator scap stretch 2 x 30 sec BIL Seated ER RTB 2x10  Corner pec stretch 2 x 30" Seated Diagonals RTB x 10 BIL Manual Therapy: STM and MTPR to bilateral upper traps x 8 min Self Care: Tennis ball and theracane x 8 mins   [name]:                                                DATE: 07/15/21 Therapeutic Exercise: Following manual therapy Cervical retraction x10, 3 sec Cervical rotation c retraction x3, 5 sec, Upper trap stretch 2 x 30 sec BIL Levator scap stretch 2 x 30 sec BIL Cervical rotation 2 x30 sec BIL  Manual Therapy: STM to the upper traps, levator, and sub-occipitals f/b TPMR of the upper traps and sub-occipital release. Instructed pt in IASTM to the sub-occipital area c tennis balls Self Care: IASTM c tennis balls        PATIENT EDUCATION:  Education details: Mentioned TPDN (pt interested) Update HEP, Eval findings, POC, HEP, proper posture and computer set up c hnadout Person educated: Patient Education method: Explanation, Demonstration, Tactile cues, Verbal cues, and Handouts Education comprehension: verbalized understanding, returned demonstration, verbal cues required, and tactile cues required     HOME EXERCISE PROGRAM: Access Code: [email or web address] URL: [email or web address] Date: 07/03/2021 Prepared by: Harland German  Exercises Seated Upper Trapezius Stretch - 2-3 x daily - 7 x weekly - 1 sets - 3 reps - 10-20 hold Gentle Levator Scapulae Stretch - 2-3 x daily - 7 x weekly - 1 sets - 3 reps - 10-20 hold Added 07/03/2021 Standing Shoulder Horizontal Abduction with Resistance - 1 x daily - 7 x weekly - 2 sets - 10 reps Standing Shoulder Diagonal Horizontal Abduction 60/120 Degrees with Resistance - 1 x daily - 7 x weekly - 2 sets - 10 reps      ASSESSMENT:   CLINICAL IMPRESSION: ***  Patient arrived 15 minutes late to appointment, delaying treatment time. He presents with a high level of neck pain on L side  citing sleeping upright in a chair after falling asleep watching television. Post manual STM and MTPR patient reported decreased tension and pain in neck and L upper trap. Patient is interested in additional TPDN next session. Patient continues to benefit from skilled PT services and should be progressed as able to improve functional independence.     REHAB POTENTIAL: Fair due to chronicity of pain   CLINICAL DECISION MAKING: Evolving/moderate complexity  EVALUATION COMPLEXITY: Moderate     GOALS:     SHORT TERM GOALS= LTGs     LONG TERM GOALS:    LTG Name Target Date Goal status  1 Pt will be Ind in a final HEP to maintain achieved level of function Baseline: 08/13/21 INITIAL  2 Pt will voice understanding of measure to assist in pain reduction and management of pain Baseline: 08/13/21 INITIAL  3 Pt will report a decrease in in cervical and upper shoulder pain to 6/10 or less with daily activities Baseline:7-10/10 08/13/21 INITIAL  4 Decrease the number of sign offs from work to 1/week Baseline:2-3/week 08/13/21 INITIAL  5 Pt will be able to demonstrate proper sitting posture to minimize cervical strain Baseline: 08/13/21 INITIAL  6 Improve cervical lat flexion to 35d bilat for improved cervical function Baseline: 08/13/21 INITIAL    PLAN: PT FREQUENCY: 2x/week   PT DURATION: 6 weeks   PLANNED INTERVENTIONS: Therapeutic exercises, Therapeutic activity, Neuro Muscular re-education, Patient/Family education, Joint mobilization, Dry Needling, Electrical stimulation, Cryotherapy, Moist heat, Taping, Traction, Ultrasound, Ionotophoresis 4mg /ml Dexamethasone, and Manual therapy   PLAN FOR NEXT SESSION: Assess response to updated HEP and postural ed, discuss TPDN, progress therex as indicated, use of modalities and manual therapy as indicated.   Harland German, PTA 07/24/21 8:06 AM

## 2021-07-24 NOTE — Telephone Encounter (Signed)
Left voicemail regarding missed appointment. No follow up appointments scheduled at this time, patient encouraged to call and set up future appointments. Reminded of attendance policy.

## 2021-07-29 ENCOUNTER — Ambulatory Visit: Payer: Medicaid Other

## 2021-08-05 ENCOUNTER — Ambulatory Visit: Payer: Medicaid Other

## 2021-08-11 NOTE — Progress Notes (Signed)
? ?Office Visit Note ? ?Patient: Cody Villarreal             ?Date of Birth: [birth year]           ?MRN: [id number]             ?PCP: Loyola Mast, PA-C ?Referring: Loyola Mast, PA-C ?Visit Date: 08/12/2021 ? ? ?Subjective:  ?Follow-up (Doing good) ? ? ?History of Present Illness: Cody Villarreal is a 36 y.o. male here for follow up for suspected sjogren's syndrome with multiple joint pains also skin rashes and some neuropathy symptoms. Labs at initial visit in December showing very slight sed rate elevation. He is doing overall well he switching to using a gel based eye drops which are helping more for dryness and irritation. PT for neck pain was partially helpful but still having a lot of trouble again. He is planning for MRI to assess this with his history of cervical disc herniation. He noticed some burning discomfort coming and going in the right foot toes. Usually lasing a few minutes at a time and no visible changes. ? ?Previous HPI ?05/12/21 ?Cody Villarreal is a 36 y.o. male here for evaluation of chronic pains and positive ANA with probable sjogrens syndrome. Medical history is significant for well controlled HIV genvoya. He sees pain management with [facility] medical center and has seen [facility] neurosurgery for cervical spine herniation treatment. C3-4 fixation in 2015 with revision in 2017.  He has previously seen rheumatology in 2020 for evaluation of symptoms with positive ANA suspected as Sjogren's syndrome no immunosuppressive treatment started pain management for joint and body pains.  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  He has been having increased pain and bilateral upper extremities and in the knees for the past 6 months.  These particularly been worse after presumed COVID infection in September.  His children at home were sick with positive test though he never had a personal positive COVID antibody test.   Symptoms took about 2 months to resolve but he is continued having increased joint pains and fatigue.  He has noticed increase in bilateral ankle swelling slightly worse on the left side.  He has skin peeling with dryness and itching on the thighs and ankles of both legs.  He had some skin rash and symptoms involving the hands this improved with topical triamcinolone treatment.  He is on chronic pain medication including Lyrica, Cymbalta, as needed ibuprofen, ongoing treatment with pain management at [facility].  More recent lab work-up demonstrated positive ANA with SSB antibodies. ?  ?  ?Labs reviewed ?ANA pos ?SSB 2.0 ?SSAB, Scl-70, chromatin, RNP, dsDNA, SM, Jo-1, centromere Abs neg ?RF neg ?CCP neg ?CRP <4 ? ? ?Review of Systems  ?Constitutional:  Positive for fatigue.  ?HENT:  Negative for mouth dryness.   ?Eyes:  Negative for dryness.  ?Respiratory:  Positive for shortness of breath.   ?Cardiovascular:  Negative for swelling in legs/feet.  ?Gastrointestinal:  Negative for constipation.  ?Endocrine: Positive for excessive thirst and increased urination.  ?Genitourinary:  Negative for difficulty urinating.  ?Musculoskeletal:  Positive for joint pain, joint pain, joint swelling, muscle weakness, morning stiffness and muscle tenderness.  ?Skin:  Negative for rash.  ?Allergic/Immunologic: Positive for susceptible to infections.  ?Neurological:  Positive for numbness.  ?Hematological:  Negative for bruising/bleeding tendency.  ?Psychiatric/Behavioral:  Negative for sleep disturbance.   ? ?PMFS History:  ?Patient Active  Problem List  ? Diagnosis Date Noted  ? Onychomycosis 08/12/2021  ? Dry mouth and eyes 05/12/2021  ? ANA positive 08/28/2020  ? Foreign body sensation in throat 05/15/2020  ? Cigarette smoker 05/07/2020  ? Gastroesophageal reflux disease 05/07/2020  ? Nausea and vomiting 11/25/2016  ? Viral warts 11/25/2016  ? Pain of toe of left foot 08/09/2016  ? Acute allergic rhinitis 03/22/2016  ?  S/P cervical spinal fusion 03/22/2016  ? Cervical spondylosis with radiculopathy 11/24/2015  ? Cervical disc herniation 09/24/2015  ? Neuropathic pain 09/24/2015  ? Polyneuropathy associated with underlying disease (HCC) 08/13/2015  ? Pain in both lower extremities 08/06/2015  ? Nicotine dependence, uncomplicated 09/18/2013  ? Pain in joint involving lower leg 01/20/2012  ? Human immunodeficiency virus (HIV) disease (HCC) 10/25/2011  ?  ?Past Medical History:  ?Diagnosis Date  ? HIV disease (HCC)   ? Neuropathy   ? Patella-femoral syndrome   ? Sjogren's disease (HCC)   ?  ?Family History  ?Problem Relation Age of Onset  ? Breast cancer Mother   ? Healthy Father   ? ?Past Surgical History:  ?Procedure Laterality Date  ? BACK SURGERY    ? ?Social History  ? ?Social History Narrative  ? Not on file  ? ?Immunization History  ?Administered Date(s) Administered  ? Hep A / Hep B 09/23/2011  ? Hepatitis A, Adult 09/23/2011, 04/10/2012, 11/26/2013  ? Hepatitis A, Ped/Adol-2 Dose 09/23/2011, 04/10/2012, 11/26/2013  ? Hepatitis B, adult 09/23/2011  ? Hepatitis B, ped/adol 09/23/2011  ? Influenza Nasal 09/23/2011  ? Influenza Split 09/23/2011, 02/28/2012, 07/09/2013  ? Influenza, Seasonal, Injecte, Preservative Fre 02/28/2012, 07/09/2013, 02/24/2014, 06/06/2016  ? Influenza,Quad,Nasal, Live 09/23/2011  ? Influenza,inj,Quad PF,6+ Mos 02/24/2014, 06/06/2016  ? Influenza-Unspecified 02/03/2017  ? PFIZER(Purple Top)SARS-COV-2 Vaccination 01/14/2020, 02/04/2020  ? PPD Test 09/23/2011  ? Pneumococcal Conjugate-13 10/16/2012  ? Pneumococcal Polysaccharide-23 09/23/2011, 10/18/2017, 10/18/2017  ? Tdap 09/23/2011  ?  ? ?Objective: ?Vital Signs: BP 121/79 (BP Location: Left Arm, Patient Position: Sitting, Cuff Size: Normal)   Pulse 80   Resp 15   Ht 5' 8.5" (1.74 m)   Wt 153 lb (69.4 kg)   BMI 22.93 kg/m?   ? ?Physical Exam ?HENT:  ?   Right Ear: External ear normal.  ?   Left Ear: External ear normal.  ?   Mouth/Throat:  ?   Mouth:  Mucous membranes are moist.  ?   Pharynx: Oropharynx is clear.  ?Eyes:  ?   Conjunctiva/sclera: Conjunctivae normal.  ?Cardiovascular:  ?   Rate and Rhythm: Normal rate and regular rhythm.  ?Pulmonary:  ?   Effort: Pulmonary effort is normal.  ?   Breath sounds: Normal breath sounds.  ?Musculoskeletal:  ?   Right lower leg: No edema.  ?   Left lower leg: No edema.  ?Skin: ?   General: Skin is warm and dry.  ?   Findings: No rash.  ?   Comments: Small indented skin changes <20mm diameter on palmar creases of both hands ? ?Right 1st and 2nd toenail with dark discoloration, nail thickening on 1st toe, calluses present on plantar surfaces under 1st MTP, 5th MTP, and calcaneus  ?Neurological:  ?   Mental Status: He is alert.  ?Psychiatric:     ?   Mood and Affect: Mood normal.  ?  ? ?Musculoskeletal Exam:  ?Shoulders full ROM no tenderness or swelling ?Elbows full ROM no tenderness or swelling ?Wrists full ROM no tenderness or swelling ?Fingers full  ROM no tenderness or swelling ?Knees full ROM no tenderness or swelling ?Ankles full ROM no tenderness or swelling ?MTPs full ROM no tenderness or swelling ? ? ?Investigation: ?No additional findings. ? ?Imaging: ?No results found. ? ?Recent Labs: ?Lab Results  ?Component Value Date  ? WBC 14.4 (H) 03/21/2021  ? HGB 13.0 03/21/2021  ? PLT 427 (H) 03/21/2021  ? NA 139 03/21/2021  ? K 3.3 (L) 03/21/2021  ? CL 100 03/21/2021  ? CO2 28 03/21/2021  ? GLUCOSE 95 03/21/2021  ? BUN 12 03/21/2021  ? CREATININE 0.90 03/21/2021  ? BILITOT 0.5 03/21/2021  ? ALKPHOS 63 03/21/2021  ? AST 15 03/21/2021  ? ALT 10 03/21/2021  ? PROT 7.2 03/21/2021  ? ALBUMIN 3.2 (L) 03/21/2021  ? CALCIUM 9.1 03/21/2021  ? GFRAA >60 05/25/2017  ? ? ?Speciality Comments: No specialty comments available. ? ?Procedures:  ?No procedures performed ?Allergies: Amoxicillin and Prednisone  ? ?Assessment / Plan:     ?Visit Diagnoses: Sjogren syndrome, unspecified ([place]) ?Dry mouth and eyes ? ?Symptoms are pretty stable  he is continuing to use as needed lubricating drops for the eyes has not had any complications.  Denies any new skin rashes salivary gland swelling or lymphadenopathy. ? ?Onychomycosis ? ?Burning sensation in

## 2021-08-12 ENCOUNTER — Other Ambulatory Visit: Payer: Self-pay

## 2021-08-12 ENCOUNTER — Ambulatory Visit (INDEPENDENT_AMBULATORY_CARE_PROVIDER_SITE_OTHER): Payer: Medicaid Other | Admitting: Internal Medicine

## 2021-08-12 ENCOUNTER — Encounter: Payer: Self-pay | Admitting: Internal Medicine

## 2021-08-12 VITALS — BP 121/79 | HR 80 | Resp 15 | Ht 68.5 in | Wt 153.0 lb

## 2021-08-12 DIAGNOSIS — H04123 Dry eye syndrome of bilateral lacrimal glands: Secondary | ICD-10-CM

## 2021-08-12 DIAGNOSIS — M3500 Sicca syndrome, unspecified: Secondary | ICD-10-CM

## 2021-08-12 DIAGNOSIS — G63 Polyneuropathy in diseases classified elsewhere: Secondary | ICD-10-CM

## 2021-08-12 DIAGNOSIS — B351 Tinea unguium: Secondary | ICD-10-CM | POA: Diagnosis not present

## 2021-08-12 DIAGNOSIS — R682 Dry mouth, unspecified: Secondary | ICD-10-CM

## 2021-08-12 NOTE — Patient Instructions (Addendum)
I don't see any concerning findings for sjogren's syndrome causing any damage or needing new medications at this time. ? ?I agree with continuing your current treatments for dry eyes and mouth symptoms. ? ?I definitely recommend discussing foot and nail symptoms with your podiatrist. ?

## 2021-08-25 ENCOUNTER — Ambulatory Visit: Admit: 2021-08-25 | Payer: BLUE CROSS/BLUE SHIELD

## 2021-09-22 ENCOUNTER — Ambulatory Visit: Admit: 2021-09-22 | Discharge: 2021-09-22 | Payer: BLUE CROSS/BLUE SHIELD

## 2021-09-22 DIAGNOSIS — R053 Chronic cough: Principal | ICD-10-CM

## 2021-09-22 DIAGNOSIS — J309 Allergic rhinitis, unspecified: Principal | ICD-10-CM

## 2021-09-22 DIAGNOSIS — B20 Human immunodeficiency virus [HIV] disease: Principal | ICD-10-CM

## 2021-09-22 DIAGNOSIS — Z720 Tobacco use: Principal | ICD-10-CM

## 2021-09-22 DIAGNOSIS — Z113 Encounter for screening for infections with a predominantly sexual mode of transmission: Principal | ICD-10-CM

## 2021-09-22 MED ORDER — BECLOMETHASONE DIPROPIONATE (AQUEOUS) 42 MCG (0.042 %) NASAL SPRAY
Freq: Two times a day (BID) | NASAL | 0 refills | 0 days | Status: CP
Start: 2021-09-22 — End: ?

## 2021-09-22 MED ORDER — CETIRIZINE 10 MG TABLET
ORAL_TABLET | Freq: Every day | ORAL | 2 refills | 30 days | Status: CP
Start: 2021-09-22 — End: 2021-12-21

## 2021-09-24 DIAGNOSIS — Z8619 Personal history of other infectious and parasitic diseases: Principal | ICD-10-CM

## 2021-10-18 DIAGNOSIS — B20 Human immunodeficiency virus [HIV] disease: Principal | ICD-10-CM

## 2021-10-18 MED ORDER — GENVOYA 150 MG-150 MG-200 MG-10 MG TABLET
ORAL_TABLET | Freq: Every day | ORAL | 3 refills | 90.00000 days | Status: CP
Start: 2021-10-18 — End: 2021-10-18

## 2021-11-08 ENCOUNTER — Ambulatory Visit: Admit: 2021-11-08 | Payer: BLUE CROSS/BLUE SHIELD

## 2021-11-24 ENCOUNTER — Ambulatory Visit: Admit: 2021-11-24 | Payer: BLUE CROSS/BLUE SHIELD

## 2021-11-25 DIAGNOSIS — B20 Human immunodeficiency virus [HIV] disease: Principal | ICD-10-CM

## 2021-11-25 DIAGNOSIS — F17200 Nicotine dependence, unspecified, uncomplicated: Principal | ICD-10-CM

## 2021-11-25 DIAGNOSIS — Z113 Encounter for screening for infections with a predominantly sexual mode of transmission: Principal | ICD-10-CM

## 2021-11-25 DIAGNOSIS — Z720 Tobacco use: Principal | ICD-10-CM

## 2021-11-25 DIAGNOSIS — J309 Allergic rhinitis, unspecified: Principal | ICD-10-CM

## 2021-11-25 DIAGNOSIS — R053 Chronic cough: Principal | ICD-10-CM

## 2021-11-29 ENCOUNTER — Ambulatory Visit: Admit: 2021-11-29 | Payer: BLUE CROSS/BLUE SHIELD

## 2021-12-22 ENCOUNTER — Ambulatory Visit: Admit: 2021-12-22 | Payer: BLUE CROSS/BLUE SHIELD

## 2022-03-10 ENCOUNTER — Ambulatory Visit: Payer: Medicaid Other | Admitting: Student in an Organized Health Care Education/Training Program

## 2022-03-10 ENCOUNTER — Ambulatory Visit
Admission: RE | Admit: 2022-03-10 | Discharge: 2022-03-10 | Disposition: A | Discharge status: Home / Self Care | Payer: Medicaid Other | Source: Ambulatory Visit | Attending: Student in an Organized Health Care Education/Training Program | Admitting: Student in an Organized Health Care Education/Training Program

## 2022-03-10 ENCOUNTER — Other Ambulatory Visit: Payer: Self-pay | Admitting: Student in an Organized Health Care Education/Training Program

## 2022-03-10 ENCOUNTER — Encounter: Payer: Self-pay | Admitting: Student in an Organized Health Care Education/Training Program

## 2022-03-10 VITALS — BP 110/71 | HR 84 | Temp 98.1°F | Ht 68.0 in | Wt 138.0 lb

## 2022-03-10 DIAGNOSIS — G894 Chronic pain syndrome: Secondary | ICD-10-CM

## 2022-03-10 DIAGNOSIS — G629 Polyneuropathy, unspecified: Secondary | ICD-10-CM | POA: Diagnosis not present

## 2022-03-10 DIAGNOSIS — M792 Neuralgia and neuritis, unspecified: Secondary | ICD-10-CM | POA: Insufficient documentation

## 2022-03-10 DIAGNOSIS — R52 Pain, unspecified: Secondary | ICD-10-CM | POA: Insufficient documentation

## 2022-03-10 NOTE — Progress Notes (Signed)
Safety precautions to be maintained throughout the outpatient stay will include: orient to surroundings, keep bed in low position, maintain call bell within reach at all times, provide assistance with transfer out of bed and ambulation.  

## 2022-03-10 NOTE — Patient Instructions (Addendum)
Patient states he has completed psych eval- make sure we have results scanned in He has a thoracic MRI next week, make sure I have get those results Give patient soap solution to apply to lower back night before and morning of SCS trial ______________________________________________________________________  Preparing for Procedure with Sedation  NOTICE: Due to recent regulatory changes, starting on December 28, 2020, procedures requiring intravenous (IV) sedation will no longer be performed at the [facility].  These types of procedures are required to be performed at [facility] ambulatory surgery facility.  We are very sorry for the inconvenience.  Procedure appointments are limited to planned procedures: No Prescription Refills. No disability issues will be discussed. No medication changes will be discussed.  Instructions: Oral Intake: Do not eat or drink anything for at least 8 hours prior to your procedure. (Exception: Blood Pressure Medication. See below.) Transportation: A driver is required. You may not drive yourself after the procedure. Blood Pressure Medicine: Do not forget to take your blood pressure medicine with a sip of water the morning of the procedure. If your Diastolic (lower reading) is above 100 mmHg, elective cases will be cancelled/rescheduled. Blood thinners: These will need to be stopped for procedures. Notify our staff if you are taking any blood thinners. Depending on which one you take, there will be specific instructions on how and when to stop it. Diabetics on insulin: Notify the staff so that you can be scheduled 1st case in the morning. If your diabetes requires high dose insulin, take only  of your normal insulin dose the morning of the procedure and notify the staff that you have done so. Preventing infections: Shower with an antibacterial soap the morning of your procedure. Build-up your immune system: Take 1000 mg of Vitamin C with every meal (3 times a day)  the day prior to your procedure. Antibiotics: Inform the staff if you have a condition or reason that requires you to take antibiotics before dental procedures. Pregnancy: If you are pregnant, call and cancel the procedure. Sickness: If you have a cold, fever, or any active infections, call and cancel the procedure. Arrival: You must be in the facility at least 30 minutes prior to your scheduled procedure. Children: Do not bring children with you. Dress appropriately: There is always the possibility that your clothing may get soiled. Valuables: Do not bring any jewelry or valuables.  Reasons to call and reschedule or cancel your procedure: (Following these recommendations will minimize the risk of a serious complication.) Surgeries: Avoid having procedures within 2 weeks of any surgery. (Avoid for 2 weeks before or after any surgery). Flu Shots: Avoid having procedures within 2 weeks of a flu shots. (Avoid for 2 weeks before or after immunizations). Barium: Avoid having a procedure within 7-10 days after having had a radiological study involving the use of radiological contrast. (Myelograms, Barium swallow or enema study). Heart attacks: Avoid any elective procedures or surgeries for the initial 6 months after a "Myocardial Infarction" (Heart Attack). Blood thinners: It is imperative that you stop these medications before procedures. Let us know if you if you take any blood thinner.  Infection: Avoid procedures during or within two weeks of an infection (including chest colds or gastrointestinal problems). Symptoms associated with infections include: Localized redness, fever, chills, night sweats or profuse sweating, burning sensation when voiding, cough, congestion, stuffiness, runny nose, sore throat, diarrhea, nausea, vomiting, cold or Flu symptoms, recent or current infections. It is specially important if the infection is over the  area that we intend to treat. Heart and lung problems: Symptoms  that may suggest an active cardiopulmonary problem include: cough, chest pain, breathing difficulties or shortness of breath, dizziness, ankle swelling, uncontrolled high or unusually low blood pressure, and/or palpitations. If you are experiencing any of these symptoms, cancel your procedure and contact your primary care physician for an evaluation.  Remember:  Regular Business hours are:  Monday to Thursday 8:00 AM to 4:00 PM  Provider's Schedule: Milinda Pointer, MD:  Procedure days: Tuesday and Thursday 7:30 AM to 4:00 PM  Gillis Santa, MD:  Procedure days: Monday and Wednesday 7:30 AM to 4:00 PM ______________________________________________________________________
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
## 2022-04-18 ENCOUNTER — Ambulatory Visit
Payer: Medicaid Other | Attending: Student in an Organized Health Care Education/Training Program | Admitting: Student in an Organized Health Care Education/Training Program

## 2022-04-18 ENCOUNTER — Encounter: Payer: Self-pay | Admitting: Student in an Organized Health Care Education/Training Program

## 2022-04-18 ENCOUNTER — Ambulatory Visit
Admission: RE | Admit: 2022-04-18 | Discharge: 2022-04-18 | Disposition: A | Discharge status: Home / Self Care | Payer: Medicaid Other | Source: Ambulatory Visit | Attending: Student in an Organized Health Care Education/Training Program | Admitting: Student in an Organized Health Care Education/Training Program

## 2022-04-18 ENCOUNTER — Telehealth: Payer: Self-pay | Admitting: *Deleted

## 2022-04-18 DIAGNOSIS — M792 Neuralgia and neuritis, unspecified: Secondary | ICD-10-CM

## 2022-04-18 DIAGNOSIS — G629 Polyneuropathy, unspecified: Secondary | ICD-10-CM | POA: Insufficient documentation

## 2022-04-18 DIAGNOSIS — G894 Chronic pain syndrome: Secondary | ICD-10-CM | POA: Diagnosis not present

## 2022-04-18 MED ORDER — ROPIVACAINE HCL 2 MG/ML IJ SOLN
INTRAMUSCULAR | Status: AC
  Filled 2022-04-18: qty 20

## 2022-04-18 MED ORDER — LIDOCAINE HCL 2 % IJ SOLN
INTRAMUSCULAR | Status: AC
  Filled 2022-04-18: qty 20

## 2022-04-18 MED ORDER — LIDOCAINE HCL 2 % IJ SOLN
20.0000 mL | Freq: Once | INTRAMUSCULAR | Status: AC
  Administered 2022-04-18: 400 mg

## 2022-04-18 MED ORDER — FENTANYL CITRATE (PF) 100 MCG/2ML IJ SOLN
INTRAMUSCULAR | Status: AC
  Filled 2022-04-18: qty 2

## 2022-04-18 MED ORDER — LACTATED RINGERS IV SOLN: Freq: Once | INTRAVENOUS | Status: AC

## 2022-04-18 MED ORDER — MIDAZOLAM HCL 5 MG/5ML IJ SOLN
INTRAMUSCULAR | Status: AC
  Filled 2022-04-18: qty 5

## 2022-04-18 MED ORDER — CEPHALEXIN 500 MG PO CAPS: 500.0000 mg | ORAL_CAPSULE | Freq: Four times a day (QID) | ORAL | 0 refills | Status: AC

## 2022-04-18 MED ORDER — CEFAZOLIN SODIUM-DEXTROSE 2-4 GM/100ML-% IV SOLN
2.0000 g | Freq: Once | INTRAVENOUS | Status: AC
  Administered 2022-04-18: 2 g via INTRAVENOUS

## 2022-04-18 MED ORDER — ROPIVACAINE HCL 2 MG/ML IJ SOLN
9.0000 mL | Freq: Once | INTRAMUSCULAR | Status: AC
  Administered 2022-04-18: 9 mL via PERINEURAL

## 2022-04-18 MED ORDER — MIDAZOLAM HCL 2 MG/2ML IJ SOLN
0.5000 mg | Freq: Once | INTRAMUSCULAR | Status: AC
  Administered 2022-04-18: 1 mg via INTRAVENOUS

## 2022-04-18 MED ORDER — FENTANYL CITRATE (PF) 100 MCG/2ML IJ SOLN
25.0000 ug | INTRAMUSCULAR | Status: DC | PRN
  Administered 2022-04-18: 75 ug via INTRAVENOUS

## 2022-04-18 MED ORDER — CEFAZOLIN SODIUM 1 G IJ SOLR
INTRAMUSCULAR | Status: AC
  Filled 2022-04-18: qty 20

## 2022-04-18 NOTE — Progress Notes (Signed)
Upon discharge bleeding noted at the site and coming out from under drerssing.  Assisted paitient back to his tummy.  Removed soiled steri strips and tegaderm.  No active bleeding noted.  Secured SCS lines again.  Tegaderm applied over gauze and secured with elastiplast.  Ice to the affected area.  BL Aware.

## 2022-04-18 NOTE — Telephone Encounter (Signed)
Called patient back after SCS  trial.  He had experienced some active bleeding that we noticed as we were beginning to discharge.  He states that the bandages are secure and does not seem to be bleeding.  He will have his mom check on this when she returns home and he will give me a call back.  No other issues at this time.

## 2022-04-18 NOTE — Progress Notes (Signed)
PROVIDER NOTE: Interpretation of information contained herein should be left to medically-trained personnel. Specific patient instructions are provided elsewhere under "Patient Instructions" section of medical record. This document was created in part using STT-dictation technology, any transcriptional errors that may result from this process are unintentional.  Patient: Cody Villarreal Type: Established DOB: May 23, 1986 MRN: [id number] PCP: Cody Lions, PA-C  Service: Procedure DOS: 04/18/2022 Setting: Ambulatory Location: Ambulatory outpatient facility Delivery: Face-to-face Provider: Edward Jolly, MD Specialty: Interventional Pain Management Specialty designation: 09 Location: Outpatient facility Ref. Prov.: Cody Jolly, MD    Primary Reason for Admission: Surgical management of chronic pain condition.   Procedure:              Type: [employer] Trial Spinal Cord Neurostimulator Implant (Percutaneous, interlaminar, posterior epidural placement) Laterality: Bilateral (-50)  Level: Lumbar  Imaging: Fluoroscopic guidance Anesthesia: Local anesthesia (1-2% Lidocaine) Sedation: Moderate Sedation Moderate conscious sedation.         DOS: 04/18/2022  Performed by: Cody Jolly, MD  Purpose: Diagnostic. To determine if a permanent implant may be effective in controlling some or all of Cody Villarreal chronic pain symptoms.  Indications: Bilateral lower extremity neuropathic pain severe enough to impact quality of life or function. Rationale (medical necessity): procedure needed and proper for the diagnosis and/or treatment of Cody Villarreal medical symptoms and needs. 1. 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 Small fiber neuropathy   4. Chronic pain syndrome    NAS-11 Pain score:   Pre-procedure: 9 /10   Post-procedure: 0-No pain/10     Target: Posterior epidural space over the dorsal columns of the  spinal cord. Location: Posterior intraspinal canal Region: Thoracolumbar  Approach: Translaminar percutaneous  Type of procedure: Surgical   Position / Prep / Materials:  Position: Prone  Prep solution: DuraPrep (Iodine Povacrylex [0.7% available iodine] and Isopropyl Alcohol, 74% w/w) Prep Area: Entire  Posterior  Thoracolumbar  Region  Materials:  Tray: Implant tray Needle(s):  Type: Epidural  Gauge (G): 14g Length: Regular (10cm)  Qty: 2  Pre-op H&P Assessment:  Cody Villarreal is a 36 y.o. (year old), male patient, seen today for interventional treatment. He  has a past surgical history that includes Back surgery.  Initial Vital Signs:  Pulse/EKG Rate: 77ECG Heart Rate: 65 (NSR) Temp: 98.1 F (36.7 C) Resp: 16 BP: 129/76 SpO2: 100 %  BMI: Estimated body mass index is 20.38 kg/m as calculated from the following:   Height as of this encounter: 5\' 9"  (1.753 m).   Weight as of this encounter: 138 lb (62.6 kg).  Risk Assessment: Allergies: Reviewed. He is allergic to amoxicillin and prednisone.  Allergy Precautions: None required Coagulopathies: Reviewed. None identified.  Blood-thinner therapy: None at this time Active Infection(s): Reviewed. None identified. Cody Villarreal is afebrile  Site Confirmation: Cody Villarreal was asked to confirm the procedure and laterality before marking the site, which he did. Procedure checklist: Completed Consent: Before the procedure and under the influence of no sedative(s), amnesic(s), or anxiolytics, the patient was informed of the treatment options, risks and possible complications. To fulfill our ethical and legal obligations, as recommended by the American Medical Association's Code of Ethics, I have informed the patient of my clinical impression; the nature and purpose of the treatment or procedure; the risks, benefits, and possible complications of the intervention; the alternatives, including doing nothing; the risk(s) and benefit(s) of  the alternative treatment(s) or procedure(s); and the  risk(s) and benefit(s) of doing nothing.  Cody Villarreal was provided with information about the general risks and possible complications associated with most interventional procedures. These include, but are not limited to: failure to achieve desired goals, infection, bleeding, organ or nerve damage, allergic reactions, paralysis, and/or death.  In addition, he was informed of those risks and possible complications associated to this particular procedure, which include, but are not limited to: damage to the implant; failure to decrease pain; local, systemic, or serious CNS infections, intraspinal abscess with possible cord compression and paralysis, or life-threatening such as meningitis; intrathecal and/or epidural bleeding with formation of hematoma with possible spinal cord compression and permanent paralysis; organ damage; nerve injury or damage with subsequent sensory, motor, and/or autonomic system dysfunction, resulting in transient or permanent pain, numbness, and/or weakness of one or several areas of the body; allergic reactions, either minor or major life-threatening, such as anaphylactic or anaphylactoid reactions.  Furthermore, Cody Villarreal was informed of those risks and complications associated with the medications. These include, but are not limited to: allergic reactions (i.e.: anaphylactic or anaphylactoid reactions); arrhythmia;  Hypotension/hypertension; cardiovascular collapse; respiratory depression and/or shortness of breath; swelling or edema; medication-induced neural toxicity; particulate matter embolism and blood vessel occlusion with resultant organ, and/or nervous system infarction and permanent paralysis.  Finally, he was informed that Medicine is not an exact science; therefore, there is also the possibility of unforeseen or unpredictable risks and/or possible complications that may result in a catastrophic outcome. The  patient indicated having understood very clearly. We have given the patient no guarantees and we have made no promises. Enough time was given to the patient to ask questions, all of which were answered to the patient's satisfaction. Mr. Fera has indicated that he wanted to continue with the procedure. Attestation: I, the ordering provider, attest that I have discussed with the patient the benefits, risks, side-effects, alternatives, likelihood of achieving goals, and potential problems during recovery for the procedure that I have provided informed consent. Date  Time: 04/18/2022  8:11 AM  Pre-Procedure Preparation:  Monitoring: As per clinic protocol. Respiration, ETCO2, SpO2, BP, heart rate and rhythm monitor placed and checked for adequate function Safety Precautions: Patient was assessed for positional comfort and pressure points before starting the procedure. Time-out: I initiated and conducted the "Time-out" before starting the procedure, as per protocol. The patient was asked to participate by confirming the accuracy of the "Time Out" information. Verification of the correct person, site, and procedure were performed and confirmed by me, the nursing staff, and the patient. "Time-out" conducted as per Joint Commission's Universal Protocol (UP.01.01.01). Time: 0855  Description/Narrative of Procedure:          Rationale (medical necessity): procedure needed and proper for the diagnosis and/or treatment of the patient's medical symptoms and needs. Procedural Technique Safety Precautions: Aspiration looking for blood return was conducted prior to all injections. At no point did we inject any substances, as a needle was being advanced. No attempts were made at seeking any paresthesias. Safe injection practices and needle disposal techniques used. Medications properly checked for expiration dates. SDV (single dose vial) medications used. Description of the Procedure: Protocol guidelines were  followed. The patient was assisted into a comfortable position. The target area was identified and the area prepped in the usual manner. Skin & deeper tissues infiltrated with local anesthetic. Appropriate amount of time allowed to pass for local anesthetics to take effect. The procedure needles were then advanced to the target area. Proper  needle placement secured. Negative aspiration confirmed. Solution injected in intermittent fashion, asking for systemic symptoms every 0.5cc of injectate. The needles were then removed and the area cleansed, making sure to leave some of the prepping solution back to take advantage of its long term bactericidal properties.  Technical description of procedure: Availability of a responsible, adult driver, and NPO status confirmed. Informed consent was obtained after having discussed risks and possible complications. An IV was started. The patient was then taken to the fluoroscopy suite, where the patient was placed in position for the procedure, over the fluoroscopy table. The patient was then monitored in the usual manner. Fluoroscopy was manipulated to obtain the best possible view of the target. Parallex error was corrected before commencing the procedure. Once a clear view of the target had been obtained, the skin and deeper tissues over the procedure site were infiltrated using lidocaine, loaded in a 10 cc luer-loc syringe with a 0.5 inch, 25-G needle. The introducer needle(s) was/were then inserted through the skin and deeper tissues. A paramidline approach was used to enter the posterior epidural space at a 30 angle, using "Loss-of-resistance Technique" with 3 ml of PF-NaCl (0.9% NSS). Correct needle placement was confirmed in the antero-posterior and lateral fluoroscopic views. The lead was gently introduced and manipulated under real-time fluoroscopy, constantly assessing for pain, discomfort, or paresthesias, until the tip rested at the desired level. Both sides were  done in identical fashion. Electrode placement was tested until appropriate coverage was attained. Once the patient confirmed that the stimulation was over the desired area, the lead(s) was/were secured in place and the introducer needles removed. This was done under real-time fluoroscopy while observing the electrode tip to avoid unintended migration. The area was covered with a non-occlusive dressing and the patient transported to recovery for further programming.  Vitals:   04/18/22 1006 04/18/22 1016 04/18/22 1020 04/18/22 1027  BP: 110/71 107/74 114/82 113/83  Pulse:      Resp: Temp:      SpO2: 100% 100% 99% 99%  Weight:      Height:        Start Time: 0855 hrs. End Time: 0955 hrs.  Neurostimulator Details:   Lead(s):  Brand: Medtronic         Epidural Access Level:  L1-2 T12-L1  Lead implant:  Bilateral   No. of Electrodes/Lead:  16 16  Laterality:  Right Left  Top electrode location:  T10 T9       Imaging Guidance (Spinal):          Type of Imaging Technique: Fluoroscopy Guidance (Spinal) Indication(s): Assistance in needle guidance and placement for procedures requiring needle placement in or near specific anatomical locations not easily accessible without such assistance. Exposure Time: Please see nurses notes. Contrast: None used. Fluoroscopic Guidance: I was personally present during the use of fluoroscopy. "Tunnel Vision Technique" used to obtain the best possible view of the target area. Parallax error corrected before commencing the procedure. "Direction-depth-direction" technique used to introduce the needle under continuous pulsed fluoroscopy. Once target was reached, antero-posterior, oblique, and lateral fluoroscopic projection used confirm needle placement in all planes. Images permanently stored in EMR. Interpretation: No contrast injected. I personally interpreted the imaging intraoperatively. Adequate needle placement confirmed in multiple  planes. Permanent images saved into the patient's record.  Antibiotic Prophylaxis:   Anti-infectives (From admission, onward)    Start     Dose/Rate Route Frequency Ordered Stop   04/18/22 0845  ceFAZolin (  ANCEF) IVPB 2g/100 mL premix        2 g 200 mL/hr over 30 Minutes Intravenous  Once 04/18/22 0830 04/18/22 0901   04/18/22 0000  cephALEXin (KEFLEX) 500 MG capsule        500 mg Oral 4 times daily 04/18/22 [id number] 04/25/22 2359      Indication(s): Implant Prophylaxis.  Post-operative Assessment:  Post-procedure Vital Signs:  Pulse/HCG Rate: 7762 Temp: 98.1 F (36.7 C) Resp: 14 BP: 113/83 SpO2: 99 %  Complications: No immediate post-treatment complications observed by team, or reported by patient.  Note:  In the recovery room, there was noted to be blood under the Tegaderm.  Tegaderm was removed and blood was cleaned off.  Pressure was held which slowed the bleeding.  This area was redressed and read Tegaderm.  This was inspected again and there was no bleeding present.  Patient was endorsed to keep an eye on insertion site and let us know if he noticed any additional bleeding.  He was endorsing soreness in his back which is common postprocedural.  We did provide him with an ice pack to use on his lower back.  He can also take Tylenol 650 mg 4 times a day as needed.  I recommend that he avoid NSAIDs.    A repeat set of vitals were taken after the procedure and the patient was kept under observation following institutional policy, for this type of procedure. Post-procedural neurological assessment was performed, showing return to baseline, prior to discharge. The patient was provided with post-procedure discharge instructions, including a section on how to identify potential problems. Should any problems arise concerning this procedure, the patient was given instructions to immediately contact us, at any time, without hesitation. In any case, we plan to contact the patient by telephone for a  follow-up status report regarding this interventional procedure.  5 out of 5 strength bilateral lower extremity: Plantar flexion, dorsiflexion, knee flexion, knee extension.   Comments:  No additional relevant information.  Plan of Care  Orders:  Orders Placed This Encounter  Procedures   DG PAIN CLINIC C-ARM 1-60 MIN NO REPORT    Intraoperative interpretation by procedural physician at [facility].    Standing Status:   Standing    Number of Occurrences:   1    Order Specific Question:   Reason for exam:    Answer:   Assistance in needle guidance and placement for procedures requiring needle placement in or near specific anatomical locations not easily accessible without such assistance.    Medications administered: We administered lidocaine, lactated ringers, midazolam, fentaNYL, ceFAZolin, and ropivacaine (PF) 2 mg/mL (0.2%).  See the medical record for exact dosing, route, and time of administration.  AVS: Today we did the following -We have done a Spinal Cord Stimulator Trial with Medtronic  -As long as the leads are in place, do not bathe or shower. You may sponge bathe.  -While the lead is in place, please limit the bending, lifting, or twisting because the lead can move.  -The things we want to see is if your pain improves (and by what percentage), if you can do more activity (don't overdo it), and if you can use less of your "as needed" medicine. Do not stop long acting medicines like methadone, oxycontin, MS Contin, etc without checking with us.  -It is VERY important that you pick up the antibiotics we prescribed, Keflex, on your way home from the trial and take them as prescribed(4 times a day),  starting today, for as long as the lead is in place.  -The Spina Cord Stimulator Representative will be in contact with you while the lead is in place to make sure the trial goes as well as possible.  -Please contact us with any questions or concerns at any time  during the trial.   -If you start running a fever over 100 degrees, have severe back pain, or new pain running down the legs, or drainage coming from the lead site, contact us immediately and/or go to the emergency room.  -Please do not restart any sort of medication that can thin your blood such as Aspirin, ibuprofen, motrin, aleve, plavix, coumadin, etc. If you aren't sure, call and ask.  -We will have you return next Monday to have the lead removed. If this is successful, at that point we can go over the details about the permanent implant.    Follow-up plan:   Return in about 1 week (around 04/25/2022) for SCS lead pull.     Recent Visits Date Type Provider Dept  03/10/22 Office Visit Cody Jolly, MD [facility]  Showing recent visits within past 90 days and meeting all other requirements Today's Visits Date Type Provider Dept  04/18/22 Procedure visit Cody Jolly, MD [facility]  Showing today's visits and meeting all other requirements Future Appointments Date Type Provider Dept  04/25/22 Appointment Cody Jolly, MD [facility]  Showing future appointments within next 90 days and meeting all other requirements  Disposition: Discharge home  Discharge (Date  Time): 04/18/2022; 1105 hrs.   Primary Care Physician: Rosemary Holms Location: [facility] Note by: Cody Jolly, MD Date: 04/18/2022; Time: 4:11 PM

## 2022-04-18 NOTE — Telephone Encounter (Signed)
Spoke back with patient, denies any further bleeding at the site.  States he is very sore in his back.  Encouraged to continue using ice and he may use tylenol  for discomfort.  Asked if it is contraindicated to lie on his back and explained it is not contraindicated, just bending, stooping or things of that nature.  No ibuprofen.  Patient verbalizes u/o of this information.  All information was reported to BL.

## 2022-04-18 NOTE — Patient Instructions (Addendum)
Today we did the following -We have done a Spinal Cord Stimulator Trial with [employer]  -As long as the leads are in place, do not bathe or shower. You may sponge bathe.  -While the lead is in place, please limit the bending, lifting, or twisting because the lead can move.  -The things we want to see is if your pain improves (and by what percentage), if you can do more activity (don't overdo it), and if you can use less of your "as needed" medicine. Do not stop long acting medicines like methadone, oxycontin, MS Contin, etc without checking with [place].  -It is VERY important that you pick up the antibiotics we prescribed, Keflex, on your way home from the trial and take them as prescribed(4 times a day), starting today, for as long as the lead is in place.  -The Spina Cord Stimulator Representative will be in contact with you while the lead is in place to make sure the trial goes as well as possible.  -Please contact us with any questions or concerns at any time during the trial.   -If you start running a fever over 100 degrees, have severe back pain, or new pain running down the legs, or drainage coming from the lead site, contact us immediately and/or go to the emergency room.  -Please do not restart any sort of medication that can thin your blood such as Aspirin, ibuprofen, motrin, aleve, plavix, coumadin, etc. If you aren't sure, call and ask.  -We will have you return next Monday to have the lead removed. If this is successful, at that point we can go over the details about the permanent implant.

## 2022-04-18 NOTE — Progress Notes (Signed)
Safety precautions to be maintained throughout the outpatient stay will include: orient to surroundings, keep bed in low position, maintain call bell within reach at all times, provide assistance with transfer out of bed and ambulation.  

## 2022-04-19 ENCOUNTER — Telehealth: Payer: Self-pay | Admitting: *Deleted

## 2022-04-19 NOTE — Telephone Encounter (Signed)
No problems post procedure. 

## 2022-04-20 ENCOUNTER — Telehealth: Payer: Self-pay | Admitting: *Deleted

## 2022-04-20 NOTE — Telephone Encounter (Signed)
Attempted to call for post SCS trial. Message left.

## 2022-04-25 ENCOUNTER — Other Ambulatory Visit: Payer: Self-pay | Admitting: Student in an Organized Health Care Education/Training Program

## 2022-04-25 ENCOUNTER — Ambulatory Visit (HOSPITAL_BASED_OUTPATIENT_CLINIC_OR_DEPARTMENT_OTHER): Payer: Medicaid Other | Admitting: Student in an Organized Health Care Education/Training Program

## 2022-04-25 ENCOUNTER — Encounter: Payer: Self-pay | Admitting: Student in an Organized Health Care Education/Training Program

## 2022-04-25 ENCOUNTER — Ambulatory Visit
Admission: RE | Admit: 2022-04-25 | Discharge: 2022-04-25 | Disposition: A | Discharge status: Home / Self Care | Payer: Medicaid Other | Source: Ambulatory Visit | Attending: Student in an Organized Health Care Education/Training Program | Admitting: Student in an Organized Health Care Education/Training Program

## 2022-04-25 VITALS — BP 115/78 | HR 85 | Temp 98.2°F | Ht 69.0 in | Wt 140.0 lb

## 2022-04-25 DIAGNOSIS — G894 Chronic pain syndrome: Secondary | ICD-10-CM

## 2022-04-25 DIAGNOSIS — M792 Neuralgia and neuritis, unspecified: Secondary | ICD-10-CM | POA: Diagnosis present

## 2022-04-25 DIAGNOSIS — G629 Polyneuropathy, unspecified: Secondary | ICD-10-CM

## 2022-04-25 NOTE — Patient Instructions (Addendum)
[employer] representative here for lead pull and education re; referral to Dr Myer Haff.

## 2022-04-25 NOTE — Progress Notes (Signed)
Safety precautions to be maintained throughout the outpatient stay will include: orient to surroundings, keep bed in low position, maintain call bell within reach at all times, provide assistance with transfer out of bed and ambulation. Safety precautions to be maintained throughout the outpatient stay will include: orient to surroundings, keep bed in low position, maintain call bell within reach at all times, provide assistance with transfer out of bed and ambulation.  

## 2022-04-25 NOTE — Progress Notes (Signed)
PROVIDER NOTE: Information contained herein reflects review and annotations entered in association with encounter. Interpretation of such information and data should be left to medically-trained personnel. Information provided to patient can be located elsewhere in the medical record under "Patient Instructions". Document created using STT-dictation technology, any transcriptional errors that may result from process are unintentional.    Patient: Cody Villarreal  Service Category: E/M  Provider: Gillis Santa, MD  DOB: 10-20-1985  DOS: 04/25/2022  Referring Provider: Annye English  MRN: [id number]  Specialty: Interventional Pain Management  PCP: Loyola Mast, PA-C  Type: Established Patient  Setting: Ambulatory outpatient    Location: Office  Delivery: Face-to-face     HPI  [name], a 36 y.o. 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 Mr. Thain primary complain today is Leg Pain (bialteral) Last encounter: My last encounter with him was on 04/18/2022. Pain Assessment: Severity of Chronic pain is reported as a 2 /10. Location: Leg Right, Left/radiates to feet bilaterally. Onset: More than a month ago. Quality: Aching, Burning, Constant. Timing: Constant. Modifying factor(s): repositioning. Vitals:  height is _0  (1.753 m) and weight is 140 lb (63.5 kg). His temporal temperature is 98.2 F (36.8 C). His blood pressure is 115/78 and his pulse is 85. His oxygen saturation is 100%.   Reason for encounter:  [employer] spinal cord stimulator trial lead pull .   Status post successful spinal cord stimulator trial with [employer].  Endorses approximately 80% pain relief during his trial in regards to his lower extremity neuropathic pain.  He states that it was easier to perform ADLs and that the severity of his paresthesias were also reduced during spinal cord stim trial.  He is interested in moving forward with  permanent implant.  Referral placed to Dr. Cari Caraway.  Medication Review  DULoxetine, buprenorphine, cephALEXin, doxycycline, elvitegravir-cobicistat-emtricitabine-tenofovir, gabapentin, ibuprofen, ipratropium, lamoTRIgine, levofloxacin, metaxalone, montelukast, ondansetron, pregabalin, and tapentadol  History Review  Allergy: Mr. Reffner is allergic to amoxicillin and prednisone. Drug: Mr. Drinkard  reports no history of drug use. Alcohol:  reports no history of alcohol use. Tobacco:  reports that he has been smoking cigarettes. He has a 9.00 pack-year smoking history. He has never used smokeless tobacco. Social: Mr. Hamon  reports that he has been smoking cigarettes. He has a 9.00 pack-year smoking history. He has never used smokeless tobacco. He reports that he does not drink alcohol and does not use drugs. Medical:  has a past medical history of HIV disease ([place]), Neuropathy, Patella-femoral syndrome, and Sjogren's disease ([place]). Surgical: Mr. Siddiqi  has a past surgical history that includes Back surgery. Family: family history includes Breast cancer in his mother; Healthy in his father.  Laboratory Chemistry Profile   Renal Lab Results  Component Value Date   BUN 12 03/21/2021   CREATININE 0.90 03/21/2021   GFRAA >60 05/25/2017   GFRNONAA >60 03/21/2021    Hepatic Lab Results  Component Value Date   AST 15 03/21/2021   ALT 10 03/21/2021   ALBUMIN 3.2 (L) 03/21/2021   ALKPHOS 63 03/21/2021    Electrolytes Lab Results  Component Value Date   NA 139 03/21/2021   K 3.3 (L) 03/21/2021   CL 100 03/21/2021   CALCIUM 9.1 03/21/2021    Bone No results found for: "VD25OH", "VD125OH2TOT", "IF0277AJ2", "IN8676HM0", "25OHVITD1", "25OHVITD2", "25OHVITD3", "TESTOFREE", "TESTOSTERONE"  Inflammation (CRP: Acute Phase) (ESR: Chronic Phase) Lab Results  Component Value Date  ESRSEDRATE 19 (H) 05/12/2021   LATICACIDVEN 1.14 05/04/2017         Note: Above Lab results  reviewed.  Recent Imaging Review  DG PAIN CLINIC C-ARM 1-60 MIN NO REPORT Fluoro was used, but no Radiologist interpretation will be provided.  Please refer to "NOTES" tab for provider progress note. Note: Reviewed        Physical Exam  General appearance: Well nourished, well developed, and well hydrated. In no apparent acute distress Mental status: Alert, oriented x 3 (person, place, & time)       Respiratory: No evidence of acute respiratory distress Eyes: PERLA Vitals: BP 115/78   Pulse 85   Temp 98.2 F (36.8 C) (Temporal)   Ht _0  (1.753 m)   Wt 140 lb (63.5 kg)   SpO2 100%   BMI 20.67 kg/m  BMI: Estimated body mass index is 20.67 kg/m as calculated from the following:   Height as of this encounter: _1  (1.753 m).   Weight as of this encounter: 140 lb (63.5 kg). Ideal: Ideal body weight: 70.7 kg (155 lb 13.8 oz)  Spinal cord stimulator trial leads removed with tip intact Insertion site clean, dry, not erythematous, nontender  5 out of 5 strength bilateral lower extremity: Plantar flexion, dorsiflexion, knee flexion, knee extension.   Assessment   Diagnosis Status  1. 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 Small fiber neuropathy   4. Chronic pain syndrome    Responding Responding Persistent     Plan of Care  Status post successful Medtronic spinal cord stimulator trial.  Referral to Dr. Cari Caraway with neurosurgery for permanent Medtronic spinal cord stimulator implant.   Orders:  Orders Placed This Encounter  Procedures   Ambulatory referral to Neurosurgery    Referral Priority:   Routine    Referral Type:   Surgical    Referral Reason:   Specialty Services Required    Referred to Provider:   Meade Maw, MD    Requested Specialty:   Neurosurgery    Number of Visits Requested:   1   Follow-up plan:   No follow-ups on file.    Recent Visits Date Type Provider  Dept  04/18/22 Procedure visit Gillis Santa, MD [facility]  03/10/22 Office Visit Gillis Santa, MD [facility]  Showing recent visits within past 90 days and meeting all other requirements Today's Visits Date Type Provider Dept  04/25/22 Procedure visit Gillis Santa, MD [facility]  Showing today's visits and meeting all other requirements Future Appointments No visits were found meeting these conditions. Showing future appointments within next 90 days and meeting all other requirements  I discussed the assessment and treatment plan with the patient. The patient was provided an opportunity to ask questions and all were answered. The patient agreed with the plan and demonstrated an understanding of the instructions.  Patient advised to call back or seek an in-person evaluation if the symptoms or condition worsens.  Duration of encounter: 65mnutes.  Total time on encounter, as per AMA guidelines included both the face-to-face and non-face-to-face time personally spent by the physician and/or other qualified health care professional(s) on the day of the encounter (includes time in activities that require the physician or other qualified health care professional and does not include time in activities normally performed by clinical staff). Physician's time may include the following activities when performed: preparing to see the patient (  eg, review of tests, pre-charting review of records) obtaining and/or reviewing separately obtained history performing a medically appropriate examination and/or evaluation counseling and educating the patient/family/caregiver ordering medications, tests, or procedures referring and communicating with other health care professionals (when not separately reported) documenting clinical information in the electronic or other health record independently interpreting results (not separately reported) and communicating results to the  patient/ family/caregiver care coordination (not separately reported)  Note by: Gillis Santa, MD Date: 04/25/2022; Time: 11:45 AM

## 2022-04-28 ENCOUNTER — Ambulatory Visit: Payer: Medicaid Other | Admitting: Neurosurgery

## 2022-04-29 ENCOUNTER — Ambulatory Visit
Admission: RE | Admit: 2022-04-29 | Discharge: 2022-04-29 | Disposition: A | Discharge status: Home / Self Care | Payer: Self-pay | Source: Ambulatory Visit | Attending: Neurosurgery | Admitting: Neurosurgery

## 2022-04-29 ENCOUNTER — Other Ambulatory Visit: Payer: Self-pay

## 2022-04-29 DIAGNOSIS — Z049 Encounter for examination and observation for unspecified reason: Secondary | ICD-10-CM

## 2022-05-09 NOTE — Progress Notes (Unsigned)
Referring Physician:  Edward Jolly, MD [address] [place],  [place] [address]  Primary Physician:  Bryon Lions, PA-C  History of Present Illness: 05/10/2022 [name] is here today with a chief complaint of bilateral leg pain from behind his knees to the bottom of his feet.  He has had chronic pain for several years.  He had spinal surgery in 2015 and 2017 with only short-term relief.  He is here to discuss a permanent spinal cord stimulator. He had the trial on 04/18/22 to 04/25/22 with [employer]. He reports 70-80% relief during the trial.  Psych clearance was completed on 01/27/22. He has also had a Thoracic MRI.    Past Surgery: spinal fusion in 2015 and 2017 by Dr Wynelle Link  Cody Villarreal has no symptoms of cervical myelopathy.  The symptoms are causing a significant impact on the patient's life.   I have utilized the care everywhere function in epic to review the outside records available from external health systems.  Review of Systems:  A 10 point review of systems is negative, except for the pertinent positives and negatives detailed in the HPI.  Past Medical History: Past Medical History:  Diagnosis Date   HIV disease (HCC)    Neuropathy    Patella-femoral syndrome    Sjogren's disease (HCC)     Past Surgical History: Past Surgical History:  Procedure Laterality Date   BACK SURGERY      Allergies: Allergies as of 05/10/2022 - Review Complete 05/10/2022  Allergen Reaction Noted   Amoxicillin Hives 06/28/2018   Prednisone Nausea And Vomiting 04/30/2021    Medications: Current Meds  Medication Sig   DULoxetine (CYMBALTA) 30 MG capsule Take 60 mg by mouth 3 (three) times daily.   GENVOYA 150-150-200-10 MG TABS tablet Take 1 tablet by mouth daily.   montelukast (SINGULAIR) 10 MG tablet    pregabalin (LYRICA) 150 MG capsule Take 150 mg by mouth 3 (three) times daily.    Social History: Social History   Tobacco Use   Smoking  status: Every Day    Packs/day: 0.50    Years: 18.00    Total pack years: 9.00    Types: Cigarettes   Smokeless tobacco: Never  Vaping Use   Vaping Use: Never used  Substance Use Topics   Alcohol use: No   Drug use: No    Family Medical History: Family History  Problem Relation Age of Onset   Breast cancer Mother    Healthy Father     Physical Examination: Vitals:   05/10/22 0915  BP: 110/70  Pulse: 70    General: Patient is well developed, well nourished, calm, collected, and in no apparent distress. Attention to examination is appropriate.  Neck:   Supple.  Full range of motion.  Respiratory: Patient is breathing without any difficulty.   NEUROLOGICAL:     Awake, alert, oriented to person, place, and time.  Speech is clear and fluent. Fund of knowledge is appropriate.   Cranial Nerves: Pupils equal round and reactive to light.  Facial tone is symmetric.  Facial sensation is symmetric. Shoulder shrug is symmetric. Tongue protrusion is midline.  There is no pronator drift.  ROM of spine: full.    Strength: Side Biceps Triceps Deltoid Interossei Grip Wrist Ext. Wrist Flex.  R 5 5 5 5 5 5 5   L 5 5 5 5 5 5 5    Side Iliopsoas Quads Hamstring PF DF EHL  R 5 5 5  5 5 5   L 5 5 5 5 5 5    Reflexes are 1+ and symmetric at the biceps, triceps, brachioradialis, patella and achilles.   Hoffman's is absent.   Bilateral upper and lower extremity sensation is intact to light touch.    No evidence of dysmetria noted.  Gait is normal.     Medical Decision Making  Imaging: MRI thoracic spine reviewed from March 14, 2022.  There are no significant findings of stenosis at any level.  I have personally reviewed the images and agree with the above interpretation.  Assessment and Plan: [name] is a pleasant 36 y.o. male with chronic pain syndrome.  He has undergone a successful spinal cord stimulator evaluation including a psychologic evaluation and this felt to be a  good candidate for placement of a permanent device.  I think this is reasonable and will proceed with placing 1.  We reviewed the risks and benefits.  He underwent a Medtronic evaluation, so we will place a Medtronic device.  He is getting new insurance on January 1, so we will wait until after January 1 to submit authorizations.  I discussed the planned procedure at length with the patient, including the risks, benefits, alternatives, and indications. The risks discussed include but are not limited to bleeding, infection, need for reoperation, spinal fluid leak, stroke, vision loss, anesthetic complication, coma, paralysis, and even death. I also described in detail that improvement was not guaranteed.  The patient expressed understanding of these risks, and asked that we proceed with surgery. I described the surgery in layman's terms, and gave ample opportunity for questions, which were answered to the best of my ability.  I spent a total of 30 minutes in this patient's care today. This time was spent reviewing pertinent records including imaging studies, obtaining and confirming history, performing a directed evaluation, formulating and discussing my recommendations, and documenting the visit within the medical record.      Thank you for involving me in the care of this patient.      Yoland Scherr K. 01-15-1980 MD, [facility] Neurosurgery

## 2022-05-10 ENCOUNTER — Encounter: Payer: Self-pay | Admitting: Neurosurgery

## 2022-05-10 ENCOUNTER — Ambulatory Visit: Payer: Medicaid Other | Admitting: Neurosurgery

## 2022-05-10 VITALS — BP 110/70 | HR 70 | Ht 69.0 in | Wt 150.8 lb

## 2022-05-10 DIAGNOSIS — G894 Chronic pain syndrome: Secondary | ICD-10-CM | POA: Diagnosis not present

## 2022-05-10 NOTE — Patient Instructions (Addendum)
Please see below for information in regards to your upcoming surgery:   *send a mychart message with a copy of front and back of your new insurance card once you receive it   Planned surgery: spinal cord stimulator placement ([employer])   Surgery date: 06/22/22 - you will find out your arrival time the business day before your surgery.   Pre-op appointment at [facility] Pre-admit Testing: we will call you with a date/time for this. Pre-admit testing is located on the first floor of the [facility], [address] [facility] [address], [address]. Please bring all prescriptions in the original prescription bottles to your appointment, even if you have reviewed medications by phone with a pharmacy representative. Pre-op labs may be done at your pre-op appointment. You are not required to fast for these labs. Should you need to change your pre-op appointment, please call Pre-admit testing at [phone].    If you have FMLA/disability paperwork, please drop it off or fax it to [phone], attention [place].   We can be reached by phone or mychart 8am-4pm, Monday-Friday. If you have any questions/concerns before or after surgery, you can reach [place] at [phone], or you can send a mychart message. If you have a concern after hours that cannot wait until normal business hours, you can call [phone] and ask to page the neurosurgeon on call for [place].    Appointments/FMLA & disability paperwork: [place] Nurse: Royston Cowper  Medical assistant: Irving Burton Physician Assistant's: Manning Charity & Drake Leach Surgeon: Venetia Night, MD

## 2022-05-12 ENCOUNTER — Other Ambulatory Visit: Payer: Self-pay

## 2022-05-12 DIAGNOSIS — Z01818 Encounter for other preprocedural examination: Secondary | ICD-10-CM

## 2022-05-13 ENCOUNTER — Encounter: Payer: Self-pay | Admitting: Neurosurgery

## 2022-05-13 NOTE — Telephone Encounter (Signed)
Patient will have his HR department send forms over to the office.

## 2022-05-13 NOTE — Telephone Encounter (Signed)
Patient calling, he would like to ask if Dr.Yarbrough would be willing to take him out of work starting 05/30/2022. His surgery is on 06/22/2022. He is leaving early every day from work because of the pain he is in. He does not want to lose his job. He is trying to turn in his disability paperwork today. Would you sign for him to start disability on 05/30/2022?

## 2022-05-18 ENCOUNTER — Telehealth: Payer: Self-pay

## 2022-05-18 NOTE — Telephone Encounter (Signed)
Right now [facility] has denied his surgery and we are working on appealing it.  I also tried to get an approval with his [employer] but it wouldn't let me get an approval so far in advance (it said you can't get an approval more than 30 days in advance). We do not have anything left for December.  We would have to wait until we are able to get it overturned and then we can possibly move it to a sooner date in January.

## 2022-05-18 NOTE — Telephone Encounter (Signed)
-----   Message from Rockey Situ sent at 05/18/2022 10:41 AM EST ----- Regarding: move his sx up Contact: [phone] SCS placement 06/22/22 Burning sensation in his legs and feet has increased in past 2 days. He feels the colder weather has intensified the symptoms. Can his surgery be moved up?

## 2022-05-18 NOTE — Telephone Encounter (Addendum)
He did not have [employer] when he had his SCS trial, but is switching to [facility] in January. I submitted the auth already and [facility] has denied his SCS with diagnosis of G89.4 (chronic pain). They stated they only cover SCS for 3 things:  1) failed lumbar spine surgery 2) CRPS 3) diabetic neuropathy   # for peer to peer is [phone] Reference # [id number] Must be completed within 21 calendar days from 05/18/22.

## 2022-05-18 NOTE — Telephone Encounter (Signed)
He was notified of your answer and he will just wait to hear back from the office once surgery has been approved. If there is an opening sooner great if not you can leave it as is.

## 2022-05-20 NOTE — Telephone Encounter (Signed)
Spoke with Dr. Dorene Grebe for peer to peer. We do not have any documentation to support him having previous lumbar surgery (only cervical fusion which does not count).   If we can find lumbar surgery documentation then can file appeal with these additional documents.   Does not qualify for diagnosis of neuropathic pain unless it is diabetic neuropathy or CRPS.   She recommends filing appeal even if we don't have proof he had back surgery. Since he had such relief with trial, they may be able to approve it at the appeal level.

## 2022-05-20 NOTE — Telephone Encounter (Signed)
I called [phone].   Reference number is [id number]  Need to resubmit with failed back syndrome/failed lumbar spine surgery as his diagnosis. 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

## 2022-05-20 NOTE — Telephone Encounter (Signed)
Our previous documentation stated that he had 2 lumbar fusions. Record review shows they were cervical fusions. Left message for patient to return call to discuss/confirm if he has had any lumbar surgery.

## 2022-05-21 NOTE — Telephone Encounter (Signed)
Yes, please. I dont know that it will work because they only cover it for those 3 conditions, but the doctor that Cody Villarreal spoke to during the peer to peer suggested we try it anyway since he had such good relief with his trial. He had a different insurance when he had the trial.

## 2022-05-25 NOTE — Telephone Encounter (Signed)
[employer] (his primary insurance as of 05/30/22) denied the surgery. Dr Myer Haff plans to write a letter so we can file an appeal. I spoke with Madelaine Bhat and Darl Pikes with [employer]. They are going to reach out to [facility] with [employer] who has a template for an appeal letter. [employer] approved the surgery.  I left a 2nd message for the patient requesting he call me back so that I can notify him/discuss the above with him.

## 2022-05-31 ENCOUNTER — Encounter: Payer: Self-pay | Admitting: Neurosurgery

## 2022-05-31 NOTE — Telephone Encounter (Signed)
Faxed appeal to [employer] 

## 2022-05-31 NOTE — Telephone Encounter (Signed)
Patient has not returned my calls. I haven't heard from [facility] with [employer], but Dr Izora Ribas wrote an appeal letter, so I will go ahead and submit it to [facility].

## 2022-06-09 ENCOUNTER — Encounter
Admission: RE | Admit: 2022-06-09 | Discharge: 2022-06-09 | Disposition: A | Discharge status: Home / Self Care | Payer: Medicaid Other | Source: Ambulatory Visit | Attending: Neurosurgery | Admitting: Neurosurgery

## 2022-06-09 DIAGNOSIS — Z21 Asymptomatic human immunodeficiency virus [HIV] infection status: Secondary | ICD-10-CM | POA: Insufficient documentation

## 2022-06-09 DIAGNOSIS — Z01818 Encounter for other preprocedural examination: Secondary | ICD-10-CM | POA: Insufficient documentation

## 2022-06-09 DIAGNOSIS — M5020 Other cervical disc displacement, unspecified cervical region: Secondary | ICD-10-CM | POA: Diagnosis not present

## 2022-06-09 DIAGNOSIS — Z01810 Encounter for preprocedural cardiovascular examination: Secondary | ICD-10-CM

## 2022-06-09 DIAGNOSIS — M4722 Other spondylosis with radiculopathy, cervical region: Secondary | ICD-10-CM | POA: Insufficient documentation

## 2022-06-09 DIAGNOSIS — F17210 Nicotine dependence, cigarettes, uncomplicated: Secondary | ICD-10-CM | POA: Diagnosis not present

## 2022-06-09 DIAGNOSIS — B20 Human immunodeficiency virus [HIV] disease: Secondary | ICD-10-CM | POA: Insufficient documentation

## 2022-06-09 DIAGNOSIS — Z01812 Encounter for preprocedural laboratory examination: Secondary | ICD-10-CM

## 2022-06-09 LAB — CBC
HCT: 41.8 % (ref 39.0–52.0)
Hemoglobin: 13.7 g/dL (ref 13.0–17.0)
MCH: 27.3 pg (ref 26.0–34.0)
MCHC: 32.8 g/dL (ref 30.0–36.0)
MCV: 83.3 fL (ref 80.0–100.0)
Platelets: 312 10*3/uL (ref 150–400)
RBC: 5.02 MIL/uL (ref 4.22–5.81)
RDW: 14.1 % (ref 11.5–15.5)
WBC: 6 10*3/uL (ref 4.0–10.5)
nRBC: 0 % (ref 0.0–0.2)

## 2022-06-09 LAB — URINALYSIS, COMPLETE (UACMP) WITH MICROSCOPIC
Bacteria, UA: NONE SEEN
Bilirubin Urine: NEGATIVE
Glucose, UA: NEGATIVE mg/dL
Hgb urine dipstick: NEGATIVE
Ketones, ur: NEGATIVE mg/dL
Leukocytes,Ua: NEGATIVE
Nitrite: NEGATIVE
Protein, ur: NEGATIVE mg/dL
Specific Gravity, Urine: 1.026 (ref 1.005–1.030)
pH: 6 (ref 5.0–8.0)

## 2022-06-09 LAB — BASIC METABOLIC PANEL
Anion gap: 13 (ref 5–15)
BUN: 9 mg/dL (ref 6–20)
CO2: 23 mmol/L (ref 22–32)
Calcium: 9.2 mg/dL (ref 8.9–10.3)
Chloride: 103 mmol/L (ref 98–111)
Creatinine, Ser: 0.99 mg/dL (ref 0.61–1.24)
GFR, Estimated: >60 mL/min (ref >60)
Glucose, Bld: 111 mg/dL — ABNORMAL HIGH (ref 70–99)
Potassium: 3.9 mmol/L (ref 3.5–5.1)
Sodium: 139 mmol/L (ref 135–145)

## 2022-06-09 LAB — SURGICAL PCR SCREEN
MRSA, PCR: NEGATIVE
Staphylococcus aureus: NEGATIVE

## 2022-06-10 ENCOUNTER — Inpatient Hospital Stay: Admission: RE | Admit: 2022-06-10 | Payer: Medicaid Other | Source: Ambulatory Visit

## 2022-06-10 HISTORY — DX: Gastro-esophageal reflux disease without esophagitis: K21.9

## 2022-06-13 NOTE — Telephone Encounter (Signed)
Attempted to call [facility] for update, but they are closed today

## 2022-06-14 NOTE — Telephone Encounter (Signed)
I contacted [facility]. Decision for denial after peer to peer and level 1 appeal is upheld for the same reason as before... Under [facility] policy, the device is only covered for the following 3 reasons:  1. 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 Complex regional pain syndrome (CRPS) 3. Painful diabetic neuropathy  There is an option for a 2nd level appeal if you want to pursue that.    Please advise if you think it is worth pursuing a 2nd level appeal.  Also, what other recommendations do you have for him?

## 2022-06-15 NOTE — Telephone Encounter (Signed)
Message Received: Today Meade Maw, MD  Berdine Addison, RN Looks like we'll have ot cxl unless he wants to pay out of pocket - I can't get them to overturn this.

## 2022-06-15 NOTE — Telephone Encounter (Signed)
I spoke with Cody Villarreal about his options. He asked me to give him 24 hours to make some calls and for him to think about his options before canceling his surgery. He will call me back.

## 2022-06-16 ENCOUNTER — Telehealth: Payer: Self-pay

## 2022-06-16 ENCOUNTER — Encounter
Admission: RE | Admit: 2022-06-16 | Discharge: 2022-06-16 | Disposition: A | Discharge status: Home / Self Care | Payer: Medicaid Other | Source: Ambulatory Visit | Attending: Neurosurgery | Admitting: Neurosurgery

## 2022-06-16 HISTORY — DX: Nicotine dependence, cigarettes, uncomplicated: F17.210

## 2022-06-16 HISTORY — DX: Other cervical disc displacement, unspecified cervical region: M50.20

## 2022-06-16 HISTORY — DX: Chronic pain syndrome: G89.4

## 2022-06-16 HISTORY — DX: Other specified abnormal immunological findings in serum: R76.8

## 2022-06-16 NOTE — Telephone Encounter (Signed)
(  See telephone call from 05/18/22 for additional information).

## 2022-06-16 NOTE — Telephone Encounter (Signed)
-----  Message from Peggyann Shoals sent at 06/16/2022  1:08 PM EST ----- Regarding: prior auth Contact: [phone] He is calling that he only has [employer]. Is his surgery still set for next week?

## 2022-06-16 NOTE — Telephone Encounter (Signed)
I spoke with Cody Villarreal. Because the [place] just started on 05/30/22 and he has proof that he has [employer], his job said he could cancel the [place] as long as he chooses to do so before 06/30/22. He notified them to terminate the policy. I discussed with him that he is still showing up as active on the [facility] portal. He will contact his job and [facility] tomorrow to inquire when the official termination date is for the [place] so that we can ensure that his surgery happens after that date. He confirmed that his [employer] policy is renewed for 8280. At this time, we will leave his surgery on the schedule for 06/22/2022. He will call me back tomorrow after he finds out the termination date for the [place].

## 2022-06-16 NOTE — Patient Instructions (Signed)
Your procedure is scheduled on:06-22-22 Wednesday Report to the Registration Desk on the 1st floor of the [facility].Then proceed to the 2nd floor Surgery Desk To find out your arrival time, please call [phone] between 1PM - 3PM on:06-21-22 Tuesday If your arrival time is 6:00 am, do not arrive prior to that time as the [facility] entrance doors do not open until 6:00 am.  REMEMBER: Instructions that are not followed completely may result in serious medical risk, up to and including death; or upon the discretion of your surgeon and anesthesiologist your surgery may need to be rescheduled.  Do not eat food after midnight the night before surgery.  No gum chewing, lozengers or hard candies.  You may however, drink CLEAR liquids up to 2 hours before you are scheduled to arrive for your surgery. Do not drink anything within 2 hours of your scheduled arrival time.  Clear liquids include: - water  - apple juice without pulp - gatorade (not RED colors) - black coffee or tea (Do NOT add milk or creamers to the coffee or tea) Do NOT drink anything that is not on this list  TAKE THESE MEDICATIONS THE MORNING OF SURGERY WITH A SIP OF WATER: -DULoxetine (CYMBALTA)  -GENVOYA   One week prior to surgery: Stop Anti-inflammatories (NSAIDS) such as Advil, Aleve, Ibuprofen, Motrin, Naproxen, Naprosyn and Aspirin based products such as Excedrin, Goodys Powder, BC Powder.You may however, take Tylenol if needed for pain up until the day of surgery.  Stop ANY OVER THE COUNTER supplements/vitamins NOW (06-16-22) until after surgery.  No Alcohol for 24 hours before or after surgery.  No Smoking including e-cigarettes for 24 hours prior to surgery.  No chewable tobacco products for at least 6 hours prior to surgery.  No nicotine patches on the day of surgery.  Do not use any "recreational" drugs for at least a week prior to your surgery.  Please be advised that the combination of cocaine and  anesthesia may have negative outcomes, up to and including death. If you test positive for cocaine, your surgery will be cancelled.  On the morning of surgery brush your teeth with toothpaste and water, you may rinse your mouth with mouthwash if you wish. Do not swallow any toothpaste or mouthwash.  Do not wear jewelry, make-up, hairpins, clips or nail polish.  Do not wear lotions, powders, or perfumes.   Do not shave body from the neck down 48 hours prior to surgery just in case you cut yourself which could leave a site for infection.  Also, freshly shaved skin may become irritated if using the CHG soap.  Contact lenses, hearing aids and dentures may not be worn into surgery.  Do not bring valuables to the hospital. [facility] is not responsible for any missing/lost belongings or valuables.    Notify your doctor if there is any change in your medical condition (cold, fever, infection).  Wear comfortable clothing (specific to your surgery type) to the hospital.  After surgery, you can help prevent lung complications by doing breathing exercises.  Take deep breaths and cough every 1-2 hours. Your doctor may order a device called an Incentive Spirometer to help you take deep breaths. When coughing or sneezing, hold a pillow firmly against your incision with both hands. This is called "splinting." Doing this helps protect your incision. It also decreases belly discomfort.  If you are being admitted to the hospital overnight, leave your suitcase in the car. After surgery it may be  brought to your room.  If you are being discharged the day of surgery, you will not be allowed to drive home. You will need a responsible adult (18 years or older) to drive you home and stay with you that night.   If you are taking public transportation, you will need to have a responsible adult (18 years or older) with you. Please confirm with your physician that it is acceptable to use public transportation.    Please call the [facility] Dept. at [phone] if you have any questions about these instructions.  Surgery Visitation Policy:  Patients undergoing a surgery or procedure may have two family members or support persons with them as long as the person is not COVID-19 positive or experiencing its symptoms.   Due to an increase in RSV and influenza rates and associated hospitalizations, children ages 26 and under will not be able to visit patients in [facility]. Masks continue to be strongly recommended.

## 2022-06-17 NOTE — Telephone Encounter (Signed)
See telephone call from 06/16/22

## 2022-06-17 NOTE — Telephone Encounter (Signed)
-----  Message from [place] sent at 06/17/2022 11:20 AM EST ----- Regarding: Insurance Coverage terminated HR department confirmed insurance is no longer effective 06/16/2022.

## 2022-06-17 NOTE — Telephone Encounter (Addendum)
I spoke with Cody Villarreal. He reports that HR and [employer] confirmed his policy is officially terminated as of yesterday. The [employer] portal confirms that he is not eligible today. He confirmed that his [employer] is still active and the [employer] portal confirms it is active through 09/27/22. [employer] has already approved the surgery. I advised him that we will leave him on the OR schedule as planned for 06/22/22.

## 2022-06-22 ENCOUNTER — Ambulatory Visit: Payer: Medicaid Other | Admitting: Urgent Care

## 2022-06-22 ENCOUNTER — Encounter
Admission: RE | Disposition: A | Discharge status: Home / Self Care | Payer: Self-pay | Source: Home / Self Care | Attending: Neurosurgery

## 2022-06-22 ENCOUNTER — Other Ambulatory Visit: Payer: Self-pay

## 2022-06-22 ENCOUNTER — Encounter: Payer: Self-pay | Admitting: Neurosurgery

## 2022-06-22 ENCOUNTER — Ambulatory Visit: Payer: Medicaid Other

## 2022-06-22 ENCOUNTER — Ambulatory Visit
Admission: RE | Admit: 2022-06-22 | Discharge: 2022-06-22 | Disposition: A | Discharge status: Home / Self Care | Payer: Medicaid Other | Attending: Neurosurgery | Admitting: Neurosurgery

## 2022-06-22 DIAGNOSIS — B20 Human immunodeficiency virus [HIV] disease: Secondary | ICD-10-CM

## 2022-06-22 DIAGNOSIS — Z01812 Encounter for preprocedural laboratory examination: Secondary | ICD-10-CM

## 2022-06-22 DIAGNOSIS — Z01810 Encounter for preprocedural cardiovascular examination: Secondary | ICD-10-CM

## 2022-06-22 DIAGNOSIS — M3500 Sicca syndrome, unspecified: Secondary | ICD-10-CM | POA: Diagnosis not present

## 2022-06-22 DIAGNOSIS — G894 Chronic pain syndrome: Secondary | ICD-10-CM | POA: Insufficient documentation

## 2022-06-22 DIAGNOSIS — M5020 Other cervical disc displacement, unspecified cervical region: Secondary | ICD-10-CM

## 2022-06-22 DIAGNOSIS — Z01818 Encounter for other preprocedural examination: Secondary | ICD-10-CM

## 2022-06-22 DIAGNOSIS — M1990 Unspecified osteoarthritis, unspecified site: Secondary | ICD-10-CM | POA: Diagnosis not present

## 2022-06-22 DIAGNOSIS — M4722 Other spondylosis with radiculopathy, cervical region: Secondary | ICD-10-CM

## 2022-06-22 DIAGNOSIS — Z981 Arthrodesis status: Secondary | ICD-10-CM

## 2022-06-22 DIAGNOSIS — F17210 Nicotine dependence, cigarettes, uncomplicated: Secondary | ICD-10-CM | POA: Diagnosis not present

## 2022-06-22 DIAGNOSIS — K219 Gastro-esophageal reflux disease without esophagitis: Secondary | ICD-10-CM | POA: Insufficient documentation

## 2022-06-22 HISTORY — PX: THORACIC LAMINECTOMY FOR SPINAL CORD STIMULATOR: SHX6887

## 2022-06-22 SURGERY — THORACIC LAMINECTOMY FOR SPINAL CORD STIMULATOR
Anesthesia: General | Site: Spine Thoracic

## 2022-06-22 MED ORDER — 0.9 % SODIUM CHLORIDE (POUR BTL) OPTIME
TOPICAL | Status: DC | PRN
  Administered 2022-06-22: 500 mL

## 2022-06-22 MED ORDER — FENTANYL CITRATE (PF) 100 MCG/2ML IJ SOLN
INTRAMUSCULAR | Status: DC | PRN
  Administered 2022-06-22 (×2): 50 ug via INTRAVENOUS

## 2022-06-22 MED ORDER — FAMOTIDINE 20 MG PO TABS
ORAL_TABLET | ORAL | Status: AC
  Administered 2022-06-22: 20 mg via ORAL
  Filled 2022-06-22: qty 1

## 2022-06-22 MED ORDER — DEXAMETHASONE SODIUM PHOSPHATE 10 MG/ML IJ SOLN
INTRAMUSCULAR | Status: DC | PRN
  Administered 2022-06-22: 10 mg via INTRAVENOUS

## 2022-06-22 MED ORDER — SUCCINYLCHOLINE CHLORIDE 200 MG/10ML IV SOSY
PREFILLED_SYRINGE | INTRAVENOUS | Status: DC | PRN
  Administered 2022-06-22: 80 mg via INTRAVENOUS

## 2022-06-22 MED ORDER — ONDANSETRON HCL 4 MG/2ML IJ SOLN: 4.0000 mg | Freq: Once | INTRAMUSCULAR | Status: DC | PRN

## 2022-06-22 MED ORDER — SURGIFLO WITH THROMBIN (HEMOSTATIC MATRIX KIT) OPTIME
TOPICAL | Status: DC | PRN
  Administered 2022-06-22: 1 via TOPICAL

## 2022-06-22 MED ORDER — METHOCARBAMOL 500 MG PO TABS: 500.0000 mg | ORAL_TABLET | Freq: Four times a day (QID) | ORAL | 0 refills | Status: AC

## 2022-06-22 MED ORDER — PROPOFOL 1000 MG/100ML IV EMUL
INTRAVENOUS | Status: AC
  Filled 2022-06-22: qty 100

## 2022-06-22 MED ORDER — OXYCODONE HCL 5 MG PO TABS
5.0000 mg | ORAL_TABLET | Freq: Once | ORAL | Status: AC | PRN
  Administered 2022-06-22: 5 mg via ORAL

## 2022-06-22 MED ORDER — MIDAZOLAM HCL 2 MG/2ML IJ SOLN
INTRAMUSCULAR | Status: DC | PRN
  Administered 2022-06-22: 2 mg via INTRAVENOUS

## 2022-06-22 MED ORDER — OXYCODONE-ACETAMINOPHEN 5-325 MG PO TABS: 1.0000 | ORAL_TABLET | ORAL | 0 refills | Status: AC | PRN

## 2022-06-22 MED ORDER — ACETAMINOPHEN 10 MG/ML IV SOLN
INTRAVENOUS | Status: AC
  Filled 2022-06-22: qty 100

## 2022-06-22 MED ORDER — FAMOTIDINE 20 MG PO TABS: 20.0000 mg | ORAL_TABLET | Freq: Once | ORAL | Status: AC

## 2022-06-22 MED ORDER — GLYCOPYRROLATE 0.2 MG/ML IJ SOLN
INTRAMUSCULAR | Status: DC | PRN
  Administered 2022-06-22: .2 mg via INTRAVENOUS

## 2022-06-22 MED ORDER — FENTANYL CITRATE (PF) 100 MCG/2ML IJ SOLN
INTRAMUSCULAR | Status: AC
  Filled 2022-06-22: qty 2

## 2022-06-22 MED ORDER — BUPIVACAINE LIPOSOME 1.3 % IJ SUSP
INTRAMUSCULAR | Status: AC
  Filled 2022-06-22: qty 20

## 2022-06-22 MED ORDER — PHENYLEPHRINE HCL-NACL 20-0.9 MG/250ML-% IV SOLN
INTRAVENOUS | Status: AC
  Filled 2022-06-22: qty 250

## 2022-06-22 MED ORDER — PROPOFOL 10 MG/ML IV BOLUS
INTRAVENOUS | Status: DC | PRN
  Administered 2022-06-22: 150 mg via INTRAVENOUS
  Administered 2022-06-22 (×2): 50 mg via INTRAVENOUS

## 2022-06-22 MED ORDER — PHENYLEPHRINE 80 MCG/ML (10ML) SYRINGE FOR IV PUSH (FOR BLOOD PRESSURE SUPPORT)
PREFILLED_SYRINGE | INTRAVENOUS | Status: DC | PRN
  Administered 2022-06-22 (×3): 80 ug via INTRAVENOUS

## 2022-06-22 MED ORDER — HYDROMORPHONE HCL 1 MG/ML IJ SOLN
INTRAMUSCULAR | Status: AC
  Administered 2022-06-22: 0.5 mg via INTRAVENOUS
  Filled 2022-06-22: qty 1

## 2022-06-22 MED ORDER — REMIFENTANIL HCL 1 MG IV SOLR
INTRAVENOUS | Status: AC
  Filled 2022-06-22: qty 1000

## 2022-06-22 MED ORDER — CHLORHEXIDINE GLUCONATE 0.12 % MT SOLN: 15.0000 mL | Freq: Once | OROMUCOSAL | Status: AC

## 2022-06-22 MED ORDER — METHOCARBAMOL 500 MG PO TABS
ORAL_TABLET | ORAL | Status: AC
  Administered 2022-06-22: 500 mg via ORAL
  Filled 2022-06-22: qty 1

## 2022-06-22 MED ORDER — MIDAZOLAM HCL 2 MG/2ML IJ SOLN
INTRAMUSCULAR | Status: AC
  Filled 2022-06-22: qty 2

## 2022-06-22 MED ORDER — HYDROMORPHONE HCL 1 MG/ML IJ SOLN
0.2500 mg | INTRAMUSCULAR | Status: DC | PRN
  Administered 2022-06-22: 0.5 mg via INTRAVENOUS

## 2022-06-22 MED ORDER — ONDANSETRON HCL 4 MG/2ML IJ SOLN
INTRAMUSCULAR | Status: DC | PRN
  Administered 2022-06-22 (×2): 4 mg via INTRAVENOUS

## 2022-06-22 MED ORDER — DEXMEDETOMIDINE HCL IN NACL 200 MCG/50ML IV SOLN
INTRAVENOUS | Status: DC | PRN
  Administered 2022-06-22: 12 ug via INTRAVENOUS

## 2022-06-22 MED ORDER — LIDOCAINE HCL (CARDIAC) PF 100 MG/5ML IV SOSY
PREFILLED_SYRINGE | INTRAVENOUS | Status: DC | PRN
  Administered 2022-06-22: 100 mg via INTRAVENOUS

## 2022-06-22 MED ORDER — OXYCODONE HCL 5 MG/5ML PO SOLN: 5.0000 mg | Freq: Once | ORAL | Status: AC | PRN

## 2022-06-22 MED ORDER — BUPIVACAINE-EPINEPHRINE (PF) 0.5% -1:200000 IJ SOLN
INTRAMUSCULAR | Status: DC | PRN
  Administered 2022-06-22: 4 mL

## 2022-06-22 MED ORDER — REMIFENTANIL HCL 1 MG IV SOLR
INTRAVENOUS | Status: DC | PRN
  Administered 2022-06-22: .2 ug/kg/min via INTRAVENOUS

## 2022-06-22 MED ORDER — OXYCODONE HCL 5 MG PO TABS
ORAL_TABLET | ORAL | Status: AC
  Filled 2022-06-22: qty 1

## 2022-06-22 MED ORDER — PROPOFOL 500 MG/50ML IV EMUL
INTRAVENOUS | Status: DC | PRN
  Administered 2022-06-22: 175 ug/kg/min via INTRAVENOUS

## 2022-06-22 MED ORDER — CHLORHEXIDINE GLUCONATE 0.12 % MT SOLN
OROMUCOSAL | Status: AC
  Administered 2022-06-22: 15 mL via OROMUCOSAL
  Filled 2022-06-22: qty 15

## 2022-06-22 MED ORDER — SODIUM CHLORIDE FLUSH 0.9 % IV SOLN
INTRAVENOUS | Status: AC
  Filled 2022-06-22: qty 20

## 2022-06-22 MED ORDER — LACTATED RINGERS IV SOLN: INTRAVENOUS | Status: DC

## 2022-06-22 MED ORDER — BUPIVACAINE-EPINEPHRINE (PF) 0.5% -1:200000 IJ SOLN
INTRAMUSCULAR | Status: AC
  Filled 2022-06-22: qty 30

## 2022-06-22 MED ORDER — CEFAZOLIN SODIUM-DEXTROSE 2-4 GM/100ML-% IV SOLN
2.0000 g | Freq: Once | INTRAVENOUS | Status: AC
  Administered 2022-06-22: 15 g via INTRAVENOUS

## 2022-06-22 MED ORDER — EPHEDRINE SULFATE (PRESSORS) 50 MG/ML IJ SOLN
INTRAMUSCULAR | Status: DC | PRN
  Administered 2022-06-22: 5 mg via INTRAVENOUS

## 2022-06-22 MED ORDER — PHENYLEPHRINE HCL-NACL 20-0.9 MG/250ML-% IV SOLN
INTRAVENOUS | Status: DC | PRN
  Administered 2022-06-22: 25 ug/min via INTRAVENOUS

## 2022-06-22 MED ORDER — VANCOMYCIN HCL IN DEXTROSE 1-5 GM/200ML-% IV SOLN: 1000.0000 mg | Freq: Once | INTRAVENOUS | Status: AC

## 2022-06-22 MED ORDER — METHOCARBAMOL 500 MG PO TABS: 500.0000 mg | ORAL_TABLET | Freq: Four times a day (QID) | ORAL | Status: DC

## 2022-06-22 MED ORDER — SODIUM CHLORIDE (PF) 0.9 % IJ SOLN
INTRAMUSCULAR | Status: DC | PRN
  Administered 2022-06-22: 60 mL via INTRAMUSCULAR

## 2022-06-22 MED ORDER — ACETAMINOPHEN 10 MG/ML IV SOLN
INTRAVENOUS | Status: DC | PRN
  Administered 2022-06-22: 1000 mg via INTRAVENOUS

## 2022-06-22 MED ORDER — BUPIVACAINE HCL (PF) 0.5 % IJ SOLN
INTRAMUSCULAR | Status: AC
  Filled 2022-06-22: qty 30

## 2022-06-22 MED ORDER — FENTANYL CITRATE (PF) 100 MCG/2ML IJ SOLN: 25.0000 ug | INTRAMUSCULAR | Status: DC | PRN

## 2022-06-22 MED ORDER — ORAL CARE MOUTH RINSE: 15.0000 mL | Freq: Once | OROMUCOSAL | Status: AC

## 2022-06-22 MED ORDER — VANCOMYCIN HCL 1000 MG IV SOLR: INTRAVENOUS | Status: DC | PRN

## 2022-06-22 MED ORDER — VANCOMYCIN HCL IN DEXTROSE 1-5 GM/200ML-% IV SOLN
INTRAVENOUS | Status: AC
  Administered 2022-06-22: 1000 mg via INTRAVENOUS
  Filled 2022-06-22: qty 200

## 2022-06-22 MED ORDER — CEFAZOLIN SODIUM-DEXTROSE 2-4 GM/100ML-% IV SOLN
INTRAVENOUS | Status: AC
  Filled 2022-06-22: qty 100

## 2022-06-22 MED ORDER — IRRISEPT - 450ML BOTTLE WITH 0.05% CHG IN STERILE WATER, USP 99.95% OPTIME
TOPICAL | Status: DC | PRN
  Administered 2022-06-22: 450 mL

## 2022-06-22 SURGICAL SUPPLY — 64 items
ADH SKN CLS APL DERMABOND .7 (GAUZE/BANDAGES/DRESSINGS) ×2
AGENT HMST KT MTR STRL THRMB (HEMOSTASIS) ×1
APL PRP STRL LF DISP 70% ISPRP (MISCELLANEOUS)
BUR NEURO DRILL SOFT 3.0X3.8M (BURR) ×1 IMPLANT
CHLORAPREP W/TINT 26 (MISCELLANEOUS) ×2 IMPLANT
CONTROLLER INTELLIS PTM PAPER (NEUROSURGERY SUPPLIES) IMPLANT
COUNTER NEEDLE 20/40 LG (NEEDLE) ×1 IMPLANT
COVER LIGHT HANDLE STERIS (MISCELLANEOUS) ×2 IMPLANT
CUP MEDICINE 2OZ PLAST GRAD ST (MISCELLANEOUS) ×1 IMPLANT
DERMABOND ADVANCED .7 DNX12 (GAUZE/BANDAGES/DRESSINGS) ×2 IMPLANT
DEVICE IMPLANT NEUROSTIMULATOR (Neuro Prosthesis/Implant) IMPLANT
DRAPE C ARM PK CFD 31 SPINE (DRAPES) ×1 IMPLANT
DRAPE LAPAROTOMY 100X77 ABD (DRAPES) ×1 IMPLANT
DRAPE SURG 17X11 SM STRL (DRAPES) ×1 IMPLANT
DRSG OPSITE POSTOP 4X6 (GAUZE/BANDAGES/DRESSINGS) ×1 IMPLANT
DRSG OPSITE POSTOP 4X8 (GAUZE/BANDAGES/DRESSINGS) ×1 IMPLANT
ELECT CAUTERY BLADE TIP 2.5 (TIP)
ELECT REM PT RETURN 9FT ADLT (ELECTROSURGICAL) ×1
ELECTRODE CAUTERY BLDE TIP 2.5 (TIP) ×1 IMPLANT
ELECTRODE REM PT RTRN 9FT ADLT (ELECTROSURGICAL) ×1 IMPLANT
FEE INTRAOP CADWELL SUPPLY NCS (MISCELLANEOUS) IMPLANT
FEE INTRAOP MONITOR IMPULS NCS (MISCELLANEOUS) IMPLANT
GAUZE 4X4 16PLY ~~LOC~~+RFID DBL (SPONGE) ×1 IMPLANT
GLOVE SURG SYN 6.5 ES PF (GLOVE) ×1 IMPLANT
GLOVE SURG SYN 6.5 PF PI (GLOVE) ×1 IMPLANT
GLOVE SURG SYN 8.5  E (GLOVE) ×3
GLOVE SURG SYN 8.5 E (GLOVE) ×3 IMPLANT
GLOVE SURG SYN 8.5 PF PI (GLOVE) ×3 IMPLANT
GLOVE SURG UNDER POLY LF SZ6.5 (GLOVE) ×1 IMPLANT
GLOVE SURG UNDER POLY LF SZ8.5 (GLOVE) ×1 IMPLANT
GOWN SRG LRG LVL 4 IMPRV REINF (GOWNS) ×1 IMPLANT
GOWN SRG XL LVL 3 NONREINFORCE (GOWNS) ×1 IMPLANT
GOWN STRL NON-REIN TWL XL LVL3 (GOWNS) ×1
GOWN STRL REIN LRG LVL4 (GOWNS)
GRADUATE 1200CC STRL 31836 (MISCELLANEOUS) ×1 IMPLANT
INTRAOP CADWELL SUPPLY FEE NCS (MISCELLANEOUS) ×1
INTRAOP DISP SUPPLY FEE NCS (MISCELLANEOUS) ×1
INTRAOP MONITOR FEE IMPULS NCS (MISCELLANEOUS) ×1
INTRAOP MONITOR FEE IMPULSE (MISCELLANEOUS) ×1
JET LAVAGE IRRISEPT WOUND (IRRIGATION / IRRIGATOR) ×1
KIT SPINAL PRONEVIEW (KITS) ×1 IMPLANT
KIT TURNOVER KIT A (KITS) ×1 IMPLANT
LAVAGE JET IRRISEPT WOUND (IRRIGATION / IRRIGATOR) IMPLANT
MANIFOLD NEPTUNE II (INSTRUMENTS) ×1 IMPLANT
MARKER SKIN DUAL TIP RULER LAB (MISCELLANEOUS) ×2 IMPLANT
NDL SAFETY ECLIP 18X1.5 (MISCELLANEOUS) ×1 IMPLANT
NS IRRIG 1000ML POUR BTL (IV SOLUTION) ×1 IMPLANT
NS IRRIG 500ML POUR BTL (IV SOLUTION) IMPLANT
PACK LAMINECTOMY NEURO (CUSTOM PROCEDURE TRAY) ×1 IMPLANT
RECHARGER INTELLIS (NEUROSURGERY SUPPLIES) IMPLANT
SOLUTION IRRIG SURGIPHOR (IV SOLUTION) ×1 IMPLANT
STAPLER SKIN PROX 35W (STAPLE) ×1 IMPLANT
STIMULATOR CORD SURESCAN MRI (Stimulator) IMPLANT
SURGIFLO W/THROMBIN 8M KIT (HEMOSTASIS) ×1 IMPLANT
SUT DVC VLOC 3-0 CL 6 P-12 (SUTURE) ×1 IMPLANT
SUT SILK 2 0SH CR/8 30 (SUTURE) ×1 IMPLANT
SUT VIC AB 0 CT1 18XCR BRD 8 (SUTURE) ×1 IMPLANT
SUT VIC AB 0 CT1 8-18 (SUTURE) ×2
SUT VIC AB 2-0 CT1 18 (SUTURE) ×1 IMPLANT
SYR 10ML LL (SYRINGE) ×1 IMPLANT
SYR 30ML LL (SYRINGE) ×2 IMPLANT
TOWEL OR 17X26 4PK STRL BLUE (TOWEL DISPOSABLE) ×3 IMPLANT
TRAP FLUID SMOKE EVACUATOR (MISCELLANEOUS) ×1 IMPLANT
TUBING CONNECTING 10 (TUBING) ×1 IMPLANT

## 2022-06-22 NOTE — Addendum Note (Signed)
Addendum  created 06/22/22 1127 by Kelton Pillar, CRNA   Flowsheet accepted

## 2022-06-22 NOTE — Discharge Summary (Signed)
Discharge Summary  Patient ID: Cody Villarreal MRN: [id number] DOB/AGE: 03-Oct-1985 37 y.o.  Admit date: 06/22/2022 Discharge date: 06/22/2022  Admission Diagnoses: Chronic pain   Discharge Diagnoses:  Active Problems:   * No active hospital problems. *   Discharged Condition: good  Hospital Course:  Cody Villarreal isa 37 y.o presenting with chronic pain s/p placement of spinal cord stimulator. His intraoperative course was uncomplicated. He was monitored in PACU and discharged home after ambulating, urinating, and tolerating PO intake.   Consults: None  Significant Diagnostic Studies: none  Treatments: surgery: as above. Please see separately dictated operative report for further details.   Discharge Exam: Blood pressure 112/71, pulse 70, temperature (!) 96.9 F (36.1 C), temperature source Temporal, resp. rate 18, height 5\' 9"  (1.753 m), weight 68.4 kg, SpO2 100 %. CN II-XII grossly intact 5/5 throughout  Disposition: Discharge disposition: 01-Home or Self Care        Allergies as of 06/22/2022       Reactions   Amoxicillin Hives   Prednisone Nausea And Vomiting        Medication List     TAKE these medications    DULoxetine 30 MG capsule Commonly known as: CYMBALTA Take 60 mg by mouth 3 (three) times daily.   fluticasone 50 MCG/ACT nasal spray Commonly known as: FLONASE Place 1 spray into both nostrils as needed for allergies or rhinitis.   Genvoya 150-150-200-10 MG Tabs tablet Generic drug: elvitegravir-cobicistat-emtricitabine-tenofovir Take 1 tablet by mouth daily with breakfast.   methocarbamol 500 MG tablet Commonly known as: ROBAXIN Take 1 tablet (500 mg total) by mouth 4 (four) times daily.   oxyCODONE-acetaminophen 5-325 MG tablet Commonly known as: Percocet Take 1 tablet by mouth every 4 (four) hours as needed for up to 5 days.         Signed: Loleta Dicker 06/22/2022, 9:15 AM

## 2022-06-22 NOTE — Progress Notes (Signed)
Pt ambulated in hall of post op with steady gait. Has drank water and eaten graham crackers.  Pt has urinated prior to this RN.  Ice being used by pt.  No extremity weakness noted at this time.

## 2022-06-22 NOTE — Discharge Instructions (Addendum)
  NEUROSURGERY DISCHARGE INSTRUCTIONS  Admission diagnosis: G89.4 chronic pain syndrome  Operative procedure: Placement of spinal cord stimulator  What to do after you leave the hospital:  Recommended diet: regular diet. Increase protein intake to promote wound healing.  Recommended activity: no lifting, driving, or strenuous exercise for 4 weeks . You should walk multiple times per day  Special Instructions  No straining, no heavy lifting > 10lbs x 4 weeks.  Keep incision area clean and dry. May shower in 2 days. No baths or pools for 6 weeks.  Please remove dressing tomorrow, no need to apply a bandage afterwards  You have no sutures to remove, the skin is closed with adhesive  Please take pain medications as directed. Take a stool softener if on pain medications   Please Report any of the following: Nausea or Vomiting, Temperature is greater than 101.40F (38.1C) degrees, Dizziness, Abdominal Pain, Difficulty Breathing or Shortness of Breath, Inability to Eat, drink Fluids, or Take medications, Bleeding, swelling, or drainage from surgical incision sites, New numbness or weakness, and Bowel or bladder dysfunction to the neurosurgeon on call at [phone]  Additional Follow up appointments Please follow up with Cooper Render PA-C in [facility] clinic as scheduled in 2-3 weeks   Please see below for scheduled appointments:  Future Appointments  Date Time Provider [place]  07/07/2022 11:30 AM Loleta Dicker, PA [facility] None  08/04/2022  9:45 AM Meade Maw, MD [facility] None  08/18/2022  8:00 AM Collier Salina, MD [facility] None    AMBULATORY SURGERY  DISCHARGE INSTRUCTIONS   The drugs that you were given will stay in your system until tomorrow so for the next 24 hours you should not:  Drive an automobile Make any legal decisions Drink any alcoholic beverage   You may resume regular meals tomorrow.  Today it is better to start with liquids and gradually work  up to solid foods.  You may eat anything you prefer, but it is better to start with liquids, then soup and crackers, and gradually work up to solid foods.   Please notify your doctor immediately if you have any unusual bleeding, trouble breathing, redness and pain at the surgery site, drainage, fever, or pain not relieved by medication.    Additional Instructions:        Please contact your physician with any problems or Same Day Surgery at [phone], Monday through Friday 6 am to 4 pm, or [place] at [facility] number at [phone].

## 2022-06-22 NOTE — Transfer of Care (Signed)
Immediate Anesthesia Transfer of Care Note  Patient: Cody Villarreal  Procedure(s) Performed: THORACIC LAMINECTOMY FOR SPINAL CORD STIMULATOR PLACEMENT ([employer]) (Spine Thoracic)  Patient Location: PACU  Anesthesia Type:General  Level of Consciousness: drowsy and patient cooperative  Airway & Oxygen Therapy: Patient Spontanous Breathing and Patient connected to face mask oxygen  Post-op Assessment: Report given to RN and Post -op Vital signs reviewed and stable  Post vital signs: Reviewed and stable  Last Vitals:  Vitals Value Taken Time  BP    Temp    Pulse 72 06/22/22 0923  Resp 15 06/22/22 0923  SpO2 100 % 06/22/22 0923  Vitals shown include unvalidated device data.  Last Pain:  Vitals:   06/22/22 0629  TempSrc: Temporal  PainSc: 8          Complications: No notable events documented.

## 2022-06-22 NOTE — H&P (Signed)
Referring Physician:  No referring provider defined for this encounter.  Primary Physician:  Loyola Mast, PA-C  History of Present Illness: 06/22/2022 Mr. Noon presents today for surgical intervention for placement of a spinal cord stimulator.  05/10/2022 Mr. Ekansh Sherk is here today with a chief complaint of bilateral leg pain from behind his knees to the bottom of his feet.  He has had chronic pain for several years.  He had spinal surgery in 2015 and 2017 with only short-term relief.  He is here to discuss a permanent spinal cord stimulator. He had the trial on 04/18/22 to 04/25/22 with [employer]. He reports 70-80% relief during the trial.  Psych clearance was completed on 01/27/22. He has also had a Thoracic MRI.    Past Surgery: spinal fusion in 2015 and 2017 by Dr Nancy Fetter  Belinda Fisher has no symptoms of cervical myelopathy.  The symptoms are causing a significant impact on the patient's life.   I have utilized the care everywhere function in epic to review the outside records available from external health systems.  Review of Systems:  A 10 point review of systems is negative, except for the pertinent positives and negatives detailed in the HPI.  Past Medical History: Past Medical History:  Diagnosis Date   ANA positive    Cervical disc herniation    Chronic pain disorder    Cigarette smoker    GERD (gastroesophageal reflux disease)    h/o   HIV disease ([place]) 2013   Neuropathy    Patella-femoral syndrome    Sjogren's disease ([place])     Past Surgical History: Past Surgical History:  Procedure Laterality Date   BACK SURGERY     x2 2014/2017    Allergies: Allergies as of 05/12/2022 - Review Complete 05/10/2022  Allergen Reaction Noted   Amoxicillin Hives 06/28/2018   Prednisone Nausea And Vomiting 04/30/2021    Medications: Current Meds  Medication Sig   DULoxetine (CYMBALTA) 30 MG capsule Take 60 mg by mouth 3 (three) times daily.    fluticasone (FLONASE) 50 MCG/ACT nasal spray Place 1 spray into both nostrils as needed for allergies or rhinitis.   GENVOYA 150-150-200-10 MG TABS tablet Take 1 tablet by mouth daily with breakfast.    Social History: Social History   Tobacco Use   Smoking status: Every Day    Packs/day: 0.50    Years: 18.00    Total pack years: 9.00    Types: Cigarettes   Smokeless tobacco: Never  Vaping Use   Vaping Use: Some days   Substances: Nicotine, Flavoring  Substance Use Topics   Alcohol use: No   Drug use: No    Family Medical History: Family History  Problem Relation Age of Onset   Breast cancer Mother    Healthy Father     Physical Examination: Vitals:   06/22/22 0629  BP: 112/71  Pulse: 70  Resp: 18  Temp: (!) 96.9 F (36.1 C)  SpO2: 100%   Heart sounds normal no MRG. Chest Clear to Auscultation Bilaterally.   General: Patient is well developed, well nourished, calm, collected, and in no apparent distress. Attention to examination is appropriate.  Neck:   Supple.  Full range of motion.  Respiratory: Patient is breathing without any difficulty.   NEUROLOGICAL:     Awake, alert, oriented to person, place, and time.  Speech is clear and fluent. Fund of knowledge is appropriate.   Cranial Nerves: Pupils equal round and reactive to light.  Facial tone is symmetric.  Facial sensation is symmetric. Shoulder shrug is symmetric. Tongue protrusion is midline.  There is no pronator drift.  ROM of spine: full.    Strength: Side Biceps Triceps Deltoid Interossei Grip Wrist Ext. Wrist Flex.  R 5 5 5 5 5 5 5   L 5 5 5 5 5 5 5    Side Iliopsoas Quads Hamstring PF DF EHL  R 5 5 5 5 5 5   L 5 5 5 5 5 5    Reflexes are 1+ and symmetric at the biceps, triceps, brachioradialis, patella and achilles.   Hoffman's is absent.   Bilateral upper and lower extremity sensation is intact to light touch.    No evidence of dysmetria noted.  Gait is normal.     Medical Decision  Making  Imaging: MRI thoracic spine reviewed from March 14, 2022.  There are no significant findings of stenosis at any level.  I have personally reviewed the images and agree with the above interpretation.  Assessment and Plan: Mr. Colgate is a pleasant 37 y.o. male with chronic pain syndrome.  He has undergone a successful spinal cord stimulator evaluation including a psychologic evaluation and this felt to be a good candidate for placement of a permanent device.    We will move forward with placement of a [employer] spinal cord stimulator.  Risks and benefits reviewed in the clinic.    Deitra Craine K. Izora Ribas MD, [facility] Neurosurgery

## 2022-06-22 NOTE — Anesthesia Preprocedure Evaluation (Signed)
Anesthesia Evaluation  Patient identified by MRN, date of birth, ID band Patient awake    Reviewed: Allergy & Precautions, NPO status , Patient's Chart, lab work & pertinent test results  History of Anesthesia Complications Negative for: history of anesthetic complications  Airway Mallampati: II  TM Distance: >3 FB Neck ROM: Full    Dental no notable dental hx. (+) Teeth Intact   Pulmonary neg sleep apnea, neg COPD, Current SmokerPatient did not abstain from smoking.   Pulmonary exam normal breath sounds clear to auscultation       Cardiovascular Exercise Tolerance: Good METS(-) hypertension(-) CAD and (-) Past MI negative cardio ROS (-) dysrhythmias  Rhythm:Regular Rate:Normal - Systolic murmurs    Neuro/Psych Not on chronic opioids  Neuromuscular disease  negative psych ROS   GI/Hepatic ,GERD  Controlled,,(+)     (-) substance abuse    Endo/Other  neg diabetes    Renal/GU negative Renal ROS     Musculoskeletal  (+) Arthritis ,    Abdominal   Peds  Hematology  (+) HIVUndetectable viral load per patient   Anesthesia Other Findings Past Medical History: No date: ANA positive No date: Cervical disc herniation No date: Chronic pain disorder No date: Cigarette smoker No date: GERD (gastroesophageal reflux disease)     Comment:  h/o 2013: HIV disease ([place]) No date: Neuropathy No date: Patella-femoral syndrome No date: Sjogren's disease ([facility])  Reproductive/Obstetrics                             Anesthesia Physical Anesthesia Plan  ASA: 2  Anesthesia Plan: General   Post-op Pain Management: Ofirmev IV (intra-op)* and Dilaudid IV   Induction: Intravenous  PONV Risk Score and Plan: 2 and Ondansetron, Dexamethasone and Midazolam  Airway Management Planned: Oral ETT  Additional Equipment: None  Intra-op Plan:   Post-operative Plan: Extubation in OR  Informed Consent: I  have reviewed the patients History and Physical, chart, labs and discussed the procedure including the risks, benefits and alternatives for the proposed anesthesia with the patient or authorized representative who has indicated his/her understanding and acceptance.     Dental advisory given  Plan Discussed with: CRNA and Surgeon  Anesthesia Plan Comments: (Discussed risks of anesthesia with patient, including PONV, sore throat, lip/dental/eye damage. Rare risks discussed as well, such as cardiorespiratory and neurological sequelae, and allergic reactions. Discussed the role of CRNA in patient's perioperative care. Patient understands. Patient counseled on benefits of smoking cessation, and increased perioperative risks associated with continued smoking. )       Anesthesia Quick Evaluation

## 2022-06-22 NOTE — Op Note (Signed)
Indications: the patient is a 37 yo male who was diagnosed with chronic pain syndrome. The patient had a successful trial for spinal cord stimulation, so was consented for placement of a permanent device   Findings: successful placement of a [employer] spinal cord stimulator.   Preoperative Diagnosis: chronic pain syndrome Postoperative Diagnosis: same     EBL: 50 ml IVF: see AR ml Drains: none Disposition: Extubated and Stable to PACU Complications: none   No foley catheter was placed.     Preoperative Note:    Risks of surgery discussed in clinic.   Operative Note:      The patient was then brought from the preoperative center with intravenous access established.  The patient underwent general anesthesia and endotracheal tube intubation, then was rotated on the [facility] table where all pressure points were appropriately padded.  An incision was marked with flouroscopy at T9/10, and on the right flank. The skin was then thoroughly cleansed.  Perioperative antibiotic prophylaxis was administered.  Sterile prep and drapes were then applied and a timeout was then observed.     Once this was complete an incision was opened with the use of a #10 blade knife in the midline at the thoracic incision.  The paraspinus muscled were subperiosteally dissected until the laminae of T10 and T11 were visualized. Flouroscopy was used to confirm the level. A self-retaining retractor was placed.   The rongeur was used to remove the spinous process of T10.  The drill was used to thin the bone until the ligamentum flavum was visualized.  The ligamentum was then removed and the dura visualized. This was widened until placement of the paddle lead was possible.     The lead was then advanced to the T8/9 disc space at the top of the lead.  The lead was secured with a 2-0 silk suture.     The incision on the flank was then opened and a pocket formed until it was large enough for the pulse generator.  The  tunneler was used to connect between the pocket and the incision.  The lead was inserted into the tunneler and tunneled to the buttock.  The leads were attached to the IPG and impedances checked.  The leads were then tightened.  The IPG was then inserted into the pouch.   Both sites were irrigated.  The wounds were closed in layers with 0 and 2-0 vicryl.  The skin was approximated with monocryl. A sterile dressing was applied.   Monitoring was stable throughout.   Patient was then rotated back to the preoperative bed awakened from anesthesia and taken to recovery. All counts are correct in this case.   I performed the entire procedure with the assistance of Cooper Render PA as an Pensions consultant. An assistant was required for this procedure due to the complexity.  The assistant provided assistance in tissue manipulation and suction, and was required for the successful and safe performance of the procedure. I performed the critical portions of the procedure.      Meade Maw MD

## 2022-06-22 NOTE — Anesthesia Postprocedure Evaluation (Signed)
Anesthesia Post Note  Patient: Cody Villarreal  Procedure(s) Performed: THORACIC LAMINECTOMY FOR SPINAL CORD STIMULATOR PLACEMENT ([employer]) (Spine Thoracic)  Patient location during evaluation: PACU Anesthesia Type: General Level of consciousness: awake and alert Pain management: pain level controlled Vital Signs Assessment: post-procedure vital signs reviewed and stable Respiratory status: spontaneous breathing, nonlabored ventilation, respiratory function stable and patient connected to nasal cannula oxygen Cardiovascular status: blood pressure returned to baseline and stable Postop Assessment: no apparent nausea or vomiting Anesthetic complications: no   No notable events documented.   Last Vitals:  Vitals:   06/22/22 1030 06/22/22 1040  BP: 124/65   Pulse: 76 86  Resp: 17 16  Temp:  36.6 C  SpO2: 91% 97%    Last Pain:  Vitals:   06/22/22 1040  TempSrc:   PainSc: 3                  Arita Miss

## 2022-06-22 NOTE — Anesthesia Procedure Notes (Signed)
Procedure Name: Intubation Date/Time: 06/22/2022 7:19 AM  Performed by: Kelton Pillar, CRNAPre-anesthesia Checklist: Patient identified, Emergency Drugs available, Suction available and Patient being monitored Patient Re-evaluated:Patient Re-evaluated prior to induction Oxygen Delivery Method: Circle system utilized Preoxygenation: Pre-oxygenation with 100% oxygen Induction Type: IV induction Ventilation: Mask ventilation without difficulty Laryngoscope Size: McGraph and 3 Grade View: Grade I Tube type: Oral Tube size: 7.0 mm Number of attempts: 1 Airway Equipment and Method: Stylet and Oral airway Placement Confirmation: ETT inserted through vocal cords under direct vision, positive ETCO2, breath sounds checked- equal and bilateral and CO2 detector Secured at: 21 cm Tube secured with: Tape Dental Injury: Teeth and Oropharynx as per pre-operative assessment

## 2022-06-23 ENCOUNTER — Encounter: Payer: Self-pay | Admitting: Neurosurgery

## 2022-07-06 NOTE — Progress Notes (Unsigned)
   REFERRING PHYSICIAN:  Loyola Mast, [address] [place],  [place] [address]  DOS: 06/22/22 placement of SCC  HISTORY OF PRESENT ILLNESS: Cody Villarreal is approximately 2 weeks status post SCS placement. he is doing fairly well postoperatively.  He does report a pulling in his left leg that radiates into his groin at night and attributes this to having to sleep on his side due to discomfort in his lateral surgical site.  He denies any incisional concerns and is otherwise doing well.  He states overall he is better than he was preoperatively.  PHYSICAL EXAMINATION:  General: Patient is well developed, well nourished, calm, collected, and in no apparent distress.   NEUROLOGICAL:  General: In no acute distress.   Awake, alert, oriented to person, place, and time.  Pupils equal round and reactive to light.  Facial tone is symmetric.  Tongue protrusion is midline.  There is no pronator drift.   Strength:            Side Iliopsoas Quads Hamstring PF DF EHL  R 5 5 5 5 5 5   L 5 5 5 5 5 5    Incisions c/d/I and healing well   ROS (Neurologic):  Negative except as noted above  IMAGING: No interval imaging to review  ASSESSMENT/PLAN:  Cody Villarreal is doing well approximately 2 weeks after SCS placement.  I gave him suggestions on different ways to sleep in order to hopefully help with his discomfort at night.  Cody Villarreal is here today to make adjustments to his stimulator settings.  We discussed activity escalation and I have advised the patient to lift up to 10 pounds until 6 weeks after surgery, then increase up to 25 pounds until 12 weeks after surgery.  After 12 weeks post-op, the patient advised to increase activity as tolerated.  he will follow up with Dr. Izora Ribas in 4 weeks or sooner should he have any questions or concerns. He expressed understanding and was in agreement with this plan  Advised to contact the office if any questions or concerns  arise.  Cody Render PA-C Department of neurosurgery

## 2022-07-07 ENCOUNTER — Encounter: Payer: Self-pay | Admitting: Neurosurgery

## 2022-07-07 ENCOUNTER — Ambulatory Visit (INDEPENDENT_AMBULATORY_CARE_PROVIDER_SITE_OTHER): Payer: Medicaid Other | Admitting: Neurosurgery

## 2022-07-07 VITALS — BP 120/79 | HR 81 | Temp 98.4°F | Ht 69.0 in | Wt 149.8 lb

## 2022-07-07 DIAGNOSIS — Z9689 Presence of other specified functional implants: Secondary | ICD-10-CM

## 2022-07-07 DIAGNOSIS — G894 Chronic pain syndrome: Secondary | ICD-10-CM

## 2022-07-07 DIAGNOSIS — Z09 Encounter for follow-up examination after completed treatment for conditions other than malignant neoplasm: Secondary | ICD-10-CM

## 2022-08-04 ENCOUNTER — Ambulatory Visit (INDEPENDENT_AMBULATORY_CARE_PROVIDER_SITE_OTHER): Payer: Medicaid Other | Admitting: Neurosurgery

## 2022-08-04 ENCOUNTER — Encounter: Payer: Self-pay | Admitting: Neurosurgery

## 2022-08-04 VITALS — BP 108/68 | Ht 69.0 in | Wt 142.6 lb

## 2022-08-04 DIAGNOSIS — G894 Chronic pain syndrome: Secondary | ICD-10-CM

## 2022-08-04 DIAGNOSIS — Z09 Encounter for follow-up examination after completed treatment for conditions other than malignant neoplasm: Secondary | ICD-10-CM

## 2022-08-04 NOTE — Progress Notes (Signed)
   REFERRING PHYSICIAN:  Loyola Mast, [place],  [place] [id number]  DOS: 06/22/22 placement of SCC  HISTORY OF PRESENT ILLNESS: Cody Villarreal is status post SCS placement.  He is doing much better than before surgery.  He still has some stiffness in his back.  His leg pain is much improved.  He is very happy with his improvement.    PHYSICAL EXAMINATION:  General: Patient is well developed, well nourished, calm, collected, and in no apparent distress.   NEUROLOGICAL:  General: In no acute distress.   Awake, alert, oriented to person, place, and time.  Pupils equal round and reactive to light.  Facial tone is symmetric.  Tongue protrusion is midline.  There is no pronator drift.   Strength:            Side Iliopsoas Quads Hamstring PF DF EHL  R '5 5 5 5 5 5  '$ L '5 5 5 5 5 5   '$ Incisions c/d/I and healing well   ROS (Neurologic):  Negative except as noted above  IMAGING: No interval imaging to review  ASSESSMENT/PLAN:  Cody Villarreal is doing well after SCS placement.     He is improving from surgery.  We will see him back on an as needed basis.  We will release him to return to work on March 18.  I will see him back on an as-needed basis.  Meade Maw MD Department of neurosurgery

## 2022-08-18 ENCOUNTER — Ambulatory Visit: Payer: Medicaid Other | Admitting: Internal Medicine

## 2022-08-18 NOTE — Progress Notes (Deleted)
Office Visit Note  Patient: Cody Villarreal             Date of Birth: 1986-02-06           MRN: [id number]             PCP: Annye English Referring: Loyola Mast, PA-C Visit Date: 08/18/2022   Subjective:  No chief complaint on file.   History of Present Illness: Cody Villarreal is a 37 y.o. male here for follow up ***   Previous HPI 08/12/21 Cody Villarreal is a 37 y.o. male here for follow up for suspected sjogren's syndrome with multiple joint pains also skin rashes and some neuropathy symptoms. Labs at initial visit in December showing very slight sed rate elevation. He is doing overall well he switching to using a gel based eye drops which are helping more for dryness and irritation. PT for neck pain was partially helpful but still having a lot of trouble again. He is planning for MRI to assess this with his history of cervical disc herniation. He noticed some burning discomfort coming and going in the right foot toes. Usually lasing a few minutes at a time and no visible changes.   Previous HPI 05/12/21 Cody Villarreal is a 37 y.o. male here for evaluation of chronic pains and positive ANA with probable sjogrens syndrome. Medical history is significant for well controlled HIV genvoya. He sees pain management with [facility] and has seen [facility]surgery for cervical spine herniation treatment. C3-4 fixation in 2015 with revision in 2017.  He has previously seen rheumatology in 2020 for evaluation of symptoms with positive ANA suspected as Sjogren's syndrome no immunosuppressive treatment started pain management for joint and body pains.  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  He has been having increased pain and bilateral upper extremities and in the knees for the past 6 months.  These particularly been worse after presumed COVID infection in September.  His children at home  were sick with positive test though he never had a personal positive COVID antibody test.  Symptoms took about 2 months to resolve but he is continued having increased joint pains and fatigue.  He has noticed increase in bilateral ankle swelling slightly worse on the left side.  He has skin peeling with dryness and itching on the thighs and ankles of both legs.  He had some skin rash and symptoms involving the hands this improved with topical triamcinolone treatment.  He is on chronic pain medication including Lyrica, Cymbalta, as needed ibuprofen, ongoing treatment with [facility] management at [facility].  More recent lab work-up demonstrated positive ANA with SSB antibodies.     Labs reviewed ANA pos SSB 2.0 SSAB, Scl-70, chromatin, RNP, dsDNA, SM, Jo-1, centromere Abs neg RF neg CCP neg CRP <4   No Rheumatology ROS completed.   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   Chronic pain syndrome 03/10/2022   Onychomycosis 08/12/2021   Dry mouth and eyes 05/12/2021   ANA positive 08/28/2020   Foreign body sensation in throat 05/15/2020   Cigarette smoker 05/07/2020   Gastroesophageal reflux disease 05/07/2020   Nausea and vomiting 11/25/2016   Viral warts 11/25/2016   Pain of toe of left foot 08/09/2016   Acute allergic rhinitis 03/22/2016   S/P cervical spinal fusion 03/22/2016  Cervical spondylosis with radiculopathy 11/24/2015   Cervical disc herniation 09/24/2015   Intractable neuropathic pain of right lower extremity 09/24/2015   Polyneuropathy associated with underlying disease ([place]) 08/13/2015   Pain in both lower extremities 08/06/2015   Nicotine dependence, uncomplicated [id number]   Pain in joint involving lower leg 01/20/2012   Human immunodeficiency virus (HIV) disease ([place]) 10/25/2011    Past Medical History:  Diagnosis Date   ANA positive    Cervical disc herniation    Chronic pain disorder     Cigarette smoker    GERD (gastroesophageal reflux disease)    h/o   HIV disease ([place]) 2013   Neuropathy    Patella-femoral syndrome    Sjogren's disease ([place])     Family History  Problem Relation Age of Onset   Breast cancer Mother    Healthy Father    Past Surgical History:  Procedure Laterality Date   BACK SURGERY     x2 2014/2017   THORACIC LAMINECTOMY FOR SPINAL CORD STIMULATOR N/A 06/22/2022   Procedure: THORACIC LAMINECTOMY FOR SPINAL CORD STIMULATOR PLACEMENT ([employer]);  Surgeon: Meade Maw, MD;  Location: [facility] ORS;  Service: Neurosurgery;  Laterality: N/A;   Social History   Social History Narrative   Not on file   Immunization History  Administered Date(s) Administered   Hep A / Hep B 09/23/2011   Hepatitis A, Adult 09/23/2011, 04/10/2012, 11/26/2013   Hepatitis A, Ped/Adol-2 Dose 09/23/2011, 04/10/2012, 11/26/2013   Hepatitis B, ADULT 09/23/2011   Hepatitis B, PED/ADOLESCENT 09/23/2011   Influenza Nasal 09/23/2011   Influenza Split 09/23/2011, 02/28/2012, 07/09/2013   Influenza, Seasonal, Injecte, Preservative Fre 02/28/2012, 07/09/2013, 02/24/2014, 06/06/2016   Influenza,Quad,Nasal, Live 09/23/2011   Influenza,inj,Quad PF,6+ Mos 02/24/2014, 06/06/2016   Influenza-Unspecified 02/03/2017   PFIZER(Purple Top)SARS-COV-2 Vaccination 01/14/2020, 02/04/2020   PPD Test 09/23/2011   Pneumococcal Conjugate-13 10/16/2012   Pneumococcal Polysaccharide-23 09/23/2011, 10/18/2017, 10/18/2017   Tdap 09/23/2011     Objective: Vital Signs: There were no vitals taken for this visit.   Physical Exam   Musculoskeletal Exam: ***  CDAI Exam: CDAI Score: -- Patient Global: --; Provider Global: -- Swollen: --; Tender: -- Joint Exam 08/18/2022   No joint exam has been documented for this visit   There is currently no information documented on the homunculus. Go to the Rheumatology activity and complete the homunculus joint exam.  Investigation: No additional  findings.  Imaging: No results found.  Recent Labs: Lab Results  Component Value Date   WBC 6.0 06/09/2022   HGB 13.7 06/09/2022   PLT 312 06/09/2022   NA 139 06/09/2022   K 3.9 06/09/2022   CL 103 06/09/2022   CO2 23 06/09/2022   GLUCOSE 111 (H) 06/09/2022   BUN 9 06/09/2022   CREATININE 0.99 06/09/2022   BILITOT 0.5 03/21/2021   ALKPHOS 63 03/21/2021   AST 15 03/21/2021   ALT 10 03/21/2021   PROT 7.2 03/21/2021   ALBUMIN 3.2 (L) 03/21/2021   CALCIUM 9.2 06/09/2022   GFRAA >60 05/25/2017    Speciality Comments: No specialty comments available.  Procedures:  No procedures performed Allergies: Amoxicillin and Prednisone   Assessment / Plan:     Visit Diagnoses: No diagnosis found.  ***  Orders: No orders of the defined types were placed in this encounter.  No orders of the defined types were placed in this encounter.    Follow-Up Instructions: No follow-ups on file.   Collier Salina, MD  Note - This record has been created using  Editor, commissioning.  Chart creation errors have been sought, but may not always  have been located. Such creation errors do not reflect on  the standard of medical care.

## 2022-08-31 ENCOUNTER — Ambulatory Visit: Admit: 2022-08-31 | Discharge: 2022-09-01 | Payer: BLUE CROSS/BLUE SHIELD

## 2022-08-31 DIAGNOSIS — F17210 Nicotine dependence, cigarettes, uncomplicated: Principal | ICD-10-CM

## 2022-08-31 DIAGNOSIS — Z8619 Personal history of other infectious and parasitic diseases: Principal | ICD-10-CM

## 2022-08-31 DIAGNOSIS — Z113 Encounter for screening for infections with a predominantly sexual mode of transmission: Principal | ICD-10-CM

## 2022-08-31 DIAGNOSIS — Z9189 Other specified personal risk factors, not elsewhere classified: Principal | ICD-10-CM

## 2022-08-31 DIAGNOSIS — B20 Human immunodeficiency virus [HIV] disease: Principal | ICD-10-CM

## 2022-08-31 DIAGNOSIS — J309 Allergic rhinitis, unspecified: Principal | ICD-10-CM

## 2022-08-31 DIAGNOSIS — R053 Chronic cough: Principal | ICD-10-CM

## 2022-08-31 DIAGNOSIS — Z5181 Encounter for therapeutic drug level monitoring: Principal | ICD-10-CM

## 2022-08-31 DIAGNOSIS — Z79899 Other long term (current) drug therapy: Principal | ICD-10-CM

## 2022-08-31 MED ORDER — GENVOYA 150 MG-150 MG-200 MG-10 MG TABLET
ORAL_TABLET | Freq: Every day | ORAL | 3 refills | 90 days | Status: CP
Start: 2022-08-31 — End: ?

## 2022-08-31 MED ORDER — BECLOMETHASONE DIPROPIONATE (AQUEOUS) 42 MCG (0.042 %) NASAL SPRAY
Freq: Two times a day (BID) | NASAL | 0 refills | 0.00000 days | Status: CP
Start: 2022-08-31 — End: 2022-08-31

## 2022-08-31 MED ORDER — NICOTINE 7 MG/24 HR DAILY TRANSDERMAL PATCH
MEDICATED_PATCH | TRANSDERMAL | 0 refills | 14.00000 days | Status: CP
Start: 2022-08-31 — End: 2022-08-31

## 2022-09-01 MED ORDER — BECLOMETHASONE DIPROPIONATE 80 MCG/ACTUATION NASAL HFA INHALER
Freq: Every day | NASAL | 0 refills | 0 days | Status: CP
Start: 2022-09-01 — End: ?

## 2022-10-10 DIAGNOSIS — B20 Human immunodeficiency virus [HIV] disease: Principal | ICD-10-CM

## 2022-10-10 MED ORDER — CABOTEGRAVIR ER 600 MG/3 ML-RILPIVIRINE ER 900 MG/3ML IM SUSPENSION,ER
INTRAMUSCULAR | 6 refills | 56.00000 days | Status: CP
Start: 2022-10-10 — End: ?
  Filled 2022-10-19: qty 6, 30d supply, fill #0

## 2022-10-12 NOTE — Unmapped (Signed)
[facility] SSC Specialty Medication Onboarding    Specialty Medication: CABENUVA 600 mg/3 mL- 900 mg/3 mL extended-release injection (cabotegravir-rilpivirine)  Prior Authorization: Not Required   Financial Assistance: No - copay  <$25  Final Copay/Day Supply: $0 / 30    Insurance Restrictions: Yes - max 1 month supply     Notes to Pharmacist:   Credit Card on File: not applicable    The triage team has completed the benefits investigation and has determined that the patient is able to fill this medication at [facility]. Please contact the patient to complete the onboarding or follow up with the prescribing physician as needed.

## 2022-10-12 NOTE — Unmapped (Signed)
This patient is receiving Cabenuva as a clinic administered medication. Pharmacist reviewed the prescription and the patient's chart and determined that therapy is appropriate.  Patient is: not contacted as copay is $0.00   All future communication to occur between the SSC and the clinic. This patient is being disenrolled from our specialty management program and will be added to a patient list for appropriate follow up.      Zachary Jennings M. Leeasia Secrist, Pharm.D.  Specialty Pharmacist  [facility]  [phone] option 4,  then option 4 again

## 2022-10-13 NOTE — Unmapped (Signed)
Spoke with Zachary Jennings about starting Cabenuva injections ,his dose will be delivered to the clinic on the 23rd. Stated he would be able to do his first visit on may 30th. Appt made.

## 2022-10-25 DIAGNOSIS — B20 Human immunodeficiency virus [HIV] disease: Principal | ICD-10-CM

## 2022-10-27 ENCOUNTER — Ambulatory Visit: Admit: 2022-10-27 | Payer: BLUE CROSS/BLUE SHIELD

## 2022-11-04 NOTE — Unmapped (Signed)
Patient requested benefits to send in [employer] application to their case manager @ [email or web address]     Benefits sent requested information in an encrypted email.        Mickle Asper,  Benefits & Eligibility Coordinator  Time of Intervention: 5 minutes

## 2022-11-09 ENCOUNTER — Telehealth: Payer: Self-pay

## 2022-11-09 NOTE — Telephone Encounter (Addendum)
I notified Wylene Men, and Kristen with [employer] in case they feel they need to come to the appt. Baxter Hire said they will check to see if he needs/wants them there.

## 2022-11-09 NOTE — Telephone Encounter (Signed)
-----   Message from Rockey Situ sent at 11/09/2022  9:07 AM EDT ----- Regarding: SCS Scheduled him for next Wednesday with Stacy since Dr.Y is booked out. He is experiencing back pain. Same symptoms has before SCS. He has made adjustments with the [employer] rep but it's still not helping. Will a rep need to be here for this appt?

## 2022-11-10 NOTE — Unmapped (Signed)
Zachary Jennings has been contacted in regards to their refill of Cabenuva. At this time, they have declined refill due to  per B[facility] @ clinic pt has yet to start will follow-up in 2 months  . Refill assessment call date has been updated per the patient's request.

## 2022-11-15 ENCOUNTER — Encounter: Payer: Self-pay | Admitting: Orthopedic Surgery

## 2022-11-15 DIAGNOSIS — Z9689 Presence of other specified functional implants: Secondary | ICD-10-CM

## 2022-11-15 DIAGNOSIS — G894 Chronic pain syndrome: Secondary | ICD-10-CM

## 2022-11-15 NOTE — Progress Notes (Deleted)
Referring Physician:  No referring provider defined for this encounter.  Primary Physician:  Bryon Lions, PA-C  History of Present Illness: 11/15/2022*** [name] has a history of HIV, neuropathy, sjogren's disease.   He had spinal fusion in 2015 and 2017 with Dr. Wynelle Link. He then had SCS by Dr. Myer Haff on 06/22/22. Last seen by him on 08/04/22 and he was dong well.        Duration: *** Location: *** Quality: *** Severity: ***  Precipitating: aggravated by *** Modifying factors: made better by *** Weakness: none Timing: *** Bowel/Bladder Dysfunction: none  Conservative measures:  Physical therapy: ***  Multimodal medical therapy including regular antiinflammatories: ***  Injections: *** epidural steroid injections  Past Surgery:  SCS by Dr. Myer Haff 06/22/22 Lumbar fusion in 2015 and 2017 by Dr. Bo Mcclintock [name] has ***no symptoms of cervical myelopathy.  The symptoms are causing a significant impact on the patient's life.   Review of Systems:  A 10 point review of systems is negative, except for the pertinent positives and negatives detailed in the HPI.  Past Medical History: Past Medical History:  Diagnosis Date   ANA positive    Cervical disc herniation    Chronic pain disorder    Cigarette smoker    GERD (gastroesophageal reflux disease)    h/o   HIV disease ([facility]) 2013   Neuropathy    Patella-femoral syndrome    Sjogren's disease ([facility])     Past Surgical History: Past Surgical History:  Procedure Laterality Date   BACK SURGERY     x2 2014/2017   THORACIC LAMINECTOMY FOR SPINAL CORD STIMULATOR N/A 06/22/2022   Procedure: THORACIC LAMINECTOMY FOR SPINAL CORD STIMULATOR PLACEMENT ([employer]);  Surgeon: Venetia Night, MD;  Location: [facility] ORS;  Service: Neurosurgery;  Laterality: N/A;    Allergies: Allergies as of 11/16/2022 - Review Complete 08/04/2022  Allergen Reaction Noted   Amoxicillin Hives 06/28/2018   Prednisone  Nausea And Vomiting 04/30/2021    Medications: Outpatient Encounter Medications as of 11/16/2022  Medication Sig   DULoxetine (CYMBALTA) 30 MG capsule Take 60 mg by mouth 3 (three) times daily.   fluticasone (FLONASE) 50 MCG/ACT nasal spray Place 1 spray into both nostrils as needed for allergies or rhinitis.   GENVOYA 150-150-200-10 MG TABS tablet Take 1 tablet by mouth daily with breakfast.   methocarbamol (ROBAXIN) 500 MG tablet Take 1 tablet (500 mg total) by mouth 4 (four) times daily.   [DISCONTINUED] gabapentin (NEURONTIN) 300 MG capsule Take 1 capsule (300 mg total) by mouth 3 (three) times daily. Take 300mg  at night on day 1, 300mg  twice a day on day 2, 300mg  three times on day 3 and every day thereafter until you see your primary doctor (Patient not taking: Reported on 09/24/2015)   [DISCONTINUED] ipratropium (ATROVENT) 0.06 % nasal spray Place 2 sprays into both nostrils 4 (four) times daily.   No facility-administered encounter medications on file as of 11/16/2022.    Social History: Social History   Tobacco Use   Smoking status: Every Day    Packs/day: 0.50    Years: 18.00    Additional pack years: 0.00    Total pack years: 9.00    Types: Cigarettes   Smokeless tobacco: Never  Vaping Use   Vaping Use: Some days   Substances: Nicotine, Flavoring  Substance Use Topics   Alcohol use: No   Drug use: No    Family Medical History: Family History  Problem Relation Age of  Onset   Breast cancer Mother    Healthy Father     Physical Examination: There were no vitals filed for this visit.  General: Patient is well developed, well nourished, calm, collected, and in no apparent distress. Attention to examination is appropriate.  Respiratory: Patient is breathing without any difficulty.   NEUROLOGICAL:     Awake, alert, oriented to person, place, and time.  Speech is clear and fluent. Fund of knowledge is appropriate.   Cranial Nerves: Pupils equal round and reactive  to light.  Facial tone is symmetric.    *** ROM of cervical spine *** pain *** posterior cervical tenderness. *** tenderness in bilateral trapezial region.   *** ROM of lumbar spine *** pain *** posterior lumbar tenderness.   No abnormal lesions on exposed skin.   Strength: Side Biceps Triceps Deltoid Interossei Grip Wrist Ext. Wrist Flex.  R 5 5 5 5 5 5 5   L 5 5 5 5 5 5 5    Side Iliopsoas Quads Hamstring PF DF EHL  R 5 5 5 5 5 5   L 5 5 5 5 5 5    Reflexes are ***2+ and symmetric at the biceps, triceps, brachioradialis, patella and achilles.   Hoffman's is absent.  Clonus is not present.   Bilateral upper and lower extremity sensation is intact to light touch.     Gait is normal.   ***No difficulty with tandem gait.    Medical Decision Making  Imaging: No recent lumbar imaging.   I have personally reviewed the images and agree with the above interpretation.  Assessment and Plan: [name] is a pleasant 37 y.o. male has ***  Treatment options discussed with patient and following plan made:   - Order for physical therapy for *** spine ***. Patient to call to schedule appointment. *** - Continue current medications including ***. Reviewed dosing and side effects.  - Prescription for ***. Reviewed dosing and side effects. Take with food.  - Prescription for *** to take prn muscle spasms. Reviewed dosing and side effects. Discussed this can cause drowsiness.  - MRI of *** to further evaluate *** radiculopathy. No improvement time or medications (***).  - Referral to PMR at [facility] to discuss possible *** injections.  - Will schedule phone visit to review MRI results once I get them back.   I spent a total of *** minutes in face-to-face and non-face-to-face activities related to this patient's care today including review of outside records, review of imaging, review of symptoms, physical exam, discussion of differential diagnosis, discussion of treatment options, and  documentation.   Thank you for involving me in the care of this patient.   Drake Leach PA-C Dept. of Neurosurgery

## 2022-11-16 ENCOUNTER — Ambulatory Visit: Payer: Medicaid Other | Admitting: Orthopedic Surgery

## 2022-11-29 NOTE — Unmapped (Signed)
Called to ask Zachary Jennings if he was still interested in starting Guinea. No answer - left voicemail with call back number.

## 2022-12-20 ENCOUNTER — Ambulatory Visit: Payer: Medicaid Other | Admitting: Neurosurgery

## 2022-12-25 DIAGNOSIS — B20 Human immunodeficiency virus [HIV] disease: Principal | ICD-10-CM

## 2023-01-05 NOTE — Unmapped (Signed)
Zachary Jennings has been contacted in regards to their refill of cabenuva . At this time, they have declined refill due to  [name] states pt has not yet started will follow-up in 2 months  . Refill assessment call date has been updated per the patient's request.

## 2023-02-19 DIAGNOSIS — B20 Human immunodeficiency virus [HIV] disease: Principal | ICD-10-CM

## 2023-02-28 DIAGNOSIS — B20 Human immunodeficiency virus [HIV] disease: Principal | ICD-10-CM

## 2023-03-01 NOTE — Unmapped (Signed)
Spoke with Zachary Jennings about his decision on starting [place]. He would like to start his injections with his upcoming provider visit on 10/9. Explained he would need to return again in November for his second booster dose before he can apply to have his injections done at [facility] in [place].  Stated understanding.   Appt made.

## 2023-03-03 NOTE — Unmapped (Signed)
[facility] Administered Medication Refill Coordination Note      NAME:Zachary Jennings DOB: [birth year]      Medication: Zachary Jennings    Day Supply:  30 days      SHIPPING      Next delivery from [facility] [phone]) to  [facility]  [name] is scheduled for 10/10    Clinic contact: [name]    Patient's next nurse visit for administration: 11/16.    We will follow up with clinic monthly for standard refill processing and delivery.      Justus Duerr Samella Parr  [facility] Specialty and [facility]

## 2023-03-08 ENCOUNTER — Ambulatory Visit: Admit: 2023-03-08 | Discharge: 2023-03-08 | Payer: BLUE CROSS/BLUE SHIELD

## 2023-03-08 ENCOUNTER — Institutional Professional Consult (permissible substitution): Admit: 2023-03-08 | Discharge: 2023-03-08 | Payer: BLUE CROSS/BLUE SHIELD

## 2023-03-08 DIAGNOSIS — Z9189 Other specified personal risk factors, not elsewhere classified: Principal | ICD-10-CM

## 2023-03-08 DIAGNOSIS — B20 Human immunodeficiency virus [HIV] disease: Principal | ICD-10-CM

## 2023-03-08 DIAGNOSIS — Z5181 Encounter for therapeutic drug level monitoring: Principal | ICD-10-CM

## 2023-03-08 DIAGNOSIS — Z113 Encounter for screening for infections with a predominantly sexual mode of transmission: Principal | ICD-10-CM

## 2023-03-08 DIAGNOSIS — Z8619 Personal history of other infectious and parasitic diseases: Principal | ICD-10-CM

## 2023-03-08 DIAGNOSIS — Z79899 Other long term (current) drug therapy: Principal | ICD-10-CM

## 2023-03-08 DIAGNOSIS — F17210 Nicotine dependence, cigarettes, uncomplicated: Principal | ICD-10-CM

## 2023-03-08 LAB — CBC W/ AUTO DIFF
BASOPHILS ABSOLUTE COUNT: 0 10*9/L (ref 0.0–0.1)
BASOPHILS RELATIVE PERCENT: 0.7 %
EOSINOPHILS ABSOLUTE COUNT: 0.2 10*9/L (ref 0.0–0.5)
EOSINOPHILS RELATIVE PERCENT: 4.1 %
HEMATOCRIT: 38.6 % — ABNORMAL LOW (ref 39.0–48.0)
HEMOGLOBIN: 12.7 g/dL — ABNORMAL LOW (ref 12.9–16.5)
LYMPHOCYTES ABSOLUTE COUNT: 1.8 10*9/L (ref 1.1–3.6)
LYMPHOCYTES RELATIVE PERCENT: 30.2 %
MEAN CORPUSCULAR HEMOGLOBIN CONC: 32.9 g/dL (ref 32.0–36.0)
MEAN CORPUSCULAR HEMOGLOBIN: 27.3 pg (ref 25.9–32.4)
MEAN CORPUSCULAR VOLUME: 82.8 fL (ref 77.6–95.7)
MEAN PLATELET VOLUME: 6.8 fL (ref 6.8–10.7)
MONOCYTES ABSOLUTE COUNT: 0.7 10*9/L (ref 0.3–0.8)
MONOCYTES RELATIVE PERCENT: 11.6 %
NEUTROPHILS ABSOLUTE COUNT: 3.2 10*9/L (ref 1.8–7.8)
NEUTROPHILS RELATIVE PERCENT: 53.4 %
PLATELET COUNT: 252 10*9/L (ref 150–450)
RED BLOOD CELL COUNT: 4.67 10*12/L (ref 4.26–5.60)
RED CELL DISTRIBUTION WIDTH: 14 % (ref 12.2–15.2)
WBC ADJUSTED: 6 10*9/L (ref 3.6–11.2)

## 2023-03-08 LAB — BASIC METABOLIC PANEL
ANION GAP: 4 mmol/L — ABNORMAL LOW (ref 5–14)
BLOOD UREA NITROGEN: 9 mg/dL (ref 9–23)
BUN / CREAT RATIO: 7
CALCIUM: 9.7 mg/dL (ref 8.7–10.4)
CHLORIDE: 106 mmol/L (ref 98–107)
CO2: 28.9 mmol/L (ref 20.0–31.0)
CREATININE: 1.21 mg/dL — ABNORMAL HIGH
EGFR CKD-EPI (2021) MALE: 80 mL/min/{1.73_m2} (ref >=60)
GLUCOSE RANDOM: 99 mg/dL (ref 70–179)
POTASSIUM: 4.1 mmol/L (ref 3.4–4.8)
SODIUM: 139 mmol/L (ref 135–145)

## 2023-03-08 LAB — BILIRUBIN, TOTAL: BILIRUBIN TOTAL: 0.2 mg/dL — ABNORMAL LOW (ref 0.3–1.2)

## 2023-03-08 LAB — AST: AST (SGOT): 12 U/L (ref <=34)

## 2023-03-08 LAB — ALT: ALT (SGPT): <7 U/L — ABNORMAL LOW (ref 10–49)

## 2023-03-08 MED ADMIN — cabotegravir-rilpivirine (CABENUVA) 600 mg-900 mg/3 mL extended-release injection 6 mL: 6 mL | INTRAMUSCULAR | @ 19:00:00 | Stop: 2023-03-08

## 2023-03-08 MED FILL — CABENUVA 600 MG/3 ML-900 MG/3 ML IM SUSPENSION, EXTENDED RELEASE: INTRAMUSCULAR | 30 days supply | Qty: 6 | Fill #1

## 2023-03-08 NOTE — Unmapped (Signed)
Patient here for first Cabenuva injections. Reviewed patient education sheet. Stated understanding of +/- 7 day rule for scheduling future appointments. PHQ-9 completed. PHQ-9 score: 1. Labs obtained. Tolerated well. Needle size: 23G 1 1/2 Next appointment made for 11/06.

## 2023-03-08 NOTE — Unmapped (Signed)
Assessment/Plan    Zachary Jennings is a 37 y.o. male who presents to the Infectious Disease clinic for:    HIV  VL undetectable for many years.  Discussed transitioning to LAI at last visit, which he would like to do. Therefore, will STOP Genvoya, START cabotegravir/rilpivirine today. Pt to return in 1 month for 2nd dose. Plans to continue to receive doses at [facility] q66months but knows about [facility] as a closer option to him if needed.  Brief return labs today.    Chronic cough, nasal congestion: Much improved. PCP prescribed fluticasone for allergic rhinitis, switched to beclomethasone due to interaction of fluticasone with Genvoya. Will continue as this is helping him, but could switch back prn once stable on CAB.   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    Tobacco Use: had been trying to quit, but increased stress due to teenage daughter's behavior. Never started nicotine patches. Contemplative, not interested in quitting today. Smoking 1/2ppd currently  Continue to address at each visit.    Sexual health  H/o syphilis Nov 2022 - s/p treatment at health dept with appropriate derease (1:32 --> 1:1)  Repeat today.    Sjogrens: Diagnosed by Rheum 05/2018. Minimal symptoms.    Health maintenance  Immunizations reviewed - UTD except COVID and flu, declined today. Will give at [facility] visit next month  Anal pap - negative 09/2020, to be done at next visit  Hepatitis: Hep C Ab neg 09/2018, Hep B immune 12/2014  TB: PPD neg 2013  CV Health: A1c normal 09/2020  Dental: referral placed at last visit to [facility], not yet scheduled    Immunization History   Administered Date(s) Administered    COVID-19 VACC,MRNA,(PFIZER)(PF) 01/14/2020, 02/04/2020    HEPATITIS B VACCINE ADULT,IM(ENERGIX B, RECOMBIVAX) 09/23/2011    Hep A / Hep B 09/23/2011    Hepatitis A (Adult) 09/23/2011, 04/10/2012, 11/26/2013    Hepatitis A Vaccine Pediatric / Adolescent 2 Dose IM 11/26/2013    Hepatitis B vaccine, pediatric/adolescent dosage, 09/23/2011    INFLUENZA TIV (TRI) 46MO+ W/ PRESERV (IM) 09/23/2011, 02/28/2012, 07/09/2013    INFLUENZA TIV (TRI) PF (IM) 02/28/2012, 07/09/2013, 02/24/2014, 06/06/2016    Influenza LAIV (Nasal-Tri) HISTORICAL 09/23/2011    Influenza Vaccine Nasal-Quad (2-59yrs)(Flumist) 09/23/2011    Influenza Vaccine Quad(IM)6 MO-Adult(PF) 02/24/2014, 06/06/2016    Influenza Virus Vaccine, unspecified formulation 02/03/2017    PNEUMOCOCCAL POLYSACCHARIDE 23-VALENT 09/23/2011, 10/18/2017    PPD Test 09/23/2011    Pneumococcal Conjugate 13-Valent 10/16/2012    TdaP 09/23/2011       Disposition  Return to clinic  in 1 month  for next LAI visit, 5 months with me    I personally spent 30 minutes face-to-face and non-face-to-face in the care of this patient, which includes all pre, intra, and post visit time on the date of service.  All documented time was specific to the E/M visit and does not include any procedures that may have been performed.        Future Appointments   Date Time Provider Department Center   04/05/2023  3:00 PM [facility] NURSE Jac[facility] Prime   08/09/2023  3:30 PM Phillipe Clemon, Marda Stalker, MD [facility]  Subjective      Chief Complaint   Follow-up HIV    HPI  Zachary Jennings is a 37 y.o. male with HIV who presents for a routine HIV follow-up. he is currently on Genvoya for therapy and is tolerating well. Reports no missed doses in the past month. Most recent VL ND, CD4 1,058/46% 08/31/22.    Last seen by me in clinic 09/03/22. Overall, doing well since then. Continues to have relief of back pain from SCS as long as battery is charged. No urgent care or ED visits. Interested in starting LAI today. No new sexual partners. Denies dysuria, penile discharge, SOB, cough.    Stress related to teenage daughter's behavior. Because of this, continuing to smoke 1/2ppd. Will think more about quitting once things have improved with her.      Past Medical History  Reviewed and updated today - unchanged    Medications  Current Outpatient Medications   Medication Sig Dispense Refill    beclomethasone dipropionate (QNASL) 80 mcg/actuation nasal inhaler 2 sprays into each nostril daily. 10.6 g 0    cabotegravir-rilpivirine (CABENUVA) 600 mg-900 mg/3 mL extended-release injection Inject 6 mL into the muscle every thirty (30) days. 6 mL 1    elvitegravir-cobicistat-emtricitabine-tenofovir (GENVOYA) 150-150-200-10 mg Tab tablet Take 1 tablet by mouth daily. 90 tablet 3    cabotegravir-rilpivirine (CABENUVA) 600 mg-900 mg/3 mL extended-release injection Inject 6 mL into the muscle every 8 weeks. (Patient not taking: Reported on 03/08/2023) 6 mL 6     No current facility-administered medications for this visit.     Facility-Administered Medications Ordered in Other Visits   Medication Dose Route Frequency Provider Last Rate Last Admin    dexAMETHasone (DECADRON) 4 mg/mL injection 20 mg  20 mg Intravenous Once PRN Lafi, Yousef N, CPP        diphenhydrAMINE (BENADRYL) injection 25 mg  25 mg Intravenous Once PRN Lafi, Yousef N, CPP        EPINEPHrine (EPIPEN) injection 0.3 mg  0.3 mg Intramuscular Once PRN Lafi, Yousef N, CPP        famotidine (PF) (PEPCID) injection 20 mg  20 mg Intravenous Once PRN Lafi, Yousef N, CPP        methylPREDNISolone sodium succinate (PF) (SOLU-Medrol) injection 125 mg  125 mg Intravenous Once PRN Lafi, Yousef N, CPP        sodium chloride (NS) 0.9 % infusion  20 mL/hr Intravenous Continuous PRN Lafi, Yousef N, CPP        sodium chloride 0.9% (NS) bolus 1,000 mL  1,000 mL Intravenous Once PRN Lafi, Yousef N, CPP             Social History  General - Living with 3 children and friend in [place]. Working at [employer] - hoping to advance in the company    Sexual History - previous male partner (his ex-wife), currently sexually active with one male partner. Has had male partners in the past, vers, oral sex as well    Substance Use - smokes <0.5PPD - working on quitting as above, no ETOH,    Family History   Reviewed    Review of Systems  As per HPI. Remainder of 10 systems reviewed, negative.    Allergies and Medications  Reviewed and updated with patient today. See bottom of this visit's encounter summary for details.        Objective      BP 105/61 (BP Site: L Arm, BP Position: Sitting, BP Cuff Size:  Large)  - Pulse 83  - Temp 37.1 ??C (98.8 ??F) (Oral)  - Ht 175.3 cm (5' 9.02)  - Wt 61.2 kg (135 lb)  - BMI 19.93 kg/m??      Wt Readings from Last 6 Encounters:   03/08/23 61.2 kg (135 lb)   08/31/22 63 kg (139 lb)   09/22/21 68.9 kg (151 lb 12.8 oz)   10/07/20 65.3 kg (144 lb)   04/22/20 (P) 72.6 kg (160 lb)   11/01/19 68.6 kg (151 lb 3.2 oz)     Const looks well or attentive, alert, appropriate.   Eyes sclerae anicteric, noninjected OU, EOMI   ENT OP clear without erythema or ulceration.    CV RRR. No murmurs. No rub or gallop. S1/S2. Trace bilateral ankle edeam   Resp CTAB ant/post, normal work of breathing. No wheezes   GI Flat, soft. NTND. NABS.   GU deferred   Rectal deferred   Skin NO rashes or lesions on limited exam   Extremities No cyanosis, clubbing. No joint effusions, FROM without tenderness.   MSK No joint swelling   Neuro CN II-XII grossly intact, MAEE, non focal   Psych Appropriate affect. Eye contact good. Linear thoughts. Fluent speech.       Laboratory Data  Reviewed in Epic today, using Synopsis and Chart Review filters.  HIV RNA Quant Result   Date Value Ref Range Status   08/31/2022 Not Detected Not Detected Final   09/22/2021 Not Detected Not Detected Final   10/07/2020 Not Detected Not Detected Final   04/22/2020 Not Detected Not Detected Final   02/24/2014 Detected  Final   11/26/2013 Not Detected Final   07/09/2013 Not Detected  Final   01/15/2013 Detected  Final     HIV RNA   Date Value Ref Range Status   02/24/2014 <40 /mL Final   01/15/2013 <40 /mL Final   10/16/2012 49 /mL Final   07/17/2012 < 40 COPIES/ML Final     Absolute CD4 Count   Date Value Ref Range Status   08/31/2022 1,058 510 - 2,320 /uL Final   09/22/2021 1,161 510 - 2,320 /uL Final   10/07/2020 1,040 510-2,320 /uL Final   11/01/2019 990 510-2,320 /uL Final   11/26/2013 970 510 - 2,320 /uL Final   07/09/2013 992 510 - 2,320 /uL Final   01/15/2013 855 510 - 2,320 /uL Final   10/16/2012 977 510 - 2,320 /uL Final       Lab Results   Component Value Date    WBC 6.0 03/08/2023    WBC 4.8 02/24/2014    HGB 12.7 (L) 03/08/2023    HGB 13.8 02/24/2014    HCT 38.6 (L) 03/08/2023    HCT 43.9 02/24/2014    Platelet 252 03/08/2023    Platelet 251 02/24/2014     Lab Results   Component Value Date    Creatinine 1.16 08/31/2022    Creatinine 1.14 02/24/2014     Lab Results   Component Value Date    AST 14 08/31/2022    AST 19 02/24/2014    ALT <7 (L) 08/31/2022    ALT 19 02/24/2014     Lab Results   Component Value Date    Glucose 87 08/31/2022    Hemoglobin A1C 4.9 10/07/2020     Lab Results   Component Value Date    HDL 38 (L) 10/25/2011    Non HDL Chol.  75 10/25/2011       Sexual Health Data  Gonorrhoeae NAA   Date Value Ref Range Status   08/31/2022 Negative Negative Final   08/31/2022 Negative Negative Final   08/31/2022 Negative Negative Final   11/26/2013 NEGATIVE  Final   11/26/2013 NEGATIVE  Final   10/16/2012 NEGATIVE  Final   10/16/2012 NEGATIVE  Final     GC Source   Date Value Ref Range Status   11/26/2013 THROAT  Final   11/26/2013 RANDOM URINE  Final   10/16/2012 THROAT  Final   10/16/2012 RANDOM URINE  Final     Chlamydia trachomatis, NAA   Date Value Ref Range Status   08/31/2022 Negative Negative Final   08/31/2022 Negative Negative Final   08/31/2022 Negative Negative Final   11/26/2013 NEGATIVE  Final     Comment:     Test performed using the Hologic APTIMA Combo 2 Assay, an  FDA-approved target amplification nucleic acid probe test  for the qualitative detection of Chlamydia trachomatis and  Neisseria gonorrhoeae. The assay is not FDA-approved for  rectal, throat and male urine specimens, however, the  performance characteristics have been validated by the [facility] Clinical Molecular Microbiology Laboratory.     11/26/2013 NEGATIVE  Final     Comment:     Test performed using the Hologic APTIMA Combo 2 Assay, an  FDA-approved target amplification nucleic acid probe test  for the qualitative detection of Chlamydia trachomatis and  Neisseria gonorrhoeae. The assay is not FDA-approved for  rectal, throat and male urine specimens, however, the  performance characteristics have been validated by the [facility] Clinical Molecular Microbiology Laboratory.     10/16/2012 NEGATIVE  Final   10/16/2012 NEGATIVE  Final     Hepatitis C Ab   Date Value Ref Range Status   10/07/2020 Nonreactive Nonreactive Final     Comment:     Antibodies to HCV were not detected.  A nonreactive result does not exclude the possibility of exposure to HCV.   10/19/2018 Nonreactive Nonreactive Final     Comment:     Antibodies to HCV were not detected.  A nonreactive result does not exclude the possibility of exposure to HCV.   06/06/2016 Nonreactive Nonreactive Final     Comment:       Antibodies to HCV were not detected.  A nonreactive result does not exclude the possibility of exposure to HCV.   10/25/2011 Negative  Final     Comment:     Antibodies to HCV were NOT detected.  A nonreactive result  does not exclude the possibility of exposure to HCV.

## 2023-03-08 NOTE — Unmapped (Signed)
Good to see you today. We will see you again in a month for your next cabenuva injection and for vaccinations.

## 2023-03-09 LAB — HIV RNA, QUANTITATIVE, PCR: HIV RNA QNT RSLT: NOT DETECTED

## 2023-03-09 LAB — RPR, THERAPY MONITORING: SYPHILIS RPR SCREEN: NONREACTIVE

## 2023-03-09 NOTE — Unmapped (Signed)
Spoke with Zachary Jennings re: positive gonorrhea testing from pharynx. He prefers to go to the health dept locally for ceftriaxone treatment. Discussed partner testing and treatment, abstaining from sexual activity for at least 7 days post treatment, and TOC via culture at next visit.    Other lab results discussed as well, all unremarkable.

## 2023-03-16 NOTE — Unmapped (Addendum)
Called and spoke with Zachary Jennings- pharmacist Let her know the Zachary Jennings was being taken care of at [facility] due to his Medicaid plan.         ----- Message from Karalee Height sent at 03/16/2023 11:52 AM EDT -----  The PAC has received an incoming clinical call:    Caller name: [facility]   If not the patient, relationship to the patient.  Best callback number: [phone]  Describe the reason for the call: Needs to know if patient is getting cabaneuva in house or from a different pharmacy please call back  Was an appointment offered as a placeholder?      Thanks!

## 2023-04-05 ENCOUNTER — Institutional Professional Consult (permissible substitution): Admit: 2023-04-05 | Discharge: 2023-04-06 | Payer: BLUE CROSS/BLUE SHIELD

## 2023-04-05 DIAGNOSIS — B20 Human immunodeficiency virus [HIV] disease: Principal | ICD-10-CM

## 2023-04-05 DIAGNOSIS — A549 Gonococcal infection, unspecified: Principal | ICD-10-CM

## 2023-04-05 LAB — SLIDE REVIEW

## 2023-04-05 LAB — CBC W/ AUTO DIFF
BASOPHILS ABSOLUTE COUNT: 0.1 10*9/L (ref 0.0–0.1)
BASOPHILS RELATIVE PERCENT: 0.7 %
EOSINOPHILS ABSOLUTE COUNT: 0.4 10*9/L (ref 0.0–0.5)
EOSINOPHILS RELATIVE PERCENT: 5.1 %
HEMATOCRIT: 41.6 % (ref 39.0–48.0)
HEMOGLOBIN: 13.5 g/dL (ref 12.9–16.5)
LYMPHOCYTES ABSOLUTE COUNT: 3 10*9/L (ref 1.1–3.6)
LYMPHOCYTES RELATIVE PERCENT: 39 %
MEAN CORPUSCULAR HEMOGLOBIN CONC: 32.4 g/dL (ref 32.0–36.0)
MEAN CORPUSCULAR HEMOGLOBIN: 27.1 pg (ref 25.9–32.4)
MEAN CORPUSCULAR VOLUME: 83.9 fL (ref 77.6–95.7)
MEAN PLATELET VOLUME: 8.5 fL (ref 6.8–10.7)
MONOCYTES ABSOLUTE COUNT: 0.7 10*9/L (ref 0.3–0.8)
MONOCYTES RELATIVE PERCENT: 9.2 %
NEUTROPHILS ABSOLUTE COUNT: 3.5 10*9/L (ref 1.8–7.8)
NEUTROPHILS RELATIVE PERCENT: 46 %
PLATELET COUNT: 203 10*9/L (ref 150–450)
RED BLOOD CELL COUNT: 4.96 10*12/L (ref 4.26–5.60)
RED CELL DISTRIBUTION WIDTH: 14.3 % (ref 12.2–15.2)
WBC ADJUSTED: 7.7 10*9/L (ref 3.6–11.2)

## 2023-04-05 MED ADMIN — cefTRIAXone (ROCEPHIN) 500 mg, lidocaine (PF) (XYLOCAINE-MPF) 10 mg/mL (1 %) 1.4286 mL injection: 500 mg | INTRAMUSCULAR | @ 21:00:00 | Stop: 2023-04-05

## 2023-04-05 MED ADMIN — cabotegravir-rilpivirine (CABENUVA) 600 mg-900 mg/3 mL extended-release injection 6 mL: 6 mL | INTRAMUSCULAR | @ 21:00:00 | Stop: 2023-04-05

## 2023-04-05 NOTE — Unmapped (Signed)
Patient here for Cabenuva injections. Denies changes in mood or sleep. Tolerated well. Needle size: 23G 1 1/2 Next appointment made for 06/01/2023    Patient came into clinic for treatment of pharyngeal gonorrhea. RN administered Ceftriaxone 500 mg in the left deltoid.         The patient tolerated the injection without an adverse reaction. RN explained no sex for 10 days and partner notification and treatment was discussed. Common side effects of nausea, vomiting, and diarrhea were reviewed. Patient denies additional questions or concerns. CD report completed and sent to [facility]. Condoms were offered.      Patient informed of need to return for TOC within next 3-4 weeks.  He will check his schedule and reach out to nurse about scheduling.    Also provided written information about doxypep and he will research and reach out if he decides to do this.   Zachary Jennings

## 2023-04-06 LAB — LYMPH MARKER LIMITED,FLOW
ABSOLUTE CD3 CNT: 2100 {cells}/uL (ref 915–3400)
ABSOLUTE CD4 CNT: 1230 {cells}/uL (ref 510–2320)
ABSOLUTE CD8 CNT: 840 {cells}/uL (ref 180–1520)
CD3% (T CELLS): 70 % (ref 61–86)
CD4% (T HELPER): 41 % (ref 34–58)
CD4:CD8 RATIO: 1.5 (ref 0.9–4.8)
CD8% T SUPPRESR: 28 % (ref 12–38)

## 2023-04-06 LAB — HIV RNA, QUANTITATIVE, PCR: HIV RNA QNT RSLT: NOT DETECTED

## 2023-04-27 NOTE — Unmapped (Signed)
[facility] Clinic Administered Medication Refill Coordination Note      NAME:Ottis Krass DOB: Mar 09, 1986      Medication: Renaldo Harrison    Day Supply: 56 days      SHIPPING      Next delivery from [facility] [phone]) to [facility] at [facility] for Lavone Lauture is scheduled for 12/12.    Clinic contact: [name]    Patient's next nurse visit for administration: 01/02.    We will follow up with clinic monthly for standard refill processing and delivery.      Parvin Stetzer Samella Parr  [facility] and [facility]

## 2023-05-07 DIAGNOSIS — B20 Human immunodeficiency virus [HIV] disease: Principal | ICD-10-CM

## 2023-05-10 MED FILL — CABENUVA 600 MG/3 ML-900 MG/3 ML IM SUSPENSION, EXTENDED RELEASE: INTRAMUSCULAR | 34 days supply | Qty: 6 | Fill #0

## 2023-06-01 ENCOUNTER — Ambulatory Visit: Admit: 2023-06-01 | Discharge: 2023-06-02 | Payer: BLUE CROSS/BLUE SHIELD

## 2023-06-01 DIAGNOSIS — B20 Human immunodeficiency virus [HIV] disease: Principal | ICD-10-CM

## 2023-06-01 MED ADMIN — cabotegravir-rilpivirine (CABENUVA) 600 mg-900 mg/3 mL extended-release injection 6 mL: 6 mL | INTRAMUSCULAR | @ 16:00:00 | Stop: 2023-06-01

## 2023-06-01 NOTE — Unmapped (Signed)
Called Zachary Jennings to ask about transferring to [facility].   Stated currently living in [place] so would be ok with staying with ID clinic for the forseeable future.   Will let me know when he would like to transfer.

## 2023-06-01 NOTE — Unmapped (Signed)
Patient here for Cabenuva injections. Denies changes in mood or sleep. Tolerated well. Needle size: 23G 1 1/2 Next appointment made for 03/05.

## 2023-06-29 NOTE — Unmapped (Signed)
Zachary Jennings has been contacted in regards to their refill of Cabenuva. At this time, they have declined refill due to  [name] states pt not due til 03/05 will follow-up in 3 weeks to reschedule delivery  . Refill assessment call date has been updated per the patient's request.

## 2023-07-02 DIAGNOSIS — B20 Human immunodeficiency virus [HIV] disease: Principal | ICD-10-CM

## 2023-07-27 NOTE — Unmapped (Signed)
 [facility] Delivery Pharm[facility] Clinic Administered Medication Refill Coordination Note      NAME:Levorn Geffre DOB: [birth year]      Medication: Renaldo Harrison    Day Supply: 56 days      SHIPPING      Next delivery from [facility] [phone]) to [facility] Disease Clinic at [facility] for Oley Lahaie is scheduled for 02/28.    Clinic contact: [name]    Patient's next nurse visit for administration: 03/05.    We will follow up with clinic monthly for standard refill processing and delivery.      Chrisa Hassan Samella Parr  [facility] and [facility]

## 2023-07-28 MED FILL — CABENUVA 600 MG/3 ML-900 MG/3 ML IM SUSPENSION, EXTENDED RELEASE: INTRAMUSCULAR | 34 days supply | Qty: 6 | Fill #1

## 2023-07-30 DIAGNOSIS — B20 Human immunodeficiency virus [HIV] disease: Principal | ICD-10-CM

## 2023-08-02 ENCOUNTER — Ambulatory Visit: Admit: 2023-08-02 | Discharge: 2023-08-03 | Payer: BLUE CROSS/BLUE SHIELD

## 2023-08-02 DIAGNOSIS — Z23 Encounter for immunization: Principal | ICD-10-CM

## 2023-08-02 DIAGNOSIS — B20 Human immunodeficiency virus [HIV] disease: Principal | ICD-10-CM

## 2023-08-02 DIAGNOSIS — Z720 Tobacco use: Principal | ICD-10-CM

## 2023-08-02 DIAGNOSIS — Z8619 Personal history of other infectious and parasitic diseases: Principal | ICD-10-CM

## 2023-08-02 DIAGNOSIS — Z113 Encounter for screening for infections with a predominantly sexual mode of transmission: Principal | ICD-10-CM

## 2023-08-02 MED ORDER — NICOTINE (POLACRILEX) 2 MG GUM
BUCCAL | 3 refills | 5.00 days | Status: CP | PRN
Start: 2023-08-02 — End: ?

## 2023-08-02 MED ADMIN — cabotegravir-rilpivirine (CABENUVA) 600 mg-900 mg/3 mL extended-release injection 6 mL: 6 mL | INTRAMUSCULAR | @ 19:00:00 | Stop: 2023-08-02

## 2023-08-02 NOTE — Unmapped (Signed)
 Patient here for Cabenuva injections. Denies changes in mood or sleep. Tolerated well. Needle size: 23G 1 1/2 Next appointment made for 05/01.

## 2023-08-02 NOTE — Unmapped (Signed)
 Name: Zachary Jennings  Date: 08/02/2023  Address: [address] [address]   [place] of Residence:  [facility]  Phone: [phone]     Started assessment with patient options: in clinic    Is this the same address for mailing? Yes  If no, Mailing Address is:     What is your preferred method of contact? Phone Call    Is there anyone that you would want to add as your personal contact? No; if yes, please use SmartPhrase RWPersonalContactInfo to gather their contacts information.    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  Food Stamps/EBT    List Tax Household Members including relationship to you:   Wende Mott (Child), [employer]  (Child)     Someone in my household receives: No Household Income/Deductions of any kind  Specify who: Wende Mott (Child), [place] (Child)     Do you or anyone in your home have income adjustments? No    If yes, which adjustments do you have? N/A      Medication Access/Barriers: no, medicaid    Do you have a current diagnosis for Hepatitis C?  Lab Results   Component Value Date    HEPCAB Nonreactive 10/07/2020       Federal Marketplace Eligibility Assessment  Patient has affordable insurance through [employer], [place], and or Employment and is not eligible.    Patient given ACA education if they qualified based on answers to questions above.     MyChart  Do you have an active MyChart account? Yes     If MyChart is not set up, informed patient on how to set up MyChart N/A    Patient was informed of the following programs;   N/A    The following applications/handouts were given to patient:   N/A    The following forms were also started with the patient: N/A    Medicaid:  N/A      [place]/HMAP application status: Incomplete; patient needs to send proof of income (2 paystubs)    Patient is applying for [employer] on Charges Only     Additional Comments: Copywriter, advertising message for proof of income.    ____________________________________________________________________    The patient provides verbal consent and agrees to the following:    RW Consent  I agree to notify the interviewer within 30 days about any changes to my address, financial resources, expenses, family situation, or health insurance coverage that may impact my eligibility for Department payment programs. I certify that the information I have provided is accurate and complete to the best of my knowledge. I understand that information provided may be verified by a state reviewer, and I agree to submit any necessary financial records required for this review. I also understand that my employer may be asked to verify information concerning my income.    Caps on Charges Consent  The [employer] Program requires that both insured and uninsured individuals be charged no more than a specified maximum amount in a calendar year, based on their individual income.  Mickle Asper,  Benefits & Eligibility Coordinator  Time of Intervention: 5 minutes

## 2023-08-02 NOTE — Unmapped (Signed)
 Assessment/Plan    Zachary Jennings is a 38 y.o. male who presents to the Infectious Disease clinic for:    HIV  VL undetectable for many years.  Previously on Genvoya, transitioned to CAB/RPV in 02/2023. Liking this, VL undetectable in Nov. Continue CAB/RPV q2 months - dose received today.  Brief return labs at next CAB visit 09/2023.    Pharyngeal gonorrhea 02/2023. S/p CTX 500mg  x1 04/05/23 in our clinic.   TOC today. New sexual partner since then so will also check urine and rectal GC.    Tobacco Use: More interested in quitting today. Willing to try the nicotine gum. Currently 1/2 ppd  Nicotine gum prescribed today  Continue to address at each visit.    Chronic Pain/Neuropathy: h/o C3-4 ACDF in 2015 with revision 2017, so the former seems less likely. 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    Chronic cough, nasal congestion: Much improved. PCP prescribed fluticasone for allergic rhinitis, switched to beclomethasone due to interaction of fluticasone with Genvoya. No longer taking this.  Fluticasone is an option in the future now that he is on [place]    Sexual health  H/o syphilis Nov 2022 - s/p treatment at health dept with appropriate derease (1:32 --> 1:1)  Repeat today.    Sjogrens: Diagnosed by Rheum 05/2018. Minimal symptoms.    Health maintenance  Immunizations reviewed - UTD except COVID and flu, declined today.  Anal pap - negative 09/2020, to be done at next visit  Hepatitis: Hep C Ab neg 09/2018, Hep B immune 12/2014  TB: PPD neg 2013  CV Health: A1c normal 09/2020  Dental: referral placed at last visit to [facility], not yet scheduled    Immunization History   Administered Date(s) Administered    COVID-19 VACC,MRNA,(PFIZER)(PF) 01/14/2020, 02/04/2020    HEPATITIS B VACCINE ADULT,IM(ENERGIX B, RECOMBIVAX) 09/23/2011    Hep A / Hep B 09/23/2011    Hepatitis A (Adult) 09/23/2011, 04/10/2012, 11/26/2013    Hepatitis A Vaccine Pediatric / Adolescent 2 Dose IM 11/26/2013    Hepatitis B vaccine, pediatric/adolescent dosage, 09/23/2011    INFLUENZA TIV (TRI) 37MO+ W/ PRESERV (IM) 09/23/2011, 02/28/2012, 07/09/2013    INFLUENZA TIV (TRI) PF (IM)(HISTORICAL) 02/28/2012, 07/09/2013, 02/24/2014, 06/06/2016    Influenza LAIV (Nasal-Tri) HISTORICAL 09/23/2011    Influenza Vaccine Nasal-Quad (2-58yrs)(Flumist) 09/23/2011    Influenza Vaccine Quad(IM)6 MO-Adult(PF) 02/24/2014, 06/06/2016    Influenza Virus Vaccine, unspecified formulation 02/03/2017    PNEUMOCOCCAL POLYSACCHARIDE 23-VALENT 09/23/2011, 10/18/2017    PPD Test 09/23/2011    Pneumococcal Conjugate 13-Valent 10/16/2012    TdaP 09/23/2011       Disposition  Return to clinic  in 2 months  for next LAI visit, 6 months with me    I personally spent 30 minutes face-to-face and non-face-to-face in the care of this patient, which includes all pre, intra, and post visit time on the date of service.  All documented time was specific to the E/M visit and does not include any procedures that may have been performed.        Future Appointments   Date Time Provider Department Center   09/28/2023  9:30 AM [facility] NURSE [facility]                Subjective      Chief Complaint   Follow-up HIV    HPI  Zachary Jennings is a 38 y.o. male with HIV who presents for a routine HIV follow-up. he  is currently on Cabenuva for therapy and is tolerating well. Last dose 06/01/23, due today. Most recent VL ND, CD4 1,230/41% 03/2023.    Last seen by me in clinic 02/2023. Overall doing well with no concerns today. Here for [facility] visit.    Past Medical History  Reviewed and updated today - unchanged    Medications  Current Outpatient Medications   Medication Sig Dispense Refill    beclomethasone dipropionate (QNASL) 80 mcg/actuation nasal inhaler 2 sprays into each nostril daily. (Patient not taking: Reported on 06/01/2023) 10.6 g 0    cabotegravir-rilpivirine (CABENUVA) 600 mg-900 mg/3 mL extended-release injection Inject 6 mL into the muscle every thirty (30) days. 6 mL 1    cabotegravir-rilpivirine (CABENUVA) 600 mg-900 mg/3 mL extended-release injection Inject 6 mL into the muscle every 8 weeks. 6 mL 6    elvitegravir-cobicistat-emtricitabine-tenofovir (GENVOYA) 150-150-200-10 mg Tab tablet Take 1 tablet by mouth daily. (Patient not taking: Reported on 06/01/2023) 90 tablet 3    nicotine polacrilex (NICORETTE) 2 mg gum Apply 1 each (2 mg total) to cheek every hour as needed for smoking cessation. Weeks 1-6: Chew 1 piece of gum every 1-2 hours as needed 100 each 3     Current Facility-Administered Medications   Medication Dose Route Frequency Provider Last Rate Last Admin    dexAMETHasone (DECADRON) 4 mg/mL injection 20 mg  20 mg Intravenous Once PRN Lafi, Yousef N, CPP        diphenhydrAMINE (BENADRYL) injection 25 mg  25 mg Intravenous Once PRN Lafi, Yousef N, CPP        EPINEPHrine (EPIPEN) injection 0.3 mg  0.3 mg Intramuscular Once PRN Lafi, Yousef N, CPP        famotidine (PF) (PEPCID) injection 20 mg  20 mg Intravenous Once PRN Lafi, Yousef N, CPP        methylPREDNISolone sodium succinate (PF) (SOLU-Medrol) injection 125 mg  125 mg Intravenous Once PRN Lafi, Yousef N, CPP        sodium chloride (NS) 0.9 % infusion  20 mL/hr Intravenous Continuous PRN Lafi, Yousef N, CPP        sodium chloride 0.9% (NS) bolus 1,000 mL  1,000 mL Intravenous Once PRN Lafi, Yousef N, CPP             Social History  General - Living with 3 children and friend in [place]. Working at [employer] - hoping to advance in the company    Sexual History - previous male partner (his ex-wife). Has had male partners in the past, vers, oral sex as well. Most recent sexual partner was male - anal sex only    Substance Use - smokes <0.5PPD - working on quitting as above, no ETOH,    Family History   Reviewed    Review of Systems  As per HPI. Remainder of 10 systems reviewed, negative.    Allergies and Medications  Reviewed and updated with patient today. See bottom of this visit's encounter summary for details.        Objective      BP 112/74 (BP Site: L Arm, BP Position: Sitting, BP Cuff Size: Medium)  - Pulse 98  - Temp (S) 37.2 ??C (99 ??F) (Oral)  - Ht 175.3 cm (5' 9)  - Wt 64.4 kg (142 lb)  - BMI 20.97 kg/m??      Wt Readings from Last 6 Encounters:   08/02/23 64.4 kg (142 lb)   06/01/23 62.6 kg (138 lb)   04/05/23 63  kg (139 lb)   03/08/23 61.2 kg (135 lb)   08/31/22 63 kg (139 lb)   09/22/21 68.9 kg (151 lb 12.8 oz)     Const looks well or attentive, alert, appropriate.   Eyes sclerae anicteric, noninjected OU, EOMI   ENT OP clear without erythema or ulceration.    CV RRR. No murmurs. No rub or gallop. S1/S2.   Resp CTAB ant/post, normal work of breathing. No wheezes   GI Flat, soft. NTND. NABS.   GU deferred   Rectal deferred   Skin NO rashes or lesions on limited exam   Extremities No cyanosis, clubbing. No joint effusions, FROM without tenderness.   MSK No joint swelling   Neuro CN II-XII grossly intact, MAEE, non focal   Psych Appropriate affect. Eye contact good. Linear thoughts. Fluent speech.       Laboratory Data  Reviewed in Epic today, using Synopsis and Chart Review filters.  HIV RNA Quant Result   Date Value Ref Range Status   04/05/2023 Not Detected Not Detected Final   03/08/2023 Not Detected Not Detected Final   08/31/2022 Not Detected Not Detected Final   09/22/2021 Not Detected Not Detected Final   02/24/2014 Detected  Final   11/26/2013 Not Detected  Final   07/09/2013 Not Detected  Final   01/15/2013 Detected  Final     HIV RNA   Date Value Ref Range Status   02/24/2014 <40 /mL Final   01/15/2013 <40 /mL Final   10/16/2012 49 /mL Final   07/17/2012 < 40 COPIES/ML Final     Absolute CD4 Count   Date Value Ref Range Status   04/05/2023 1,230 510 - 2,320 /uL Final   08/31/2022 1,058 510 - 2,320 /uL Final   09/22/2021 1,161 510 - 2,320 /uL Final   10/07/2020 1,040 510-2,320 /uL Final   11/26/2013 970 510 - 2,320 /uL Final   07/09/2013 992 510 - 2,320 /uL Final 01/15/2013 855 510 - 2,320 /uL Final   10/16/2012 977 510 - 2,320 /uL Final       Lab Results   Component Value Date    WBC 7.7 04/05/2023    WBC 4.8 02/24/2014    HGB 13.5 04/05/2023    HGB 13.8 02/24/2014    HCT 41.6 04/05/2023    HCT 43.9 02/24/2014    Platelet 203 04/05/2023    Platelet 251 02/24/2014     Lab Results   Component Value Date    Creatinine 1.21 (H) 03/08/2023    Creatinine 1.14 02/24/2014     Lab Results   Component Value Date    AST 12 03/08/2023    AST 19 02/24/2014    ALT <7 (L) 03/08/2023    ALT 19 02/24/2014     Lab Results   Component Value Date    Glucose 99 03/08/2023    Hemoglobin A1C 4.9 10/07/2020     Lab Results   Component Value Date    HDL 38 (L) 10/25/2011    Non HDL Chol.  75 10/25/2011       Sexual Health Data  Gonorrhoeae NAA   Date Value Ref Range Status   03/08/2023 Negative Negative Final   03/08/2023 Positive (A) Negative Final   03/08/2023 Negative Negative Final   11/26/2013 NEGATIVE  Final   11/26/2013 NEGATIVE  Final   10/16/2012 NEGATIVE  Final   10/16/2012 NEGATIVE  Final     GC Source   Date Value Ref Range Status  11/26/2013 THROAT  Final   11/26/2013 RANDOM URINE  Final   10/16/2012 THROAT  Final   10/16/2012 RANDOM URINE  Final     Chlamydia trachomatis, NAA   Date Value Ref Range Status   03/08/2023 Negative Negative Final   03/08/2023 Negative Negative Final   03/08/2023 Negative Negative Final   11/26/2013 NEGATIVE  Final     Comment:     Test performed using the Hologic APTIMA Combo 2 Assay, an  FDA-approved target amplification nucleic acid probe test  for the qualitative detection of Chlamydia trachomatis and  Neisseria gonorrhoeae. The assay is not FDA-approved for  rectal, throat and male urine specimens, however, the  performance characteristics have been validated by the [facility] Clinical Molecular Microbiology Laboratory.     11/26/2013 NEGATIVE  Final     Comment:     Test performed using the Hologic APTIMA Combo 2 Assay, an  FDA-approved target amplification nucleic acid probe test  for the qualitative detection of Chlamydia trachomatis and  Neisseria gonorrhoeae. The assay is not FDA-approved for  rectal, throat and male urine specimens, however, the  performance characteristics have been validated by the [facility] Clinical Molecular Microbiology Laboratory.     10/16/2012 NEGATIVE  Final   10/16/2012 NEGATIVE  Final     Hepatitis C Ab   Date Value Ref Range Status   10/07/2020 Nonreactive Nonreactive Final     Comment:     Antibodies to HCV were not detected.  A nonreactive result does not exclude the possibility of exposure to HCV.   10/19/2018 Nonreactive Nonreactive Final     Comment:     Antibodies to HCV were not detected.  A nonreactive result does not exclude the possibility of exposure to HCV.   06/06/2016 Nonreactive Nonreactive Final     Comment:       Antibodies to HCV were not detected.  A nonreactive result does not exclude the possibility of exposure to HCV.   10/25/2011 Negative  Final     Comment:     Antibodies to HCV were NOT detected.  A nonreactive result  does not exclude the possibility of exposure to HCV.

## 2023-08-03 LAB — RPR, THERAPY MONITORING: SYPHILIS RPR SCREEN: NONREACTIVE

## 2023-08-09 NOTE — Unmapped (Signed)
 Patient completed [employer] application. Eligible for RW B&C grant services and Caps on Charges. IPL= 393%, FPL= 231%. Expires 05/29/2024    RW Eligibility Form informing patient about RW services and Caps on charges was sent to patient via MyChart Message          Mickle Asper,  Benefits & Eligibility Coordinator  Time of Intervention: 2 minutes

## 2023-08-30 DIAGNOSIS — K0889 Other specified disorders of teeth and supporting structures: Principal | ICD-10-CM

## 2023-08-30 DIAGNOSIS — Z9189 Other specified personal risk factors, not elsewhere classified: Principal | ICD-10-CM

## 2023-08-30 NOTE — Unmapped (Signed)
 Referral to [facility] placed.

## 2023-09-05 DIAGNOSIS — B20 Human immunodeficiency virus [HIV] disease: Principal | ICD-10-CM

## 2023-09-08 MED ORDER — NICOTINE 21 MG/24 HR DAILY TRANSDERMAL PATCH
MEDICATED_PATCH | TRANSDERMAL | 0 refills | 42.00 days | Status: CP
Start: 2023-09-08 — End: 2023-10-20

## 2023-09-08 NOTE — Unmapped (Signed)
 Addended by: Jazmin Vensel on: 09/08/2023 02:16 PM     Modules accepted: Orders

## 2023-09-20 NOTE — Unmapped (Signed)
 [facility] Delivery [facility] Clinic Administered Medication Refill Coordination Note      NAME:Zachary Jennings DOB: 10-29-1985      Medication: Cabenuva     Day Supply: 56 days      SHIPPING      Next delivery from [facility] [phone]) to [facility] at [facility] [name] is scheduled for 04/30    Clinic contact: [name]    Patient's next nurse visit for administration: 05/01    We will follow up with clinic monthly for standard refill processing and delivery.      [name]  [facility] Specialty and [facility]

## 2023-09-26 MED FILL — CABENUVA 600 MG/3 ML-900 MG/3 ML IM SUSPENSION, EXTENDED RELEASE: INTRAMUSCULAR | 34 days supply | Qty: 6 | Fill #2

## 2023-09-28 ENCOUNTER — Ambulatory Visit: Admit: 2023-09-28 | Payer: Medicaid (Managed Care)

## 2023-10-02 ENCOUNTER — Ambulatory Visit: Admit: 2023-10-02 | Discharge: 2023-10-03 | Payer: Medicaid (Managed Care)

## 2023-10-02 DIAGNOSIS — B20 Human immunodeficiency virus [HIV] disease: Principal | ICD-10-CM

## 2023-10-02 MED ADMIN — cabotegravir-rilpivirine (CABENUVA) 600 mg-900 mg/3 mL extended-release injection 6 mL: 6 mL | INTRAMUSCULAR | @ 14:00:00 | Stop: 2023-10-02

## 2023-10-02 NOTE — Unmapped (Signed)
 Patient here for Cabenuva injections. Denies changes in mood or sleep. Tolerated well. Needle size: 23G 1 1/2 Next appointment made for 06/26.

## 2023-11-15 DIAGNOSIS — B20 Human immunodeficiency virus [HIV] disease: Principal | ICD-10-CM

## 2023-11-15 MED ORDER — CABENUVA 600 MG/3 ML-900 MG/3 ML IM SUSPENSION, EXTENDED RELEASE
INTRAMUSCULAR | 6 refills | 56.00000 days
Start: 2023-11-15 — End: ?

## 2023-11-17 DIAGNOSIS — B20 Human immunodeficiency virus [HIV] disease: Principal | ICD-10-CM

## 2023-11-17 MED ORDER — CABOTEGRAVIR ER 600 MG/3 ML-RILPIVIRINE ER 900 MG/3ML IM SUSPENSION,ER
INTRAMUSCULAR | 6 refills | 56.00000 days | Status: CP
Start: 2023-11-17 — End: ?
  Filled 2023-11-21: qty 6, 34d supply, fill #0

## 2023-11-17 NOTE — Unmapped (Signed)
 [facility] Specialty and Home Delivery Pharmacy Clinic Administered Medication Refill Coordination Note      NAME:Zachary Jennings DOB: 11-12-1985      Medication: Cabenuva     Day Supply: 56 days      SHIPPING      Next delivery from [facility] Delivery Pharm[facility] [phone]) to [facility] Infectious Disease Clinic at [facility] [name] is scheduled for 06/25    Clinic contact: [name]    Patient's next nurse visit for administration: 06/26    We will follow up with clinic monthly for standard refill processing and delivery.      Khale Nigh LITTIE Hope  [facility] Specialty and [facility]

## 2023-11-23 DIAGNOSIS — B20 Human immunodeficiency virus [HIV] disease: Principal | ICD-10-CM

## 2023-11-23 MED ADMIN — cabotegravir-rilpivirine (CABENUVA) 600 mg-900 mg/3 mL extended-release injection 6 mL: 6 mL | INTRAMUSCULAR | @ 15:00:00 | Stop: 2023-11-23

## 2023-11-23 NOTE — Unmapped (Signed)
 Patient here for Cabenuva  injections. Denies changes in mood or sleep. Tolerated well. Needle size: 23G 1 1/2 Next appointment made for 01/11/2024

## 2023-12-25 DIAGNOSIS — B20 Human immunodeficiency virus [HIV] disease: Principal | ICD-10-CM

## 2024-01-05 NOTE — Unmapped (Signed)
 [facility] Specialty and Home Delivery Pharmacy Clinic Administered Medication Refill Coordination Note      NAME:Zachary Jennings DOB: 04/22/1986      Medication: Cabenuva     Day Supply: 56 days      SHIPPING      Next delivery from [facility] Delivery Pharm[facility] [phone]) to [facility] at [facility] [name] is scheduled for 08/12    Clinic contact: [name]    Patient's next nurse visit for administration: 08/14    We will follow up with clinic monthly for standard refill processing and delivery.      Zachary Jennings  [facility] Specialty and [facility]

## 2024-01-08 MED FILL — CABENUVA 600 MG/3 ML-900 MG/3 ML IM SUSPENSION, EXTENDED RELEASE: INTRAMUSCULAR | 34 days supply | Qty: 6 | Fill #1

## 2024-01-11 ENCOUNTER — Encounter: Admit: 2024-01-11 | Discharge: 2024-01-11 | Payer: Medicaid (Managed Care)

## 2024-01-11 DIAGNOSIS — Z9189 Other specified personal risk factors, not elsewhere classified: Principal | ICD-10-CM

## 2024-01-11 DIAGNOSIS — Z79899 Other long term (current) drug therapy: Principal | ICD-10-CM

## 2024-01-11 DIAGNOSIS — B20 Human immunodeficiency virus [HIV] disease: Principal | ICD-10-CM

## 2024-01-11 DIAGNOSIS — Z5181 Encounter for therapeutic drug level monitoring: Principal | ICD-10-CM

## 2024-01-11 DIAGNOSIS — K0889 Other specified disorders of teeth and supporting structures: Principal | ICD-10-CM

## 2024-01-11 LAB — CBC W/ AUTO DIFF
BASOPHILS ABSOLUTE COUNT: 0 10*9/L (ref 0.0–0.1)
BASOPHILS RELATIVE PERCENT: 0.6 %
EOSINOPHILS ABSOLUTE COUNT: 0.4 10*9/L (ref 0.0–0.5)
EOSINOPHILS RELATIVE PERCENT: 5.3 %
HEMATOCRIT: 43 % (ref 39.0–48.0)
HEMOGLOBIN: 14.5 g/dL (ref 12.9–16.5)
LYMPHOCYTES ABSOLUTE COUNT: 3.2 10*9/L (ref 1.1–3.6)
LYMPHOCYTES RELATIVE PERCENT: 43.7 %
MEAN CORPUSCULAR HEMOGLOBIN CONC: 33.8 g/dL (ref 32.0–36.0)
MEAN CORPUSCULAR HEMOGLOBIN: 26.3 pg (ref 25.9–32.4)
MEAN CORPUSCULAR VOLUME: 77.7 fL (ref 77.6–95.7)
MEAN PLATELET VOLUME: 7 fL (ref 6.8–10.7)
MONOCYTES ABSOLUTE COUNT: 0.7 10*9/L (ref 0.3–0.8)
MONOCYTES RELATIVE PERCENT: 8.9 %
NEUTROPHILS ABSOLUTE COUNT: 3.1 10*9/L (ref 1.8–7.8)
NEUTROPHILS RELATIVE PERCENT: 41.5 %
PLATELET COUNT: 329 10*9/L (ref 150–450)
RED BLOOD CELL COUNT: 5.53 10*12/L (ref 4.26–5.60)
RED CELL DISTRIBUTION WIDTH: 13.9 % (ref 12.2–15.2)
WBC ADJUSTED: 7.4 10*9/L (ref 3.6–11.2)

## 2024-01-11 LAB — LYMPH MARKER LIMITED,FLOW
ABSOLUTE CD3 CNT: 2368 {cells}/uL (ref 915–3400)
ABSOLUTE CD4 CNT: 1376 {cells}/uL (ref 510–2320)
ABSOLUTE CD8 CNT: 960 {cells}/uL (ref 180–1520)
CD3% (T CELLS): 74 % (ref 61–86)
CD4% (T HELPER): 43 % (ref 34–58)
CD4:CD8 RATIO: 1.4 (ref 0.9–4.8)
CD8% T SUPPRESR: 30 % (ref 12–38)

## 2024-01-11 LAB — BASIC METABOLIC PANEL
ANION GAP: 7 mmol/L (ref 5–14)
BLOOD UREA NITROGEN: 9 mg/dL (ref 9–23)
BUN / CREAT RATIO: 9
CALCIUM: 10 mg/dL (ref 8.7–10.4)
CHLORIDE: 108 mmol/L — ABNORMAL HIGH (ref 98–107)
CO2: 27.3 mmol/L (ref 20.0–31.0)
CREATININE: 1.01 mg/dL (ref 0.73–1.18)
EGFR CKD-EPI (2021) MALE: >90 mL/min/1.73m2 (ref >=60)
GLUCOSE RANDOM: 89 mg/dL (ref 70–179)
POTASSIUM: 4.1 mmol/L (ref 3.4–4.8)
SODIUM: 142 mmol/L (ref 135–145)

## 2024-01-11 LAB — AST: AST (SGOT): 17 U/L (ref <=34)

## 2024-01-11 LAB — HIV RNA, QUANTITATIVE, PCR: HIV RNA QNT RSLT: NOT DETECTED

## 2024-01-11 LAB — ALT: ALT (SGPT): 12 U/L (ref 10–49)

## 2024-01-11 LAB — BILIRUBIN, TOTAL: BILIRUBIN TOTAL: 0.3 mg/dL (ref 0.3–1.2)

## 2024-01-11 MED ADMIN — cabotegravir-rilpivirine (CABENUVA) 600 mg-900 mg/3 mL extended-release injection 6 mL: 6 mL | INTRAMUSCULAR | @ 17:00:00 | Stop: 2024-01-11

## 2024-01-11 NOTE — Unmapped (Signed)
 Patient here for Cabenuva  injections. Denies changes in mood or sleep. Tolerated well. Needle size: 23G 1 1/2 Next appointment made for 03/07/2024.

## 2024-02-07 NOTE — Unmapped (Unsigned)
 Assessment/Plan    Zachary Jennings is a 38 y.o. male who presents to the Infectious Disease clinic for:    HIV  VL undetectable for many years.  Previously on Genvoya , transitioned to CAB/RPV in 02/2023. Liking this, VL undetectable in Aug 2025. Continue CAB/RPV q2 months - dose received 01/11/24.  Brief return labs in 5 months    Tobacco Use: More interested in quitting today. Willing to try the nicotine  gum. Currently 1/2 ppd  Nicotine  gum prescribed today  Continue to address at each visit.    Pharyngeal gonorrhea 02/2023. S/p CTX 500mg  x1 04/05/23 in our clinic. TOC negative 08/02/23    Chronic Pain/Neuropathy: h/o C3-4 ACDF in 2015 with revision 2017, so the former seems less likely. 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    Chronic cough, nasal congestion: Much improved. PCP prescribed fluticasone for allergic rhinitis, switched to beclomethasone due to interaction of fluticasone with Genvoya . No longer taking this.  Fluticasone is an option in the future now that he is on Cabenuva     Sexual health  H/o syphilis Nov 2022 - s/p treatment at health dept with appropriate derease (1:32 --> 1:1). NR since 08/31/22.  Repeat with next labs.    Sjogrens: Diagnosed by Rheum 05/2018. Minimal symptoms.    Health maintenance  Immunizations reviewed - UTD except COVID and flu, declined today.  Anal pap - negative 09/2020, to be done at next visit  Hepatitis: Hep C Ab neg 09/2018, Hep B immune 12/2014  TB: PPD neg 2013  CV Health: A1c normal 09/2020  Dental: referral placed at last visit to [facility], not yet scheduled    Immunization History   Administered Date(s) Administered    COVID-19 VACC,MRNA,(PFIZER)(PF) 01/14/2020, 02/04/2020    HEPATITIS B VACCINE ADULT,IM(ENERGIX B, RECOMBIVAX) 09/23/2011    Hep A / Hep B 09/23/2011    Hepatitis A (Adult) 09/23/2011, 04/10/2012, 11/26/2013    Hepatitis A Vaccine Pediatric / Adolescent 2 Dose IM 11/26/2013    Hepatitis B vaccine, pediatric/adolescent dosage, 09/23/2011    INFLUENZA TIV (TRI) 80MO+ W/ PRESERV (IM) 09/23/2011, 02/28/2012, 07/09/2013    INFLUENZA TIV (TRI) PF (IM)(HISTORICAL) 02/28/2012, 07/09/2013, 02/24/2014, 06/06/2016    Influenza LAIV (Nasal-Tri) HISTORICAL 09/23/2011    Influenza Vaccine Nasal-Quad (2-70yrs)(Flumist) 09/23/2011    Influenza Vaccine Quad(IM)6 MO-Adult(PF) 02/24/2014, 06/06/2016    Influenza Virus Vaccine, unspecified formulation 02/03/2017    PNEUMOCOCCAL POLYSACCHARIDE 23-VALENT 09/23/2011, 10/18/2017    PPD Test 09/23/2011    Pneumococcal Conjugate 13-Valent 10/16/2012    TdaP 09/23/2011       Disposition  Return to clinic in 2 months for next LAI visit, 6 months with me    I personally spent *** minutes face-to-face and non-face-to-face in the care of this patient, which includes all pre, intra, and post visit time on the date of service.  All documented time was specific to the E/M visit and does not include any procedures that may have been performed.    {Documented time must be specific to the E/M visit. Billable E/M time excludes pre/intra/post procedural work and any performed procedures. If using a time statement, detail how the separate time was spent, e.g., discussing prescription drug management, PT/OT, etc. Independent time should be discreet, non-overlapping minutes with other providers (APP/residents) involved in the patient's care. (Optional):[id number]}        Future Appointments   Date Time Provider Department Center   02/07/2024  1:00 PM Anacleto Batterman, Damien Bradley, MD [facility]  03/07/2024  2:30 PM MEDIND NURSE [facility]                Subjective      Chief Complaint   Follow-up HIV    HPI  Zachary Jennings is a 38 y.o. male with HIV who presents for a routine HIV follow-up. he is currently on Cabenuva  for therapy and is tolerating well. Last dose 06/01/23, due today. Most recent VL ND, CD4 1,230/41% 03/2023.    Last seen by me in clinic 02/2023. Overall doing well with no concerns today. Here for Cabenuva  visit.    Past Medical History  Reviewed and updated today - unchanged    Medications  Current Outpatient Medications   Medication Sig Dispense Refill    cabotegravir -rilpivirine  (CABENUVA ) 600 mg-900 mg/3 mL extended-release injection Inject 6 mL into the muscle every thirty (30) days. 6 mL 1    cabotegravir -rilpivirine  (CABENUVA ) 600 mg-900 mg/3 mL extended-release injection Inject 6 mL into the muscle every 8 weeks. 6 mL 6    nicotine  polacrilex (NICORETTE) 2 mg gum Apply 1 each (2 mg total) to cheek every hour as needed for smoking cessation. Weeks 1-6: Chew 1 piece of gum every 1-2 hours as needed 100 each 3    valACYclovir (VALTREX) 1000 MG tablet Take 1 tablet (1,000 mg total) by mouth Three (3) times a day.       No current facility-administered medications for this visit.         Social History  General - Living with 3 children and friend in [place]. Working at [employer] - hoping to advance in the company    Sexual History - previous male partner (his ex-wife). Has had male partners in the past, vers, oral sex as well. Most recent sexual partner was male - anal sex only    Substance Use - smokes <0.5PPD - working on quitting as above, no ETOH,    Family History   Reviewed    Review of Systems  As per HPI. Remainder of 10 systems reviewed, negative.    Allergies and Medications  Reviewed and updated with patient today. See bottom of this visit's encounter summary for details.        Objective      There were no vitals taken for this visit.     Wt Readings from Last 6 Encounters:   01/11/24 67.6 kg (149 lb)   11/23/23 68.9 kg (151 lb 12.8 oz)   10/02/23 67.6 kg (149 lb)   08/02/23 64.4 kg (142 lb)   06/01/23 62.6 kg (138 lb)   04/05/23 63 kg (139 lb)     Const looks well or attentive, alert, appropriate.   Eyes sclerae anicteric, noninjected OU, EOMI   ENT OP clear without erythema or ulceration.    CV RRR. No murmurs. No rub or gallop. S1/S2.   Resp CTAB ant/post, normal work of breathing. No wheezes   GI Flat, soft. NTND. NABS.   GU deferred   Rectal deferred   Skin NO rashes or lesions on limited exam   Extremities No cyanosis, clubbing. No joint effusions, FROM without tenderness.   MSK No joint swelling   Neuro CN II-XII grossly intact, MAEE, non focal   Psych Appropriate affect. Eye contact good. Linear thoughts. Fluent speech.       Laboratory Data  Reviewed in Epic today, using Synopsis and Chart Review filters.  HIV RNA Quant Result   Date Value Ref Range Status   01/11/2024  Not Detected Not Detected Final   04/05/2023 Not Detected Not Detected Final   03/08/2023 Not Detected Not Detected Final   08/31/2022 Not Detected Not Detected Final   02/24/2014 Detected  Final   11/26/2013 Not Detected  Final   07/09/2013 Not Detected  Final   01/15/2013 Detected  Final     HIV RNA   Date Value Ref Range Status   02/24/2014 <40 /mL Final   01/15/2013 <40 /mL Final   10/16/2012 49 /mL Final   07/17/2012 < 40 COPIES/ML Final     Absolute CD4 Count   Date Value Ref Range Status   01/11/2024 1,376 510 - 2,320 /uL Final   04/05/2023 1,230 510 - 2,320 /uL Final   08/31/2022 1,058 510 - 2,320 /uL Final   09/22/2021 1,161 510 - 2,320 /uL Final   11/26/2013 970 510 - 2,320 /uL Final   07/09/2013 992 510 - 2,320 /uL Final   01/15/2013 855 510 - 2,320 /uL Final   10/16/2012 977 510 - 2,320 /uL Final       Lab Results   Component Value Date    WBC 7.4 01/11/2024    WBC 4.8 02/24/2014    HGB 14.5 01/11/2024    HGB 13.8 02/24/2014    HCT 43.0 01/11/2024    HCT 43.9 02/24/2014    Platelet 329 01/11/2024    Platelet 251 02/24/2014     Lab Results   Component Value Date    Creatinine 1.01 01/11/2024    Creatinine 1.14 02/24/2014     Lab Results   Component Value Date    AST 17 01/11/2024    AST 19 02/24/2014    ALT 12 01/11/2024    ALT 19 02/24/2014     Lab Results   Component Value Date    Glucose 89 01/11/2024    Hemoglobin A1C 4.9 10/07/2020     Lab Results   Component Value Date    HDL 38 (L) 10/25/2011    Non HDL Chol.  75 10/25/2011       Sexual Health Data  Gonorrhoeae NAA   Date Value Ref Range Status   08/02/2023 Negative Negative Final   08/02/2023 Negative Negative Final   03/08/2023 Negative Negative Final   03/08/2023 Positive (A) Negative Final   03/08/2023 Negative Negative Final   11/26/2013 NEGATIVE  Final   11/26/2013 NEGATIVE  Final   10/16/2012 NEGATIVE  Final   10/16/2012 NEGATIVE  Final     GC Source   Date Value Ref Range Status   11/26/2013 THROAT  Final   11/26/2013 RANDOM URINE  Final   10/16/2012 THROAT  Final   10/16/2012 RANDOM URINE  Final     Chlamydia trachomatis, NAA   Date Value Ref Range Status   08/02/2023 Negative Negative Final   08/02/2023 Negative Negative Final   03/08/2023 Negative Negative Final   03/08/2023 Negative Negative Final   03/08/2023 Negative Negative Final   11/26/2013 NEGATIVE  Final     Comment:     Test performed using the Hologic APTIMA Combo 2 Assay, an  FDA-approved target amplification nucleic acid probe test  for the qualitative detection of Chlamydia trachomatis and  Neisseria gonorrhoeae. The assay is not FDA-approved for  rectal, throat and male urine specimens, however, the  performance characteristics have been validated by the [facility] Clinical Molecular Microbiology Laboratory.     11/26/2013 NEGATIVE  Final     Comment:     Test  performed using the Hologic APTIMA Combo 2 Assay, an  FDA-approved target amplification nucleic acid probe test  for the qualitative detection of Chlamydia trachomatis and  Neisseria gonorrhoeae. The assay is not FDA-approved for  rectal, throat and male urine specimens, however, the  performance characteristics have been validated by the [facility] Clinical Molecular Microbiology Laboratory.     10/16/2012 NEGATIVE  Final   10/16/2012 NEGATIVE  Final     Hepatitis C Ab   Date Value Ref Range Status   10/07/2020 Nonreactive Nonreactive Final     Comment:     Antibodies to HCV were not detected. A nonreactive result does not exclude the possibility of exposure to HCV.   10/19/2018 Nonreactive Nonreactive Final     Comment:     Antibodies to HCV were not detected.  A nonreactive result does not exclude the possibility of exposure to HCV.   06/06/2016 Nonreactive Nonreactive Final     Comment:       Antibodies to HCV were not detected.  A nonreactive result does not exclude the possibility of exposure to HCV.   10/25/2011 Negative  Final     Comment:     Antibodies to HCV were NOT detected.  A nonreactive result  does not exclude the possibility of exposure to HCV.

## 2024-02-07 NOTE — Unmapped (Unsigned)
 [place] [facility] at [facility] Checklist     Type of visit:  {Telephone Video Face to face:71720}    Are you located in [place]? {yes:[address]}    Reason for visit: ***    Questions / Concerns that need to be addressed: ***    General Consent to Treat (GCT) for epic video visits only: {IMC HRU:32277}    HCDM reviewed and updated in Epic:    We are working to make sure all of our patients??? wishes are updated in Epic and part of that is documenting a Environmental health practitioner for each patient  A Health Care Decision Maker is someone you choose who can make health care decisions for you if you are not able - who would you most want to do this for you????  {Updated:56707}      COVID-19 Vaccine Summary  Which COVID-19 Vaccine was administered  Pfizer  Type:  Complete  Dates Given:                   If no: Are you interested in scheduling? {COVIDVACCINEINTENT:75248}

## 2024-02-11 DIAGNOSIS — B20 Human immunodeficiency virus [HIV] disease: Principal | ICD-10-CM

## 2024-02-16 DIAGNOSIS — Z23 Encounter for immunization: Principal | ICD-10-CM

## 2024-02-16 DIAGNOSIS — Z202 Contact with and (suspected) exposure to infections with a predominantly sexual mode of transmission: Principal | ICD-10-CM

## 2024-02-16 DIAGNOSIS — Z8619 Personal history of other infectious and parasitic diseases: Principal | ICD-10-CM

## 2024-02-16 DIAGNOSIS — Z113 Encounter for screening for infections with a predominantly sexual mode of transmission: Principal | ICD-10-CM

## 2024-02-16 MED ORDER — DOXYCYCLINE MONOHYDRATE 100 MG CAPSULE
ORAL_CAPSULE | Freq: Once | ORAL | 0 refills | 0.00000 days | Status: CP | PRN
Start: 2024-02-16 — End: ?

## 2024-02-16 NOTE — Unmapped (Signed)
 INFECTIOUS DISEASES CLINIC  [address]  [place], [place]  [address]  P [phone]  F [phone]       The patient reports they are physically located in   and is currently: at home. I conducted a audio/video visit. I spent  56m 26s on the video call with the patient. I spent an additional 10 minutes on pre- and post-visit activities on the date of service .     Primary care provider: Valma Lannie Faden, [facility]    Assessment/Plan:      HIV  - chronic, stable  Primary ID Provider: Dr. Damien Birch  Overall stable. Current regimen: Cabenuva  (CAB/RPV)  Misses doses of ARVs never    Med access via Medicaid  CD4 count as noted below  Discussed ARV adherence    Lab Results   Component Value Date    ACD4 1,376 01/11/2024    CD4 43 01/11/2024    HIVRS Not Detected 01/11/2024    HIVCP <40 02/24/2014     No labs today - also reviewed by Dr. Ciccone  Continue current therapy  Encouraged continued excellent ARV adherence  Assessment & Plan  Exposure to STI - chronic  History of prior STI  Discussed Doxy PEP to prevent syphilis infection  Patient amenable to starting this.  Script for Doxycycline  200mg  PO x 1 dose within 24-72 hours of sexual encounter. Not to take more than 200mg  in 24 hour period. #30.  Encouraged routine STI testing.  Doxycycline  use:  Discussed signs and symptoms of recurrent infection to watch out for, such as fever/chills, fatigue, wound erythema/swelling/drainage, or increased pain.   Discussed side effects of antibiotics, including nausea/vomiting, allergic reaction, diarrhea, anorexia. In particular, discussed side effects of doxy: importance of taking with water and remaining upright x 20 min., and wearing sunscreen as it will increase skin sensitivity to sun.  - Prescribe doxycycline  for post-exposure prophylaxis, to be filled as needed.  - Order STI testing for syphilis, gonorrhea, and chlamydia at next visit with Cabenuva .  - Emphasize importance of regular STI testing and symptom reporting.    Tobacco use disorder (nicotine  dependence, cigarettes)  Currently smoking half a pack per day. Interested in quitting but previous attempts with patches and gum were unsuccessful. Not interested in smoking cessation program.    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 Pain occurs when battery is low.    Sjogren's syndrome  No new symptoms reported.    Food insecurity  Experiencing food insecurity, especially towards the end of the month. Receives $130/month in SNAP benefits, insufficient for family needs. Works as a Company secretary at [employer]. Struggles to provide enough food for two children, ages 23 and 108.  - Refer to dietitian for local food resources and additional support. Karilyn Areola will reach out.    Vaccinations  Due for tetanus shot. Hesitant about flu and COVID vaccines but advised to get tested if symptomatic during fall and winter.  - Administer tetanus shot at next visit - orders placed  - Encourage flu and COVID vaccinations but advise testing if symptomatic.     Sexual health & secondary prevention    Lab Results   Component Value Date    RPR Nonreactive 08/02/2023    CTNAA Negative 08/02/2023    CTNAA Negative 08/02/2023    CTNAA Negative 03/08/2023    CTNAA Negative 03/08/2023    CTNAA Negative 03/08/2023  GCNAA Negative 08/02/2023    GCNAA Negative 08/02/2023    GCNAA Negative 03/08/2023    GCNAA Positive (A) 03/08/2023    GCNAA Negative 03/08/2023    SPECTYPE Swab 08/02/2023    SPECTYPE Urine 08/02/2023    SPECTYPE Swab 03/08/2023    SPECTYPE Swab 03/08/2023    SPECTYPE Urine 03/08/2023    SPECSOURCE Rectum 08/02/2023    SPECSOURCE Urine 08/02/2023    SPECSOURCE Rectum 03/08/2023    SPECSOURCE Throat 03/08/2023    SPECSOURCE Urine 03/08/2023     GC/CT NAATs -- needed but deferred to future visit  RPR -- needed but deferred to future visit    Immunization History   Administered Date(s) Administered    COVID-19 VACC,MRNA,(PFIZER)(PF) 01/14/2020, 02/04/2020    HEPATITIS B VACCINE ADULT,IM(ENERGIX B, RECOMBIVAX) 09/23/2011    Hep A / Hep B 09/23/2011    Hepatitis A (Adult) 09/23/2011, 04/10/2012, 11/26/2013    Hepatitis A Vaccine Pediatric / Adolescent 2 Dose IM 11/26/2013    Hepatitis B vaccine, pediatric/adolescent dosage, 09/23/2011    INFLUENZA TIV (TRI) 49MO+ W/ PRESERV (IM) 09/23/2011, 02/28/2012, 07/09/2013    INFLUENZA TIV (TRI) PF (IM)(HISTORICAL) 02/28/2012, 07/09/2013, 02/24/2014, 06/06/2016    Influenza LAIV (Nasal-Tri) HISTORICAL 09/23/2011    Influenza Vaccine Nasal-Quad (2-5yrs)(Flumist) 09/23/2011    Influenza Vaccine Quad(IM)6 MO-Adult(PF) 02/24/2014, 06/06/2016    Influenza Virus Vaccine, unspecified formulation 02/03/2017    PNEUMOCOCCAL POLYSACCHARIDE 23-VALENT 09/23/2011, 10/18/2017    PPD Test 09/23/2011    Pneumococcal Conjugate 13-Valent 10/16/2012    TdaP 09/23/2011     Immunizations ordered today: Tdap with Cabenuva  shot.    I personally spent 25 minutes face-to-face and non-face-to-face in the care of this patient, which includes all pre, intra, and post visit time on the date of service.  All documented time was specific to the E/M visit and does not include any procedures that may have been performed.      Disposition  Return to clinic 5-6 months or sooner if needed.    Nataliyah Packham, FNP-BC  [facility] Diseases [facility] at [facility]  [address], [place], [place] [address]    Phone: [phone]   Fax: [phone]          Subjective:      Chief Complaint   HIV followup    HPI  History of Present Illness  02/16/2024  Zachary Jennings is a 38 year old male who presents for routine follow-up and STI testing.    He has a history of syphilis and is seeking STI testing, including for syphilis, gonorrhea, and chlamydia. He has not been sexually active recently but is interested in having a prescription for doxycycline  available for potential future use.    He usually does not get the flu shot and is on the fence about receiving the COVID vaccine.    He smokes half a pack of cigarettes a day and has previously attempted to quit using patches and gum without success. He is interested in quitting but is not currently pursuing additional cessation programs.    He has a history of chronic pain, which is well-managed since his spinal infusion surgery. He experiences pain only when the battery for his spinal device needs charging.    He lives with his two children, aged fourteen and eight, and sometimes struggles with food security, receiving $130 a month in SNAP benefits. He works as a Company secretary for [employer]    No new symptoms related to his Sjogren's syndrome. No new medical  issues since his last visit in March.        Past Medical History[1]    Medications and Allergies   Reviewed and updated today. See bottom of this visit's encounter summary for details.  Meds ordered prior to current encounter[2]    Allergies[3]    Social History  Social History     Tobacco Use    Smoking status: Every Day     Current packs/day: 0.50     Average packs/day: 0.5 packs/day for 20.3 years (10.2 ttl pk-yrs)     Types: Cigarettes     Start date: 10/17/2003    Smokeless tobacco: Never    Tobacco comments:     0.5 ppd.     Substance Use Topics    Alcohol use: No     Alcohol/week: 1.0 standard drink of alcohol     Types: 1 Standard drinks or equivalent per week       Review of Systems  As per HPI. Remainder of 10 systems reviewed, negative.        Objective:      There were no vitals taken for this visit. Video visit    Const looks well and attentive, alert, appropriate   Psych Appropriate affect. Eye contact good. Linear thoughts. Fluent speech.     Laboratory Data  Reviewed in Epic today, using Synopsis and Chart Review filters.    Lab Results   Component Value Date    CREATININE 1.01 01/11/2024    QFTTBGOLD Negative 10/07/2020    HEPCAB Nonreactive 10/07/2020    CHOL 113 10/25/2011 HDL 38 (L) 10/25/2011    NONHDL 75 10/25/2011    A1C 4.9 10/07/2020    FINALDX  10/07/2020     A: Anal Pap  - Negative for intraepithelial lesion or malignancy.     This electronic signature is attestation that the pathologist personally reviewed the submitted material(s) and the final diagnosis reflects that evaluation.                           [1]   Past Medical History:  Diagnosis Date    Cervical spinal stenosis     HIV disease    (CMS-HCC)    [2]   Current Outpatient Medications on File Prior to Visit   Medication Sig    cabotegravir -rilpivirine  (CABENUVA ) 600 mg-900 mg/3 mL extended-release injection Inject 6 mL into the muscle every thirty (30) days.    cabotegravir -rilpivirine  (CABENUVA ) 600 mg-900 mg/3 mL extended-release injection Inject 6 mL into the muscle every 8 weeks.    nicotine  polacrilex (NICORETTE) 2 mg gum Apply 1 each (2 mg total) to cheek every hour as needed for smoking cessation. Weeks 1-6: Chew 1 piece of gum every 1-2 hours as needed    valACYclovir (VALTREX) 1000 MG tablet Take 1 tablet (1,000 mg total) by mouth Three (3) times a day.     No current facility-administered medications on file prior to visit.   [3]   Allergies  Allergen Reactions    Amoxicillin Hives    Prednisone Nausea And Vomiting

## 2024-02-16 NOTE — Unmapped (Signed)
 RD screened pt after ID provider visit today. Pt has SNAP benefits but does not utilize food pantries.     Interventions given:  Double Event organiser through the [employer] in [place], [place]  SNAP-associated programs list    Food Insecurity:    I'm going to read you two statements that people have made about their food situation. For each statement, please tell me whether the statement was often true, sometimes true, or never true for your household in the 12 months.      1. ???We worried whether our food would run out before we got money to buy more.???   Sometimes True     2. ???The food that we bought just didn't last, and we didn't have money to get more.  Sometimes True      SNAP benefits: yes  SNAP $ per month: $138  Food pantry: no    Interventions used/offered: see above    Barriers to Care: none.    Time of Intervention: 10 minutes    Zachary DELENA Areola, MS, RD, LDN

## 2024-02-16 NOTE — Unmapped (Signed)
 COVID Education:  Make sure you perform good hand washing (lasting 20 seconds), continue to social distance and limit close personal contact (which may include new sexual partners or having multiple partners during this period).  Try to isolate at home but please find ways to keep in touch with those close to you, such as meeting up with them electronically or socially distanced, and the ability to go outdoors alone or separated from others  If you become ill with fever, respiratory illness, sudden loss of taste and smell, stomach issues, diarrhea, nausea, vomiting - contact clinic for further instructions.  You should go to the emergency department if you develop systems such as shortness of breath, confusion, lightheadedness when standing, high fever.   Here is some information about HIV and CoVid vaccines: [email or web address]  If you're interested in receiving the CoVid vaccine when you're eligible, here are some resources for you to check and make an appointment:  Your local health department   [email or web address] through [facility]  [email or web address]  [facility] (if you are an established patient with them)  [email or web address]    URGENT CARE  Please call ahead to speak with the nursing staff if you are in need of an urgent appointment.       MEDICATIONS  For refills please contact your pharmacy and ask them to electronically send or fax the request to the clinic.   Please bring all medications in original bottles to every appointment.    [facility] (formerly [facility]) or [employer] Eligibility (required even if you do not receive medication through [facility])  Please remember to renew your [employer] eligibility during renewal periods which occur twice a year: January-March and July-September.     The following are needed for each renewal:   - [place]  Identification (if you don't have one, then a bill with your name and address in [place] )   - proof of income (award letter, W-2, or last three check stubs)   If you are unable to come in for renewal, let us  know if we can mail, fax or e-mail paperwork to you.   [facility] Contact: [phone].     Lab info:  Your most recent CD4 T-cell counts and viral loads are below. Here are a few things to keep in mind when looking at your numbers:  Our goal is to get your virus to be undetectable and keep it undetectable. If the virus is undetectable you are much more likely to stay healthy.  We consider your viral load to be undetectable if it says <40 or if it says Not detected.  For most people, we're checking CD4 counts every other visit (once or twice a year, or sometimes even less).  It's normal for your CD4 count to be different from visit to visit.   You can help by taking your medications at about the same time, every single day. If you're having trouble with taking your medications, it's important to let us  know.    Lab Results   Component Value Date    ACD4 1,376 01/11/2024    CD4 43 01/11/2024    HIVCP <40 02/24/2014    HIVRS Not Detected 01/11/2024        Please note that your laboratory and other results may be visible to you in real time, possibly before they reach your provider. Please allow 48 hours for clinical interpretation of these results. Importantly, even if a result is flagged as abnormal, it may not be one that impacts your  health.    It was nice to have a visit with you today!  Follow-up information:        Provider today:  Madell Heino, FNP-BC      ID CLINIC address:   [facility]  [address]  [place], [place] [address]    Contact information:    The ID clinic phone number is [phone]   The ID clinic fax number is [phone]  For urgent issues on nights and weekends: Call the ID Physician on-call through the [facility] Operator at [phone].    Please sign up for My [facility] Chart - This is a great way to review your labs and track your appointments    Please try to arrive 30 minutes BEFORE your scheduled appointment time!  This will give you time to fill out any front desk paperwork needed for your visit, and allow you to be seen as close to your scheduled appointment time as possible.

## 2024-03-01 NOTE — Unmapped (Signed)
 Zachary Jennings (Clinic POC) has been contacted in regards to a refill of Cabenuva  [name]. At this time, the clinic has declined refill due to patient has transitioned to a different infusion clinic.

## 2024-04-07 DIAGNOSIS — B20 Human immunodeficiency virus [HIV] disease: Principal | ICD-10-CM

## 2024-05-05 DIAGNOSIS — B20 Human immunodeficiency virus [HIV] disease: Principal | ICD-10-CM

## 2024-06-02 DIAGNOSIS — B20 Human immunodeficiency virus [HIV] disease: Principal | ICD-10-CM

## 2024-06-17 NOTE — Progress Notes (Signed)
 Specialty Medication(s): Cabenuva     [name] has been dis-enrolled from the [facility] specialty pharmacy services as a result of greater than 3 months since last request for medication (this patient was previously enrolled in a Clinic Administered queue).    Additional information provided to the patient: n/a    Zachary Jennings  [facility] Specialty Technician

## 2024-06-30 DIAGNOSIS — B20 Human immunodeficiency virus [HIV] disease: Principal | ICD-10-CM
